# Patient Record
Sex: Female | Born: 1940 | Race: White | Hispanic: No | Marital: Married | Smoking: Former smoker
Health system: Southern US, Community
[De-identification: ages and names within clinical notes are randomized; demographics above are authoritative.]

## PROBLEM LIST (undated history)

## (undated) DIAGNOSIS — I10 Essential (primary) hypertension: Secondary | ICD-10-CM

## (undated) DIAGNOSIS — K5730 Diverticulosis of large intestine without perforation or abscess without bleeding: Secondary | ICD-10-CM

## (undated) DIAGNOSIS — F319 Bipolar disorder, unspecified: Secondary | ICD-10-CM

## (undated) DIAGNOSIS — N189 Chronic kidney disease, unspecified: Secondary | ICD-10-CM

## (undated) DIAGNOSIS — D126 Benign neoplasm of colon, unspecified: Principal | ICD-10-CM

## (undated) DIAGNOSIS — F329 Major depressive disorder, single episode, unspecified: Secondary | ICD-10-CM

## (undated) DIAGNOSIS — F32A Depression, unspecified: Secondary | ICD-10-CM

## (undated) HISTORY — DX: Chronic kidney disease, unspecified: N18.9

## (undated) HISTORY — PX: TOTAL KNEE ARTHROPLASTY: SHX125

## (undated) HISTORY — DX: Depression, unspecified: F32.A

## (undated) HISTORY — DX: Major depressive disorder, single episode, unspecified: F32.9

## (undated) HISTORY — PX: KNEE ARTHROSCOPY: SUR90

## (undated) HISTORY — DX: Bipolar disorder, unspecified: F31.9

## (undated) HISTORY — DX: Benign neoplasm of colon, unspecified: D12.6

## (undated) HISTORY — DX: Diverticulosis of large intestine without perforation or abscess without bleeding: K57.30

## (undated) HISTORY — PX: OTHER SURGICAL HISTORY: SHX169

---

## 1998-02-05 ENCOUNTER — Other Ambulatory Visit: Admission: RE | Admit: 1998-02-05 | Discharge: 1998-02-05 | Payer: Self-pay | Admitting: Obstetrics & Gynecology

## 1999-03-12 ENCOUNTER — Other Ambulatory Visit: Admission: RE | Admit: 1999-03-12 | Discharge: 1999-03-12 | Payer: Self-pay | Admitting: Obstetrics & Gynecology

## 1999-06-28 ENCOUNTER — Ambulatory Visit (HOSPITAL_COMMUNITY): Admission: RE | Admit: 1999-06-28 | Discharge: 1999-06-28 | Payer: Self-pay | Admitting: Obstetrics & Gynecology

## 1999-06-28 ENCOUNTER — Encounter (INDEPENDENT_AMBULATORY_CARE_PROVIDER_SITE_OTHER): Payer: Self-pay

## 2000-04-08 ENCOUNTER — Other Ambulatory Visit: Admission: RE | Admit: 2000-04-08 | Discharge: 2000-04-08 | Payer: Self-pay | Admitting: Obstetrics & Gynecology

## 2000-08-18 ENCOUNTER — Encounter: Payer: Self-pay | Admitting: Family Medicine

## 2000-08-18 ENCOUNTER — Ambulatory Visit (HOSPITAL_COMMUNITY): Admission: RE | Admit: 2000-08-18 | Discharge: 2000-08-18 | Payer: Self-pay | Admitting: Family Medicine

## 2000-10-14 ENCOUNTER — Ambulatory Visit (HOSPITAL_COMMUNITY): Admission: RE | Admit: 2000-10-14 | Discharge: 2000-10-14 | Payer: Self-pay | Admitting: Orthopedic Surgery

## 2000-12-22 ENCOUNTER — Encounter (HOSPITAL_COMMUNITY): Admission: RE | Admit: 2000-12-22 | Discharge: 2001-01-21 | Payer: Self-pay | Admitting: Orthopedic Surgery

## 2001-01-22 ENCOUNTER — Ambulatory Visit (HOSPITAL_COMMUNITY): Admission: RE | Admit: 2001-01-22 | Discharge: 2001-01-22 | Payer: Self-pay | Admitting: Family Medicine

## 2001-01-22 ENCOUNTER — Encounter: Payer: Self-pay | Admitting: Family Medicine

## 2001-02-02 ENCOUNTER — Encounter (HOSPITAL_COMMUNITY): Admission: RE | Admit: 2001-02-02 | Discharge: 2001-03-04 | Payer: Self-pay | Admitting: Orthopedic Surgery

## 2001-02-10 ENCOUNTER — Ambulatory Visit (HOSPITAL_COMMUNITY): Admission: RE | Admit: 2001-02-10 | Discharge: 2001-02-10 | Payer: Self-pay | Admitting: Neurosurgery

## 2001-02-15 ENCOUNTER — Encounter: Payer: Self-pay | Admitting: Neurosurgery

## 2001-02-15 ENCOUNTER — Ambulatory Visit (HOSPITAL_COMMUNITY): Admission: RE | Admit: 2001-02-15 | Discharge: 2001-02-15 | Payer: Self-pay | Admitting: Neurosurgery

## 2001-02-19 ENCOUNTER — Encounter: Payer: Self-pay | Admitting: Neurosurgery

## 2001-02-23 ENCOUNTER — Encounter (INDEPENDENT_AMBULATORY_CARE_PROVIDER_SITE_OTHER): Payer: Self-pay | Admitting: *Deleted

## 2001-02-23 ENCOUNTER — Inpatient Hospital Stay (HOSPITAL_COMMUNITY): Admission: RE | Admit: 2001-02-23 | Discharge: 2001-02-28 | Payer: Self-pay | Admitting: Neurosurgery

## 2001-02-23 ENCOUNTER — Encounter: Payer: Self-pay | Admitting: Neurosurgery

## 2001-05-17 ENCOUNTER — Other Ambulatory Visit: Admission: RE | Admit: 2001-05-17 | Discharge: 2001-05-17 | Payer: Self-pay | Admitting: Obstetrics & Gynecology

## 2002-05-19 ENCOUNTER — Other Ambulatory Visit: Admission: RE | Admit: 2002-05-19 | Discharge: 2002-05-19 | Payer: Self-pay | Admitting: Obstetrics & Gynecology

## 2002-12-27 ENCOUNTER — Encounter (HOSPITAL_COMMUNITY): Admission: RE | Admit: 2002-12-27 | Discharge: 2003-01-26 | Payer: Self-pay | Admitting: Neurosurgery

## 2003-01-27 ENCOUNTER — Encounter (HOSPITAL_COMMUNITY): Admission: RE | Admit: 2003-01-27 | Discharge: 2003-02-26 | Payer: Self-pay | Admitting: Neurosurgery

## 2003-05-30 ENCOUNTER — Other Ambulatory Visit: Admission: RE | Admit: 2003-05-30 | Discharge: 2003-05-30 | Payer: Self-pay | Admitting: Obstetrics & Gynecology

## 2004-06-18 ENCOUNTER — Other Ambulatory Visit: Admission: RE | Admit: 2004-06-18 | Discharge: 2004-06-18 | Payer: Self-pay | Admitting: Obstetrics & Gynecology

## 2004-07-05 ENCOUNTER — Ambulatory Visit: Payer: Self-pay | Admitting: Internal Medicine

## 2004-07-05 ENCOUNTER — Ambulatory Visit (HOSPITAL_COMMUNITY): Admission: RE | Admit: 2004-07-05 | Discharge: 2004-07-05 | Payer: Self-pay | Admitting: Internal Medicine

## 2005-04-02 ENCOUNTER — Encounter: Admission: RE | Admit: 2005-04-02 | Discharge: 2005-04-02 | Payer: Self-pay | Admitting: Neurosurgery

## 2006-04-09 ENCOUNTER — Encounter: Admission: RE | Admit: 2006-04-09 | Discharge: 2006-04-09 | Payer: Self-pay | Admitting: Neurosurgery

## 2006-08-24 ENCOUNTER — Inpatient Hospital Stay (HOSPITAL_COMMUNITY): Admission: RE | Admit: 2006-08-24 | Discharge: 2006-08-27 | Payer: Self-pay | Admitting: Orthopedic Surgery

## 2007-03-10 ENCOUNTER — Encounter: Admission: RE | Admit: 2007-03-10 | Discharge: 2007-03-10 | Payer: Self-pay | Admitting: Neurosurgery

## 2007-07-27 ENCOUNTER — Ambulatory Visit (HOSPITAL_COMMUNITY): Admission: RE | Admit: 2007-07-27 | Discharge: 2007-07-27 | Payer: Self-pay | Admitting: Family Medicine

## 2007-07-29 ENCOUNTER — Encounter: Payer: Self-pay | Admitting: Internal Medicine

## 2007-07-29 ENCOUNTER — Ambulatory Visit (HOSPITAL_COMMUNITY): Admission: RE | Admit: 2007-07-29 | Discharge: 2007-07-29 | Payer: Self-pay | Admitting: Internal Medicine

## 2007-07-29 ENCOUNTER — Ambulatory Visit: Payer: Self-pay | Admitting: Internal Medicine

## 2007-07-29 DIAGNOSIS — K5730 Diverticulosis of large intestine without perforation or abscess without bleeding: Secondary | ICD-10-CM

## 2007-07-29 DIAGNOSIS — D126 Benign neoplasm of colon, unspecified: Secondary | ICD-10-CM

## 2007-07-29 HISTORY — DX: Diverticulosis of large intestine without perforation or abscess without bleeding: K57.30

## 2007-07-29 HISTORY — DX: Benign neoplasm of colon, unspecified: D12.6

## 2008-02-23 ENCOUNTER — Encounter: Admission: RE | Admit: 2008-02-23 | Discharge: 2008-02-23 | Payer: Self-pay | Admitting: Neurosurgery

## 2008-06-05 ENCOUNTER — Ambulatory Visit (HOSPITAL_COMMUNITY): Admission: RE | Admit: 2008-06-05 | Discharge: 2008-06-05 | Payer: Self-pay | Admitting: Family Medicine

## 2008-12-05 ENCOUNTER — Encounter (HOSPITAL_COMMUNITY): Admission: RE | Admit: 2008-12-05 | Discharge: 2008-12-07 | Payer: Self-pay | Admitting: Family Medicine

## 2009-08-23 ENCOUNTER — Encounter: Admission: RE | Admit: 2009-08-23 | Discharge: 2009-08-23 | Payer: Self-pay | Admitting: Neurosurgery

## 2010-01-09 DIAGNOSIS — IMO0002 Reserved for concepts with insufficient information to code with codable children: Secondary | ICD-10-CM | POA: Insufficient documentation

## 2010-01-09 DIAGNOSIS — M949 Disorder of cartilage, unspecified: Secondary | ICD-10-CM

## 2010-01-09 DIAGNOSIS — M899 Disorder of bone, unspecified: Secondary | ICD-10-CM | POA: Insufficient documentation

## 2010-01-14 ENCOUNTER — Ambulatory Visit: Payer: Self-pay | Admitting: Internal Medicine

## 2010-01-14 DIAGNOSIS — K5900 Constipation, unspecified: Secondary | ICD-10-CM

## 2010-03-10 HISTORY — PX: COLONOSCOPY: SHX174

## 2010-04-01 ENCOUNTER — Encounter: Payer: Self-pay | Admitting: Family Medicine

## 2010-04-09 ENCOUNTER — Telehealth (INDEPENDENT_AMBULATORY_CARE_PROVIDER_SITE_OTHER): Payer: Self-pay

## 2010-04-11 NOTE — Assessment & Plan Note (Signed)
Summary: CONSTIPATION/JBB   Visit Type:  Initial Visit Primary Care Provider:  Mickie Hillier  CC:  constipation.  History of Present Illness: [name] is a pleasant 70 year old female who presents today with concerns regarding constipation. She states it started slowly over the past few months. She states that she felt like she was not evacuating completely; she had a very small BM daily, but still felt constipated. She felt "sluggish". Denied abdominal pain or bloating. She added fiber to her diet and started drinking 8-9 glasses of water a day for the past 2 weeks. Now states BM are productive and feels like she is evacuating completely. Tried Miralax and had good results. Reports one episode of a small amount of fresh blood in stool after straining; states she almost didn't see it. Not any on toilet paper. No weight loss. No loss of appetite.  Last colonoscopy Jul 29, 2007 by Dr. Gala Romney, showing normal rectum, left-sided divertiula, pedunculated polyps. Biopsy:benign, repeat in 3 years.  Current Medications (verified): 1)  Lithium 2)  Fosamax  Allergies (verified): 1)  ! * Pain Medicines  Past History:  Past Medical History: Hypotension Depression in past Chemical imbalance (unsure if bipolar or not) TC S Jul 29, 2007 by Dr. Gala Romney, showing normal rectum, left-sided divertiula, pedunculated polyps. Biopsy:benign,  Past Surgical History: knee arthroscopy knee replacement Spinal tumor removal  Family History: Mother:deceased, kidney disease Father: deceased, aortic aneurysm siblings: some type of colitis? No FH of Colon Cancer:  Social History: Patient is a former smoker. many years ago Alcohol Use - no Smoking Status:  quit  Review of Systems General:  Denies fever, chills, and anorexia. Eyes:  Denies blurring, irritation, and discharge. ENT:  Denies sore throat, hoarseness, and difficulty swallowing. CV:  Denies chest pains, dyspnea on exertion, and peripheral  edema. Resp:  Denies dyspnea at rest and wheezing. GI:  Complains of constipation; denies difficulty swallowing, pain on swallowing, nausea, vomiting, and abdominal pain. GU:  Denies urinary burning, blood in urine, and urinary frequency. MS:  Denies joint pain / LOM, joint swelling, and joint stiffness. Derm:  Denies rash, itching, and dry skin. Neuro:  Denies weakness, frequent falls, and headache. Psych:  Denies depression and anxiety. Endo:  Denies cold intolerance and heat intolerance. Heme:  Denies bruising and bleeding.  Vital Signs:  Patient profile:   70 year old female Height:      66 inches Weight:      134.50 pounds BMI:     21.79 Temp:     97.8 degrees F oral Pulse rate:   56 / minute BP sitting:   120 / 78  (left arm) Cuff size:   regular  Vitals Entered By: Waldon Merl LPN (November  7, 624THL 2:06 PM)  Physical Exam  General:  Well developed, well nourished, no acute distress. Head:  Normocephalic and atraumatic. Eyes:  without scleral icterus Mouth:  No deformity or lesions, dentition normal. Lungs:  Clear throughout to auscultation. Heart:  Regular rate and rhythm; no murmurs, rubs,  or bruits. Abdomen:  normal bowel sounds, without guarding, without rebound, no distesion, no tenderness, no masses, and no hepatomegally or splenomegaly.   Msk:  mild kyphosis, SMAE.  Pulses:  Normal pulses noted. Extremities:  No clubbing, cyanosis, edema or deformities noted. Neurologic:  Alert and  oriented x4;  grossly normal neurologically. Skin:  Intact without significant lesions or rashes. Psych:  Alert and cooperative. Normal mood and affect.  Impression & Recommendations:  Problem # 1:  CONSTIPATION (ICD-564.00)  [name] is a pleasant 70 year old female who presented with constipation over the past few months; she states she had a BM daily, but it was small and she felt "sluggish", felt like she was unable to evacuate completely. Denies abdominal pain/bloating,  N/V. Did have a small "streak" of brb in stool which she "barely saw" after straining during a BM. No other incidences of hematochezia, melena. Two weeks ago added fiber to diet, increased fluid intake to 8-9 glasses of water daily. Now states constipation is resolved. Next TCS due in 07/2010.   Colace 100 mg po BID Miralax prn Benefiber daily (samples given) TCS with Dr. Gala Romney in May 2012.  Contact us if constipation returns or any signs of rectal bleeding, abdominal pain, N/V.  Orders: Est. Patient Level III [id number])  Patient Instructions: 1)  Benefiber daily 2)  Colace 100 mg by mouth twice a day (hold if diarrhea) 3)  Use Miralax as needed for constipation 4)  Call us if constipation worsens or continues despite these interventions 5)  Continue drinking 8 glasses of water daily 6)  Continue exercising:)  7)  The medication list was reviewed and reconciled.  All changed / newly prescribed medications were explained.  A complete medication list was provided to the patient / caregiver.  Prescriptions: BENEFIBER  POWD (WHEAT DEXTRIN) 1 tbsp daily  #one month x 3   Entered and Authorized by:   Laban Emperor NP   Signed by:   Laban Emperor NP on 01/15/2010   Method used:   Faxed to ...       [facility]  [address] [phone]* (retail)       [address]       [place], [place]  [address]       Ph: [phone] or [phone]       Fax: [phone]   RxID:   [id number] MIRALAX  POWD (POLYETHYLENE GLYCOL 3350) take 1 capful as needed for constipation daily  #1 month x 3   Entered and Authorized by:   Laban Emperor NP   Signed by:   Laban Emperor NP on 01/15/2010   Method used:   Faxed to ...       [facility]  [address] [phone]* (retail)       [address]       [place], [place]  [address]       Ph: [phone] or [phone]       Fax: [phone]   RxID:   [id number] COLACE 100 MG CAPS (DOCUSATE SODIUM) take 1 by mouth two times a day  #60 x 3   Entered and Authorized by:    Laban Emperor NP   Signed by:   Laban Emperor NP on 01/15/2010   Method used:   Faxed to ...       [facility]  [address] [phone]* (retail)       [address]       [place], [place]  [address]       Ph: [phone] or [phone]       Fax: [phone]   RxID:   [id number]

## 2010-04-17 NOTE — Progress Notes (Addendum)
Summary: constipation  Phone Note Call from Patient Call back at Home Phone [phone]   Caller: Patient Summary of Call: pt called- she has tried all suggestions that AS gave her at her November ov. She is taking benefiber three times a day , colace and miralax daily, drinking 8 glasses of water a day and she swins at the [facility]. wants to know what her next step should be. please advise Initial call taken by: Burnadette Peter LPN,  January 31, [id number] 3:35 PM     Appended Document: constipation let's go ahead and set her up for a colonoscopy. She is due for one in May 2012 anyway. She may need to come back in for an updated H&P.   Appended Document: constipation I tried to call pt to schedule an ov,no answer,lmom.  Appended Document: constipation Per th pts request she is feeling fine and would like to wait until May to have her tcs.Marland Kitchen

## 2010-06-27 ENCOUNTER — Encounter: Payer: Self-pay | Admitting: Internal Medicine

## 2010-07-05 ENCOUNTER — Ambulatory Visit: Payer: Self-pay | Admitting: Urgent Care

## 2010-07-08 ENCOUNTER — Ambulatory Visit (INDEPENDENT_AMBULATORY_CARE_PROVIDER_SITE_OTHER): Payer: Medicare Other | Admitting: Urgent Care

## 2010-07-08 ENCOUNTER — Encounter: Payer: Self-pay | Admitting: Urgent Care

## 2010-07-08 VITALS — BP 138/86 | HR 82 | Temp 98.5°F | Ht 66.0 in | Wt 133.2 lb

## 2010-07-08 DIAGNOSIS — D126 Benign neoplasm of colon, unspecified: Secondary | ICD-10-CM

## 2010-07-08 DIAGNOSIS — K5900 Constipation, unspecified: Secondary | ICD-10-CM

## 2010-07-08 MED ORDER — PEG 3350-KCL-NA BICARB-NACL 420 G PO SOLR: ORAL | Status: AC

## 2010-07-08 NOTE — Assessment & Plan Note (Signed)
Much improved w/ fiber.  Doing well.

## 2010-07-08 NOTE — Progress Notes (Signed)
Primary Care Physician:  Rubbie Battiest, MD, MD Primary Gastroenterologist:  Dr. Gala Romney   Chief Complaint  Patient presents with  . Colon Cancer Screening    HPI:  [name] is a 70 y.o. female here to set up colonoscopy for hx tubular adenoma 2009.  Overall doing quite well.  Hx constipation.  Started benefiber.  No help.  C/o increased abd girth.  Taking metamucil--seems to help.  Denies rectal bleeding or melena.  BM daily. Wt stable.  Appetite ok.       Past Medical History  Diagnosis Date  . Hypotension   . Depression   . Tubular adenoma of colon 07/29/2007    Dr. Faith Rogue colonoscopy, previous focally adenomatous cecal polyp 2006  . Diverticulosis of colon 07/29/2007  . Bipolar affective disorder     Past Surgical History  Procedure Date  . Knee arthroscopy   . Total knee arthroplasty   . Spinal tumor removal     Current Outpatient Prescriptions  Medication Sig Dispense Refill  . alendronate (FOSAMAX) 10 MG tablet Take 10 mg by mouth daily before breakfast. Take with a full glass of water on an empty stomach.       . lithium 150 MG capsule Take 150 mg by mouth 3 (three) times daily with meals.        . Psyllium (METAMUCIL) 30.9 % POWD Take by mouth.        . docusate sodium (COLACE) 100 MG capsule Take 100 mg by mouth 2 (two) times daily.        . polyethylene glycol (MIRALAX / GLYCOLAX) packet Take 17 g by mouth daily.        . polyethylene glycol-electrolytes (TRILYTE) 420 G solution Use as directed Also buy 1 fleet enema & 4 dulcolax tablets to use as directed  4000 mL  0  . Wheat Dextrin (BENEFIBER DRINK MIX) PACK Take by mouth.          Allergies as of 07/08/2010 - Review Complete 07/08/2010  Allergen Reaction Noted  . Morphine and related Nausea And Vomiting 07/03/2010    Family History:There is no known family history of colorectal carcinoma , liver disease, or inflammatory bowel disease.   Problem Relation Age of Onset  . Kidney disease Mother   .  Colitis      ? type in sibling    History   Social History  . Marital Status: Married    Spouse Name: N/A    Number of Children: N/A  . Years of Education: N/A   Occupational History  . retired    Social History Main Topics  . Smoking status: Former Research scientist (life sciences)  . Smokeless tobacco: Not on file  . Alcohol Use: No  . Drug Use: No  . Sexually Active:    Review of Systems: Gen: Denies any fever, chills, sweats, anorexia, fatigue, weakness, malaise, weight loss, and sleep disorder CV: Denies chest pain, angina, palpitations, syncope, orthopnea, PND, peripheral edema, and claudication. Resp: Denies dyspnea at rest, dyspnea with exercise, cough, sputum, wheezing, coughing up blood, and pleurisy. GI: Denies vomiting blood, jaundice, and fecal incontinence.   Denies dysphagia or odynophagia. GU : Denies urinary burning, blood in urine, urinary frequency, urinary hesitancy, nocturnal urination, and urinary incontinence. MS: Denies joint pain, limitation of movement, and swelling, stiffness, low back pain, extremity pain. Denies muscle weakness, cramps, atrophy.  Derm: Denies rash, itching, dry skin, hives, moles, warts, or unhealing ulcers.  Psych: Denies depression, anxiety, memory loss, suicidal ideation,  hallucinations, paranoia, and confusion. Heme: Denies bruising, bleeding, and enlarged lymph nodes.  Physical Exam: BP 138/86  Pulse 82  Temp(Src) 98.5 F (36.9 C) (Tympanic)  Ht 5\' 6"  (1.676 m)  Wt 133 lb 3.2 oz (60.419 kg)  BMI 21.50 kg/m2 General:   Alert,  Well-developed, well-nourished, pleasant and cooperative elderly femalein NAD Head:  Normocephalic and atraumatic. Eyes:  Sclera clear, no icterus.   Conjunctiva pink. Ears:  Normal auditory acuity. Nose:  No deformity, discharge,  or lesions. Mouth:  No deformity or lesions, dentition normal. Neck:  Supple; no masses or thyromegaly. Lungs:  Clear throughout to auscultation.   No wheezes, crackles, or rhonchi. No acute  distress. Heart:  Regular rate and rhythm; no murmurs, clicks, rubs,  or gallops. Abdomen:  Soft, nontender and nondistended. No masses, hepatosplenomegaly or hernias noted. Normal bowel sounds, without guarding, and without rebound.   Rectal:  Deferred until time of colonoscopy.   Msk:  Symmetrical without gross deformities. Pulses:  Normal pulses noted. Extremities:  Without clubbing or edema. Neurologic:  Alert and  oriented x4;  grossly normal neurologically. Skin:  Intact without significant lesions or rashes. Cervical Nodes:  No significant cervical adenopathy. Psych:  Alert and cooperative. Normal mood and affect.

## 2010-07-08 NOTE — Progress Notes (Signed)
Cc to PCP 

## 2010-07-08 NOTE — Assessment & Plan Note (Signed)
[name] is a 70 y/o caucasian female w/ hx tubular adenoma colon 2009 & previous adenomatous polyp due for colonoscopy.  I have discussed risks & benefits which include, but are not limited to, bleeding, infection, perforation & drug reaction.  The patient agrees with this plan & written consent will be obtained.

## 2010-07-23 NOTE — H&P (Signed)
[name], [name]                 ACCOUNT NO.:  [id number]   MEDICAL RECORD NO.:  [id number]          PATIENT TYPE:  INP   LOCATION:  NA                           FACILITY:  [facility]   PHYSICIAN:  Gaynelle Arabian, M.D.    DATE OF BIRTH:  April 09, 1940   DATE OF ADMISSION:  08/24/2006  DATE OF DISCHARGE:                              HISTORY & PHYSICAL   CHIEF COMPLAINT:  Left knee pain.   HISTORY OF PRESENT ILLNESS:  The patient is a 70 year old female who was  seen by Dr. Wynelle Link for ongoing knee pain.  She has known end-stage  arthritis.  She has been treated conservatively in the past including  injections.  She has recently been treated with a series of Synvisc  injections and unfortunately, has had progressive worsening pain.  It is  felt she has reached a point where she could benefit undergoing surgical  intervention.  She would like to proceed with total knee arthroplasty.  Risks and benefits have been discussed.  She elected to proceed with  surgery.   ALLERGIES:  MORPHINE intolerances.  Vomiting with ANESTHESIA in the  past.   CURRENT MEDICATIONS:  Lithium and Fosamax.   PAST MEDICAL HISTORY:  1. Chemical imbalance/depression.  2. Osteopenia.  3. Degenerative disk disease.   PAST SURGICAL HISTORY:  1. Laser eye surgery for the left retina.  2. Arthroscopic left knee surgery.  3. T12 spinal tumor removal, benign, per Dr. Carloyn Manner in 2002.   SOCIAL HISTORY:  Married, former Pharmacist, hospital, nonsmoker, no alcohol.  Two  children.   FAMILY HISTORY:  Mother deceased with history of stroke.  Both parents  with history of arthritis.   REVIEW OF SYSTEMS:  GENERAL:  No fevers, chills, night sweats.  NEUROLOGIC:  History of depression/chemical imbalance, no seizures or  paralysis.  RESPIRATORY:  No shortness of breath, productive cough or  hemoptysis.  CARDIOVASCULAR:  No chest pain, angina, orthopnea.  GI:  No  nausea, vomiting, diarrhea or constipation.  GU:  No dysuria, hematuria  or  discharge.  MUSCULOSKELETAL:  Left knee.   PHYSICAL EXAMINATION:  VITAL SIGNS:  Pulse 80, respirations 14, blood  pressure 136/76.  GENERAL:  A 70 year old, white female, well-nourished, well-developed,  tall frame, thin, alert, oriented and cooperative.  HEENT:  Normocephalic, atraumatic.  Pupils round and reactive.  Oropharynx clear.  EOMs intact.  NECK:  Supple.  CHEST:  Clear.  HEART:  Regular rate and rhythm.  No murmur, S1-S2 noted.  ABDOMEN:  Soft, nontender.  Bowel sounds present.  BREASTS/RECTAL/GENITALIA:  Not done, not pertinent to present illness.  EXTREMITIES:  Left knee shows significant valgus malalignment deformity.  Range of motion of 5-120.  Marked crepitus is noted.   IMPRESSION:  Osteoarthritis, left knee.   PLAN:  The patient is admitted to [facility] to undergo a left  total knee replacement arthroplasty.  Surgery will be performed by Dr.  Gaynelle Arabian.      Alexzandrew L. Perkins, P.A.C.      Gaynelle Arabian, M.D.  Electronically Signed    ALP/MEDQ  D:  08/23/2006  T:  08/24/2006  Job:  [id number]   cc:   Margaretmary Eddy, M.D.  Fax: [phone]

## 2010-07-23 NOTE — Op Note (Signed)
[name], [name]                 ACCOUNT NO.:  [id number]   MEDICAL RECORD NO.:  [id number]          PATIENT TYPE:  AMB   LOCATION:  DAY                           FACILITY:  [facility]   PHYSICIAN:  R. Garfield Cornea, M.D. DATE OF BIRTH:  05-20-1940   DATE OF PROCEDURE:  07/29/2007  DATE OF DISCHARGE:  07/27/2007                               OPERATIVE REPORT   INDICATIONS FOR PROCEDURE:  A 70 year old lady with history of colonic  adenoma 2006.  She is here for surveillance.  This approach has been  discussed with the patient at length.  Potential risks, benefits and  alternatives have been reviewed.  Questions answered.  She is agreeable.  Please see documentation of medical record.   PROCEDURE NOTE:  O2 saturation, blood pressure, pulse, and respirations  were monitored throughout the entire procedure.  Conscious sedation  Versed 4 mg IV and Demerol 75 mg IV divided doses.  Zofran 4 mg IV, the  outset to prophylax against the postprocedure nausea.  Ampicillin 2 g  IV, gentamicin 90 mg IV for a new hip prosthesis less than 4 year old.   INSTRUMENTATION:  Pentax video chip system.   FINDINGS:  Digital rectal exam revealed no abnormalities and a scope was  placed.  The prep was adequate.  Colon:  Colonic mucosa was surveyed  from the rectosigmoid junction through the left transverse right colon  to the area of appendiceal orifice, ileocecal valve, and cecum.  These  structures were well seen and photographed for the record.  From this  level, the scope was slowly cautiously withdrawn.  All previous mucosal  surfaces were again seen.  The patient was noted to have a lobulated  pedunculated 1-cm polyp in the ascending colon up to the ileocecal  valve.  Please see photos.  There was a second 5-mm pedunculated polyp  just downstream of the larger lesion.  A smaller lesion was cold snared  and lost in the recovery process.  The larger polyp was essentially  removed with one pass hot snare  cautery and recovered through the scope.  There was a small amount of residual polyp along the posterior side on  back of the fold on which the polyp was straddling.  This was shaved  down with hot snare cautery and couple of tiny areas were ablated with  the tip of hot snare cautery.  This lesion was felt to have been removed  and destroyed completely.  There was some oozing noted from the center  of the polypectomy crater.  I did not feel additional energy and this  thin walled segment would be the safest approach.  I obtained a single-  resolution clip and applied it to the base of the polypectomy site with  excellent hemostasis.  From this level, the scope was slowly withdrawn  and previously mentioned mucosal surfaces were all once again seen and  no other abnormalities were observed.  The scope was pulled down the  rectum where thorough examination of the rectal mucosa including  retroflexed view of the anal verge demonstrated no abnormalities.  The  patient tolerated the procedure and was reactive to endoscopy.   IMPRESSION:  1. Normal rectum.  2. Left-sided diverticula.  3. Pedunculated polyps about the ileocecal valve treated as described      above.   RECOMMENDATIONS:  1. No arthritis medications for 10 days.  2. Diverticulosis.  Polyp literature provided to [name].  3. No MRI until chip known to have passed.  4. Followup on path.  5. Further recommendations to follow.      Bridgette Habermann, M.D.  Electronically Signed     RMR/MEDQ  D:  07/29/2007  T:  07/29/2007  Job:  [id number]

## 2010-07-23 NOTE — Op Note (Signed)
[name], Lewis                 ACCOUNT NO.:  [id number]   MEDICAL RECORD NO.:  [id number]          PATIENT TYPE:  INP   LOCATION:  X009                         FACILITY:  [facility]   PHYSICIAN:  Gaynelle Arabian, M.D.    DATE OF BIRTH:  1941/03/02   DATE OF PROCEDURE:  08/24/2006  DATE OF DISCHARGE:                               OPERATIVE REPORT   PREOPERATIVE DIAGNOSIS:  Osteoarthritis left knee.   POSTOPERATIVE DIAGNOSIS:  Osteoarthritis left knee.   PROCEDURE:  Left total knee arthroplasty.   SURGEON:  Gaynelle Arabian, M.D.   ASSISTANT:  Arlee Muslim PA-C   ANESTHESIA:  General with postop Marcaine pain pump.   ESTIMATED BLOOD LOSS:  Minimal.   DRAIN:  None.   TOURNIQUET TIME:  38 minutes at 300 mmHg.   COMPLICATIONS:  None.   CONDITION:  Stable to recovery.   BRIEF CLINICAL NOTE:  [name] is a 70 year old female who has end-  stage valgus arthritis of the left knee with progressively worsening  pain dysfunction and deformity.  She has failed nonoperative management  including injections and presents for total knee arthroplasty.   PROCEDURE IN DETAIL:  After successful administration of general  anesthetic, a tourniquet placed on the left thigh and left lower  extremity prepped and draped in usual sterile fashion.  Extremities  wrapped in Esmarch, knee flexed, tourniquet inflated 300 mmHg.  Midline  incision made with 10 blade through subcutaneous tissue to the level of  the extensor mechanism.  Given her significant valgus deformity, we made  a lateral parapatellar arthrotomy.  Soft tissue of the proximal lateral  tibia subperiosteally elevated around the joint line with the knife and  out the posterolateral corner with a Cobb elevator.  Soft tissue  medially was left intact.  Patella was everted medially, knee flexed 90  degrees, ACL, PCL removed.  Drill was used to create a starting hole in  the distal femur and the canal was thoroughly irrigated.  5 degrees  left  valgus alignment guide was placed referencing off the posterior  condyles, rotations marked and the block pinned to remove 10 mm of the  distal femur.  Distal femoral resection is made with an oscillating saw.  Sizing block was placed and size 4 was most appropriate in AP plane but  3 most appropriate medial and lateral.  We did a size 3 cutting block  off the size 4 holes and rotated off the epicondylar axis.  The anterior-  posterior and chamfer cuts were subsequently made.   Tibia subluxed forward and the menisci removed.  Extramedullary tibial  alignment guides placed referencing proximally at the medial aspect of  tibial tubercle and distally along the second metatarsal axis tibial  crest.  The blocks pinned to remove 2 mm off the defective lateral side.  Tibial resection is made with an oscillating saw.  Size 3 is most  appropriate tibial component and the proximal tibia prepared with the  modular drill and keel punch for the size 3.  Femoral preparation is  completed the intercondylar cut.   Size 3 mobile  bearing tibial trial, size 3 posterior stabilized femoral  trial and a 12.5 mm posterior stabilized rotating platform insert trial  are placed.  A 12/05 full extensions achieved with excellent varus  valgus balance throughout full range of motion.  Patella was everted,  thickness measured to be 22 mm.  Freehand resection is taken to 13 mm,  35 template placed, lug holes were drilled, trial patella was placed and  it tracks normally.  Osteophytes removed off the posterior femur with  the trial placed.  All trials removed and the cut bone surfaces were  prepared with pulsatile lavage.  Cement was mixed.  Once ready for  implantation, size 3  mobile bearing tibial tray size 3 posterior  stabilized femur and 35 patella are cemented into place and patella was  held with a clamp.  Trial 12.5-mm inserts placed, knee held in full  extension and all extruded cement removed.  Once  cement was fully  hardened then we thoroughly irrigated the joint pulsatile lavage and  injected FloSeal onto the posterior capsule.  The permanent 12.5 mm  posterior stabilized rotating platform insert is then placed into the  tibial tray.  Wound is copiously irrigated saline solution and the  FloSeal injected into medial lateral gutters and suprapatellar area.  The tourniquet released with total time of 38 minutes.  This moist  sponge is held on the knee and then after two minutes and removed and  minimal bleeding is noted.  Any bleeding identified was stopped with  cautery.  We further irrigated and closed the arthrotomy with  interrupted #1 PDS.  The small area from the superior to inferior pole  of patella was left open for a mini release.  Patella tracks normally  and flexion against gravity to 145 degrees.  Subcu closed interrupted 2-  0 Vicryl subcuticular running 4-0 Monocryl.  Catheter for Marcaine pain  pump is placed and the pump was initiated.  Steri-Strips and bulky  sterile dressing are applied.  She is placed into a knee immobilizer,  awakened, transported to recovery in stable condition.      Gaynelle Arabian, M.D.  Electronically Signed     FA/MEDQ  D:  08/24/2006  T:  08/25/2006  Job:  [id number]

## 2010-07-26 NOTE — Op Note (Signed)
[place]. [facility]  Patient:    [name], [name] Visit Number: [id number] MRN: [id number]          Service Type: SUR Location: [place] 05 Attending Physician:  Melton Krebs Dictated by:   Elizabeth Sauer, M.D. Proc. Date: 02/23/01 Admit Date:  02/23/2001                             Operative Report  PREOPERATIVE DIAGNOSIS:  Right-sided T12-L1 tumor.  POSTOPERATIVE DIAGNOSIS:  Right-sided T12-L1 tumor.  OPERATION: T12-L1 laminectomy for tumor resection.  SURGEON:   Elizabeth Sauer, M.D.  DOCTOR ASSISTANT:  Zigmund Daniel. Joya Salm, M.D.  NURSE ASSISTANT:  [place]  SERVICE:  Neurosurgery.  ANESTHESIA:  General endotracheal anesthesia.  PREP:    In sterile manner and scrubbed with alcohol wipe.  COMPLICATIONS:  None.  INDICATIONS:  This is a 70 year old right-handed white lady with T12-L1 tumor extramedullary intradural on the right side.  DESCRIPTION OF PROCEDURE:  She was taken to the operating suite, intubated, and placed prone on the operating table.  Following shave, prep, and drape in the usual sterile fashion, skin was infiltrated with 1% lidocaine with 4:100,000 epinephrine.  A mark was placed and intraoperative x-ray obtained to confirm correctness of level of incision.  The incision was made from the bottom of L1 to mid T11.  The lamina were uncovered, and the laminectomy of L1, T12, and part of T11 was carried out.  Hemostasis assured.  The dura was exposed, and intraoperative ultrasound was used to locate the tumor.  The dura was then opened and tacked back with 4-0 Nurolon.  The tumor was identified. The dentate ligament had a stitch placed in two placed to slightly rotate the cord in a counterclockwise fashion, looking from the bottom up.  This isolated the tumor.  The tumor was then resected piecemeal and a piece sent off for frozen that was found to be a meningioma.  The tumor base was identified and carefully coagulated.  Meticulous  hemostasis was assured.  The wound was irrigated and hemostasis assured. The dura was closed in watertight fashion with 6-0 Prolene in a running fashion.  Valsalva did not show any CSF leak. meticulous hemostasis of the epidural space was then undertaken.  The wound was copiously irrigated.  Hemostasis was assured.  The paraspinous muscles were reapproximated with Vicryl in interrupted fashion.  Subcutaneous tissues were reapproximated with 0 Vicryl in interrupted fashion.  Subcuticular tissues were reapproximated with 3-0 Vicryl in interrupted fashion.  The skin was closed with 3-0 nylon in running lock fashion.  Betadine and Telfa dressing were applied.  The dissection of the tumor itself had been carried out under the microscope using microdissection technique.  Dictated by:   Elta Guadeloupe . Carloyn Manner, M.D. Attending Physician:  Melton Krebs DD:  02/23/01 TD:  02/23/01 Job: [id number]

## 2010-07-26 NOTE — Op Note (Signed)
[name], [name]                 ACCOUNT NO.:  [id number]   MEDICAL RECORD NO.:  [id number]          PATIENT TYPE:  AMB   LOCATION:  DAY                           FACILITY:  [facility]   PHYSICIAN:  R. Garfield Cornea, M.D. DATE OF BIRTH:  [birth year]   DATE OF PROCEDURE:  07/05/2004  DATE OF DISCHARGE:                                 OPERATIVE REPORT   PROCEDURE:  Colonoscopy with biopsy and snare polypectomy.   INDICATION FOR PROCEDURE:  The patient is a 70 year old Caucasian female  referred over by W. Rosemary Holms, M.D., for colorectal cancer screening.  She has never had a colonoscopy.  She has no lower GI tract symptoms.  There  is no family history of colorectal neoplasia.  Colonoscopy is now being  discussed as a screening maneuver.  This approach has been discussed with  the patient at length, the potential risks, benefits, and alternatives have  been reviewed, questions answered.  She is agreeable.  Please see the  documentation in the medical record.   PROCEDURE NOTE:  O2 saturation, blood pressure, pulse, and respiration were  monitored throughout the entire procedure.   CONSCIOUS SEDATION:  Versed 4 mg IV, Demerol 75 mg IV.  Antibiotic dose is  125 mg IV prior to the procedure, and Benadryl 25 mg IV for skin streaking  at the IV site.   INSTRUMENT USED:  Olympus video chip system (pediatric colonoscope).   FINDINGS:  Digital exam revealed no abnormalities.  Endoscopic findings:  Prep was adequate.   Rectum:  Examination of the rectal mucosa including a retroflexed view of  the anal verge revealed no abnormalities.   Colon:  The colonic mucosa was surveyed from the rectosigmoid junction  through the left, transverse and right colon to the area of the appendiceal  orifice, ileocecal valve and cecum.  These structures were well-seen and  photographed for the record.  From this level the scope was slowly withdrawn  and all previously-mentioned mucosal surfaces were again  seen.  The patient  was noted to have a 6 mm polyp on a stalk opposite the ileocecal valve of  the cecum.  It was removed with snare and recovered through the scope.  There was also a 4 mm diminutive polyp at the rectosigmoid at 20 cm, which  was cold biopsied/removed.  The patient also had left-sided diverticula.  The remainder of the colonic mucosa appeared normal.  The patient tolerated  the procedure well and was reacted in endoscopy.   IMPRESSION:  1.  Normal rectum.  2.  Diminutive rectosigmoid polyp, removed with cold biopsy forceps.  3.  Pedunculated polyp opposite ileocecal valve at the cecum, removed with      snare cautery as described above.  4.  Left-sided diverticula.  5.  The remainder of the colonic mucosa appeared normal.   RECOMMENDATIONS:  1.  Diverticulosis literature provided to [name].  2.  No aspirin or arthritis medication for the next 10 days.  3.  Follow up on pathology.  4.  Further recommendations to follow.      RMR/MEDQ  D:  07/05/2004  T:  07/05/2004  Job:  [id number]   cc:   Margaretmary Eddy, M.D.  [address] [place] [address]  Fax: [phone]

## 2010-07-26 NOTE — Op Note (Signed)
[facility]  Patient:    [name], [name]                        MRN: [id number] Proc. Date: 06/28/99 Adm. Date:  [id number] Attending:  [place] Dec                           Operative Report  PREOPERATIVE DIAGNOSES:       1. Postmenopausal bleeding on hormone replacement                                  therapy.                               2. Suspected polyp as noted on previous ultrasound.  POSTOPERATIVE DIAGNOSES:      1. Postmenopausal bleeding on hormone replacement                                  therapy.                               2. Suspected polyp as noted on previous ultrasound.                               3. Pathology pending.  PROCEDURE:                    Examination under anesthesia and dilatation and curettage.  SURGEON:                      Cristopher Estimable. Stann Mainland, M.D.  ANESTHESIA:                   Intravenous conscious sedation, paracervical block with 4% Xylocaine without epinephrine.  PROCEDURE:                    Patient was taken to the operating room where after the induction of intravenous conscious sedation she was prepped and draped having been placed in the modified lithotomy position in short island stirrups. Bladder was drained of clear urine during the prep.                                After she was appropriately draped, examination under anesthesia was carried out with findings consistent with a mobile, normal sized  uterus and no unusual adnexal masses were felt.                                At this time a speculum was placed and a single tooth tenaculum placed on the anterior lip of the cervix and a paracervical block was  carried out using approximately 12 cc of 1% Xylocaine without epinephrine. Thereafter, the internal os of the cervix was dilated gently to a # 23 Pratt dilator and thereafter using the Randall stone forceps the cavity was probed and what appeared to be a 1 cm polyp was  removed.  At this time  using the small serrated curette, the walls of the uterus were curetted in a general, thorough, and systematic fashion.  Minimal tissue was produced.  The site where the polyp was  attached on the posterior wall of the uterus was curetted well and was almost smooth after conclusion of the sampling.  All specimens were sent labeled "endometrial curetting-probably polyp included."  At this time all instruments ere removed.  Patient was again examined and thereafter awakened and transported to  recovery room in satisfactory condition having tolerated procedure well.                                Patient will return to the office in approximately two weeks time and will remain off of hormone replacement therapy.  Further hormone replacement therapy options will be discussed on her follow-up in the office in approximately two weeks time.  In view of her recent bone scan it is possible that something like Evista might be considered rather than hormone replacement therapy.                                Patient will be discharged to home and given a detailed instruction sheet and will use Advil or Aleve as needed for discomfort. Condition on arrival in recovery room satisfactory. DD:  06/28/99 TD:  06/29/99 Job: [id number]

## 2010-07-26 NOTE — Discharge Summary (Signed)
[place]. [facility]  Patient:    [name], [name] Visit Number: [id number] MRN: [id number]          Service Type: SUR Location: [id number] Attending Physician:  Melton Krebs Dictated by:   Earleen Newport, M.D. Admit Date:  02/23/2001 Discharge Date: 02/28/2001                             Discharge Summary  ADMISSION DIAGNOSIS:  Spinal tumor.  DISCHARGE DIAGNOSIS:  T12 meningioma.  CONDITION ON DISCHARGE:  Improving.  HOSPITAL COURSE:  The patient is a 70 year old individual who was found to have progressive difficulty with right hip pain without weakness.  She found that pain was worse when she would lay down.  Prednisone seemed to help.  An MRI was performed.  This demonstrated presence of a lesion within the spinal canal at the T12 level.  She was taken to the operating room on February 23, 2001, where she had resected a meningioma from around the area of the conus. Postoperatively, the patient complained of some pain initially which was dysesthetic involving the right lower extremity, and this was treated with Neurontin 300 mg t.i.d.  This seemed to help the pain considerably.  She was also given some Skelaxin for spasms.  At the time of discharge, she is being tapered from Decadron, and is currently at 2 mg b.i.d., which will be tapered for the next two weeks time.  Her incision is clean and dry.  Sutures will be removed in a little over a weeks time.  She is also given a prescription for Neurontin 300 mg t.i.d. for pain control.  She has not required any narcotic pain medication during the last days of the hospitalization.  CONDITION ON DISCHARGE:  Improving. Dictated by:   Earleen Newport, M.D. Attending Physician:  Melton Krebs DD:  02/28/01 TD:  03/01/01 Job: [id number]

## 2010-07-26 NOTE — Discharge Summary (Signed)
Lewis, [name]                 ACCOUNT NO.:  [id number]   MEDICAL RECORD NO.:  [id number]          PATIENT TYPE:  INP   LOCATION:  [place]                         FACILITY:  [facility]   PHYSICIAN:  Gaynelle Arabian, M.D.    DATE OF BIRTH:  15-Apr-1940   DATE OF ADMISSION:  08/24/2006  DATE OF DISCHARGE:  08/27/2006                               DISCHARGE SUMMARY   ADMITTING DIAGNOSES:  1. Osteoarthritis, left knee.  2. Chemical imbalance/depression.  3. Osteopenia.  4. Degenerative disk disease.   DISCHARGE DIAGNOSES:  1. Osteoarthritis, left knee, status post left total knee replacement      arthroplasty.  2. Mild postop blood loss anemia.  3. Osteopenia.  4. Degenerative disk disease.   PROCEDURE:  On August 24, 2006, left total knee surgery by Dr. Wynelle Link,  assistant Alexzandrew L. Perkins, P.A.C.   CONSULTATIONS:  None.   HISTORY OF PRESENT ILLNESS:  [name] is a 70 year old female with end-  stage valgus arthritis of the left knee with progressive worsening pain  dysfunction, failed operative management, now presents for a total knee  arthroplasty.   LABORATORY DATA:  Preop CBC showed a hemoglobin of 14.1, hematocrit  42.4, white cell count 6.  Postop hemoglobin 10.6 up to 11.1.  Last H&H  back down to 9.6 and 28.5.  PT/PTT on admission 12.8 and 32  respectively.  INR 1.0.  Serial pro times followed with PT/INR 25 and  2.1.  Chem panel on admission all within normal limits.  Serial BMETs  were followed.  Electrolytes remained within normal limits.  Preop UA  with small leukocyte esterase, few epithelials, 0-2 rbc's, 0-2 wbc's,  otherwise negative.  Blood group type O+.   EKG on August 17, 2006, in normal sinus rhythm, right superior axis  deviation, pulmonary disease pattern since previous EKG rate is faster  performed by Dr. Dorris Carnes on October 07, 2006, in normal sinus rhythm.   HOSPITAL COURSE:  The patient admitted to [facility] and  tolerated the procedure  well.  Later, she was transferred to recovery  room then orthopedic floor.  She was started on PCA and p.o. analgesics,  pain control following surgery.  She was given 24 hours postoperative IV  antibiotics.  She was given Coumadin for DVT prophylaxis.  She did  pretty well on the evening of surgery and doing pretty well on the  morning of day #1 which is not much sleep.  Marcaine pain pump placed at  the time of surgery accidentally was pulled out.  She started getting up  out of bed with therapy by day #2.  She was doing a little bit better  except for being in the CPM too long on the evening of day #1.  Dressing  was changed.  Incision looked good.  Weaned over to p.o. medications for  the PCA, IVs and also the Foley was discontinued at that time.  Continued to get up with PT and she was up ambulating distances about 70  feet.  Later that afternoon, she went over 100 feet.  She progressed  very well and was ready to go home by the following day of August 27, 2006.   DISPOSITION:  The patient was discharged home on August 27, 2006.   DISCHARGE MEDICATIONS:  Vicodin, Robaxin, Coumadin, Phenergan and Nu-  Iron.   ACTIVITY:  Weightbearing as tolerated, left lower extremity.  Home  health PT, home health nursing with total knee protocol.   FOLLOW UP:  Follow up on Wednesday, July 2.   CONDITION ON DISCHARGE:  Improved.      Alexzandrew L. Perkins, P.A.C.      Gaynelle Arabian, M.D.  Electronically Signed    ALP/MEDQ  D:  09/17/2006  T:  09/18/2006  Job:  [id number]   cc:   Margaretmary Eddy, M.D.  Fax: [phone]

## 2010-07-26 NOTE — H&P (Signed)
[place]. [facility]  Patient:    [name], [name] Visit Number: [id number] MRN: [id number]          Service Type: Attending:  Elizabeth Sauer, M.D. Dictated by:   Elizabeth Sauer, M.D. Adm. Date:  02/23/01                           History and Physical  ADMITTING DIAGNOSIS:  Spine tumor.  SERVICE:  Neurosurgery.  HISTORY OF PRESENT ILLNESS:  A 70 year old right-handed white lady, a couple of months had right hip pain, did not notice any numbness or tingling in her leg but does not have any weakness.  It is worse when she lies down.  Her husband said she had a bout of this about a year ago and it resolved.  She was on prednisone for a while and it seemed to help.  Because of this an MRI was obtained that shows an intraspinal extramedullary lesion at the T12-L1 level that is uniformly enhancing.  This is consistent with either neurofibroma or meningioma.  MEDICAL HISTORY:  Remarkable for a chemical imbalance for which she is on lithium 300 mg t.i.d.  Her other medicine is Fosamax once a week.  ALLERGIES:  No allergies.  SURGICAL HISTORY:  Knee arthroscopy in August 2002.  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  FAMILY HISTORY:  Mom died at 35 and daddy died at 77-and-a-half; causes were not given.  REVIEW OF SYSTEMS:  Remarkable for back pain, neck pain, and anxiety.  PHYSICAL EXAMINATION:  HEENT:  Within normal limits.  NECK:  She seems to have good range of motion of her neck.  CHEST:  Clear.  CARDIAC:  Regular rate and rhythm.  ABDOMEN:  Nontender, no hepatosplenomegaly.  EXTREMITIES:  Without clubbing or cyanosis.  GENITOURINARY:  Deferred.  PERIPHERAL PULSES:  Good.  NEUROLOGIC:  She is awake, alert, and oriented.  Her cranial nerves are intact.  Motor exam shows 5/5 strength throughout the upper and lower extremities.  No current sensory deficit.  Reflexes are 2 at the knees, 2 at the ankles.  Toes  downgoing bilaterally.  Straight leg raise is negative.  LABORATORY DATA:  MRI demonstrates a uniformly-enhancing mass in the spinal canal on the right side.  It appears to be intradural and extramedullary.  In addition on the left side in the extraspinal space there are some enhancing areas.  There were studied and found to not be neurofibromas.  CLINICAL IMPRESSION:  Either neurofibroma or meningioma.  PLAN:  Laminectomy and removal of tumor.  The risks and benefits of this approach have been discussed with her and she wishes to proceed. Dictated by:   Elizabeth Sauer, M.D. Attending:  Elizabeth Sauer, M.D. DD:  02/23/01 TD:  02/23/01 Job: [id number]

## 2010-07-26 NOTE — Op Note (Signed)
[facility]  Patient:    [name], [name]                        MRN: [id number] Proc. Date: 10/14/00 Adm. Date:  [id number] Attending:  Tarri Glenn Page                           Operative Report  PREOPERATIVE DIAGNOSES: 1. Torn lateral meniscus. 2. Osteoarthritis, left knee.  POSTOPERATIVE DIAGNOSES: 1. Torn medial and lateral menisci. 2. Osteoarthritis, left knee.  OPERATION: 1. Left knee arthroscopy with one partial medial and lateral meniscectomy. 2. Shaving of lateral femoral condyle.  SURGEON:  Laurice Record. Aplington, M.D.  ASSISTANT:  Nurse.  ANESTHESIA:  General.  PATHOLOGY AND JUSTIFICATION FOR PROCEDURE:  She has had pain and swelling in her left knee.  Plain x-rays demonstrate lateral compartment narrowing.  MRI has demonstrated torn lateral meniscus.  It is felt that an arthroscopic procedure hopefully will improve her left knee, and she understands that this is not being done for arthritis but is being done for the torn lateral meniscus.  See operative description below for additional findings and description of pathology.  DESCRIPTION OF PROCEDURE:  Satisfactory general anesthesia, pneumatic tourniquet, thigh stabilizer.  The left knee was prepped with Dura-Prep and draped in a sterile field.  Superior medial saline inflow.  Joint fluid came forth out of the joint on puncturing the joint.  First, through an anterolateral portal of the medial compartment, the knee joint was evaluated. There was minimal wear of the medial femoral condyle.  There was a good bit of synovitis anteriorly which I pictured and resected.  She had little tiny tear transverse to the inner rim, the junction, anterior and mid third which I shaved down until smooth.  There was a little irregularity of the posterior curve which I did not feel required treatment.  The remainder of the meniscus was unremarkable.  Looking up in the medial gutter and suprapatellar  area, she had some moderate wear of the patella but nothing that was shavable, and no other abnormalities were noted.  I then reversed portals.  Her ACL was intact. She had a bucket-handle-type tear of the anterior third of the lateral meniscus as well as significant tearing of the entire remaining inner rim. 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  The anterior tear was treated by resection of most of the fragment with scissors and then shaving down the remaining rim with the 3.5 shaver.  Posteriorly, I trimmed the meniscus back to a stable rim with baskets and then shaved it down with the 3.5 shaver as well.  Final pictures were taken.  The knee joint was then irrigated until clear and all fluid possible removed.  The two anterior portals were closed with 4-0 nylon.  Then, 20 cc 0.5% Marcaine with adrenalin and 4 mg of morphine were instilled through the inflow apparatus which was removed and this portal closed with 4-0 nylon as well.  Betadine and Adaptic dry sterile dressing were applied.  Tourniquet was released.  She tolerated the procedure well and was taken to the recovery room in satisfactory condition with no known complications. DD:  10/14/00 TD:  10/15/00 Job: [id number]

## 2010-07-29 ENCOUNTER — Other Ambulatory Visit: Payer: Self-pay | Admitting: Internal Medicine

## 2010-07-29 ENCOUNTER — Encounter: Payer: Medicare Other | Admitting: Internal Medicine

## 2010-07-29 ENCOUNTER — Ambulatory Visit (HOSPITAL_COMMUNITY)
Admission: RE | Admit: 2010-07-29 | Discharge: 2010-07-29 | Disposition: A | Discharge status: Home / Self Care | Payer: Medicare Other | Source: Ambulatory Visit | Attending: Internal Medicine | Admitting: Internal Medicine

## 2010-07-29 DIAGNOSIS — D126 Benign neoplasm of colon, unspecified: Secondary | ICD-10-CM

## 2010-07-29 DIAGNOSIS — Z860100 Personal history of colon polyps, unspecified: Secondary | ICD-10-CM | POA: Insufficient documentation

## 2010-07-29 DIAGNOSIS — K5909 Other constipation: Secondary | ICD-10-CM | POA: Insufficient documentation

## 2010-07-29 DIAGNOSIS — K5900 Constipation, unspecified: Secondary | ICD-10-CM

## 2010-07-29 DIAGNOSIS — K5730 Diverticulosis of large intestine without perforation or abscess without bleeding: Secondary | ICD-10-CM

## 2010-07-30 NOTE — Op Note (Signed)
  [name], [name]                 ACCOUNT NO.:  [id number]  MEDICAL RECORD NO.:  [id number]           PATIENT TYPE:  O  LOCATION:  [place]                          FACILITY:  [facility]  PHYSICIAN:  R. Garfield Cornea, M.D. DATE OF BIRTH:  03-31-1940  DATE OF PROCEDURE:  07/29/2010 DATE OF DISCHARGE:                              OPERATIVE REPORT   PROCEDURE:  Surveillance ileocolonoscopy, colonoscopy with snare polypectomy.  INDICATIONS FOR PROCEDURE:  A 70 year old lady with history of colonic adenomas 2009, here for surveillance.  She has occasional constipation. She had recently started taking Metamucil daily with normalization of bowel function, she has 1 bowel movement daily, has any rectal bleeding, etc.  Colonoscopy is now being done.  Risks, benefits, limitations, alternatives, imponderables have been discussed, questions answered. Please see the documentation in the medical record.  PROCEDURE NOTE:  O2 saturation, blood pressure, pulse, respirations were monitored throughout the entirety of the procedure.  CONSCIOUS SEDATION:  Versed 6 mg IV, Demerol 50 mg IV in divided doses.  INSTRUMENT:  Pentax video chip system.  FINDINGS:  Digital rectal exam revealed no abnormalities.  Endoscopic findings:  Prep was adequate.  Colon:  Colonic mucosa was surveyed from the rectosigmoid junction through the left transverse right colon to the appendiceal orifice, ileocecal valve/cecum.  Terminal ileum was also intubated to 5 cm.  Multiple photographs were taken, however, because of equipment/server failure, the photographs could not be stored.  From this level, scope was slowly and cautiously withdrawn.  All previously mentioned mucosal surfaces were again seen on the way in and there was 8 mm pedunculated polyp at the splenic flexure which was hot snared, removed, and recovered through the scope.  The patient had scattered left-sided diverticula.  Colon was somewhat tortuous,  redundant requiring external abdominal pressure reached the cecum.  The remainder of colonic mucosa appeared normal.  Scope was pulled down the rectum where a thorough examination of rectal mucosa including retroflexion of the anal verge demonstrated no abnormalities.  The patient tolerated the procedure well.  Cecal withdrawal time 7 minutes.  IMPRESSION: 1. Normal rectum. 2. Left-sided diverticula somewhat tortuous colon. 3. Pedunculated polyp, splenic flexure status post hot snare     polypectomy. 4. Normal terminal ileum.  RECOMMENDATIONS: 1. Polyp and diverticulosis literature provided to [name]. 2. Follow up on path. 3. Recommendations to follow.     Bridgette Habermann, M.D.     RMR/MEDQ  D:  07/29/2010  T:  07/29/2010  Job:  [id number]  cc:   Margaretmary Eddy, M.D. [phone]  Electronically Signed by Jannette Spanner M.D. on 07/30/2010 07:40:49 AM

## 2010-12-25 LAB — TYPE AND SCREEN: Antibody Screen: NEGATIVE

## 2010-12-25 LAB — CBC
HCT: 33.3 — ABNORMAL LOW
Hemoglobin: 11.1 — ABNORMAL LOW
Hemoglobin: 9.6 — ABNORMAL LOW
MCHC: 33.2
MCHC: 33.6
MCV: 96
RBC: 2.97 — ABNORMAL LOW
RBC: 3.46 — ABNORMAL LOW
WBC: 8.9

## 2010-12-25 LAB — BASIC METABOLIC PANEL
BUN: 10
BUN: 7
Calcium: 9.6
Chloride: 112
Creatinine, Ser: 1.01
GFR calc Af Amer: >60
Potassium: 3.9
Potassium: 4.1

## 2010-12-25 LAB — ABO/RH: ABO/RH(D): O POS

## 2010-12-25 LAB — PROTIME-INR
INR: 1.1
Prothrombin Time: 14.5
Prothrombin Time: 16.9 — ABNORMAL HIGH

## 2010-12-26 LAB — URINE MICROSCOPIC-ADD ON

## 2010-12-26 LAB — CBC
Hemoglobin: 14.1
Platelets: 199
RDW: 13.7

## 2010-12-26 LAB — URINALYSIS, ROUTINE W REFLEX MICROSCOPIC
Bilirubin Urine: NEGATIVE
Glucose, UA: NEGATIVE
Nitrite: NEGATIVE
Specific Gravity, Urine: 1.012
pH: 7

## 2010-12-26 LAB — COMPREHENSIVE METABOLIC PANEL
ALT: 15
Albumin: 4
Alkaline Phosphatase: 63
Glucose, Bld: 99
Potassium: 4.4
Sodium: 140
Total Protein: 7.2

## 2011-03-15 ENCOUNTER — Encounter (HOSPITAL_COMMUNITY): Payer: Self-pay

## 2011-03-15 ENCOUNTER — Emergency Department (HOSPITAL_COMMUNITY): Payer: Medicare Other

## 2011-03-15 ENCOUNTER — Emergency Department (HOSPITAL_COMMUNITY)
Admission: EM | Admit: 2011-03-15 | Discharge: 2011-03-15 | Disposition: A | Discharge status: Home / Self Care | Payer: Medicare Other | Attending: Emergency Medicine | Admitting: Emergency Medicine

## 2011-03-15 DIAGNOSIS — F319 Bipolar disorder, unspecified: Secondary | ICD-10-CM | POA: Insufficient documentation

## 2011-03-15 DIAGNOSIS — R109 Unspecified abdominal pain: Secondary | ICD-10-CM | POA: Diagnosis not present

## 2011-03-15 DIAGNOSIS — E860 Dehydration: Secondary | ICD-10-CM | POA: Insufficient documentation

## 2011-03-15 DIAGNOSIS — R079 Chest pain, unspecified: Secondary | ICD-10-CM | POA: Diagnosis not present

## 2011-03-15 DIAGNOSIS — T148XXA Other injury of unspecified body region, initial encounter: Secondary | ICD-10-CM | POA: Insufficient documentation

## 2011-03-15 DIAGNOSIS — X58XXXA Exposure to other specified factors, initial encounter: Secondary | ICD-10-CM | POA: Insufficient documentation

## 2011-03-15 DIAGNOSIS — K5730 Diverticulosis of large intestine without perforation or abscess without bleeding: Secondary | ICD-10-CM | POA: Insufficient documentation

## 2011-03-15 DIAGNOSIS — J9819 Other pulmonary collapse: Secondary | ICD-10-CM | POA: Diagnosis not present

## 2011-03-15 DIAGNOSIS — IMO0002 Reserved for concepts with insufficient information to code with codable children: Secondary | ICD-10-CM | POA: Diagnosis not present

## 2011-03-15 DIAGNOSIS — N390 Urinary tract infection, site not specified: Secondary | ICD-10-CM | POA: Diagnosis not present

## 2011-03-15 LAB — CBC
HCT: 41.7 % (ref 36.0–46.0)
MCV: 100 fL (ref 78.0–100.0)
RBC: 4.17 MIL/uL (ref 3.87–5.11)
WBC: 8.4 10*3/uL (ref 4.0–10.5)

## 2011-03-15 LAB — COMPREHENSIVE METABOLIC PANEL
ALT: 9 U/L (ref 0–35)
CO2: 28 mEq/L (ref 19–32)
Calcium: 12 mg/dL — ABNORMAL HIGH (ref 8.4–10.5)
Chloride: 103 mEq/L (ref 96–112)
GFR calc Af Amer: 46 mL/min — ABNORMAL LOW (ref >90)
GFR calc non Af Amer: 40 mL/min — ABNORMAL LOW (ref >90)
Glucose, Bld: 104 mg/dL — ABNORMAL HIGH (ref 70–99)
Sodium: 138 mEq/L (ref 135–145)
Total Bilirubin: 0.3 mg/dL (ref 0.3–1.2)

## 2011-03-15 LAB — DIFFERENTIAL
Eosinophils Relative: 1 % (ref 0–5)
Lymphocytes Relative: 13 % (ref 12–46)
Lymphs Abs: 1.1 10*3/uL (ref 0.7–4.0)
Monocytes Absolute: 0.4 10*3/uL (ref 0.1–1.0)

## 2011-03-15 LAB — URINALYSIS, ROUTINE W REFLEX MICROSCOPIC
Glucose, UA: NEGATIVE mg/dL
Hgb urine dipstick: NEGATIVE
Specific Gravity, Urine: 1.01 (ref 1.005–1.030)
pH: 6.5 (ref 5.0–8.0)

## 2011-03-15 LAB — URINE MICROSCOPIC-ADD ON

## 2011-03-15 MED ORDER — ONDANSETRON HCL 4 MG/2ML IJ SOLN
4.0000 mg | Freq: Once | INTRAMUSCULAR | Status: AC
  Administered 2011-03-15: 4 mg via INTRAVENOUS
  Filled 2011-03-15: qty 2

## 2011-03-15 MED ORDER — KETOROLAC TROMETHAMINE 30 MG/ML IJ SOLN
30.0000 mg | Freq: Once | INTRAMUSCULAR | Status: DC
  Filled 2011-03-15: qty 1

## 2011-03-15 MED ORDER — HYDROCODONE-ACETAMINOPHEN 5-325 MG PO TABS: ORAL_TABLET | ORAL | Status: DC

## 2011-03-15 MED ORDER — PROMETHAZINE HCL 25 MG PO TABS: ORAL_TABLET | ORAL | Status: DC

## 2011-03-15 MED ORDER — HYDROMORPHONE HCL PF 1 MG/ML IJ SOLN
1.0000 mg | Freq: Once | INTRAMUSCULAR | Status: AC
  Administered 2011-03-15: 1 mg via INTRAVENOUS
  Filled 2011-03-15: qty 1

## 2011-03-15 MED ORDER — CEPHALEXIN 500 MG PO CAPS
500.0000 mg | ORAL_CAPSULE | Freq: Once | ORAL | Status: AC
  Administered 2011-03-15: 500 mg via ORAL
  Filled 2011-03-15: qty 1

## 2011-03-15 MED ORDER — SODIUM CHLORIDE 0.9 % IV SOLN
Freq: Once | INTRAVENOUS | Status: AC
  Administered 2011-03-15: 16:00:00 via INTRAVENOUS

## 2011-03-15 MED ORDER — CEPHALEXIN 500 MG PO CAPS: 500.0000 mg | ORAL_CAPSULE | Freq: Four times a day (QID) | ORAL | Status: AC

## 2011-03-15 NOTE — ED Notes (Signed)
Pt presents with right flank pain that radiates to low back since last night. Pt denies n/v/d.

## 2011-03-15 NOTE — ED Provider Notes (Signed)
This chart was scribed for [employer]. Olin Hauser, MD by Zella Ball. The patient was seen in room APA15/APA15 and the patient's care was started at 3:22 PM.  CSN: [id number]  Arrival date & time 03/15/11  1255   First MD Initiated Contact with Patient 03/15/11 1505      Chief Complaint  Patient presents with  . Flank Pain  . Back Pain    (Consider location/radiation/quality/duration/timing/severity/associated sxs/prior treatment) HPI [name] is a 71 y.o. female who presents to the Emergency Department complaining of right flank pain that does not radiate. Pt reports that the pain started yesterday while she was in the kitchen. She has not treated the pain with anything. Pt reports that the pain worsens when she takes a deep breath. She reported some dysuria and is not currently taking any bloodthinners.   PCP Dr. Wolfgang Phoenix Past Medical History  Diagnosis Date  . Hypotension   . Depression   . Tubular adenoma of colon 07/29/2007    Dr. Faith Rogue colonoscopy, previous focally adenomatous cecal polyp 2006  . Diverticulosis of colon 07/29/2007  . Bipolar affective disorder     Past Surgical History  Procedure Date  . Knee arthroscopy   . Total knee arthroplasty   . Spinal tumor removal     Family History  Problem Relation Age of Onset  . Kidney disease Mother   . Colitis      ? type in sibling    History  Substance Use Topics  . Smoking status: Former Research scientist (life sciences)  . Smokeless tobacco: Not on file  . Alcohol Use: No    OB History    Grav Para Term Preterm Abortions TAB SAB Ect Mult Living                  Review of Systems 10 Systems reviewed and are negative for acute change except as noted in the HPI.  Allergies  Morphine and related and Other  Home Medications   Current Outpatient Rx  Name Route Sig Dispense Refill  . ALENDRONATE SODIUM 10 MG PO TABS Oral Take 10 mg by mouth daily before breakfast. Take with a full glass of water on an empty stomach.     .  DOCUSATE SODIUM 100 MG PO CAPS Oral Take 100 mg by mouth 2 (two) times daily.      Marland Kitchen LITHIUM CARBONATE 150 MG PO CAPS Oral Take 150 mg by mouth 3 (three) times daily with meals.      Marland Kitchen POLYETHYLENE GLYCOL 3350 PO PACK Oral Take 17 g by mouth daily.      . PSYLLIUM 30.9 % PO POWD Oral Take by mouth.      . BENEFIBER DRINK MIX PO PACK Oral Take by mouth.        BP 141/75  Pulse 83  Temp(Src) 100 F (37.8 C) (Oral)  Resp 22  Ht 5\' 9"  (1.753 m)  Wt 132 lb (59.875 kg)  BMI 19.49 kg/m2  SpO2 99%  Physical Exam  Nursing note and vitals reviewed. Constitutional: She is oriented to person, place, and time. She appears well-developed and well-nourished. No distress.  HENT:  Head: Normocephalic and atraumatic.  Eyes: Pupils are equal, round, and reactive to light.  Neck: Normal range of motion. Neck supple.  Pulmonary/Chest: Effort normal and breath sounds normal.  Abdominal: There is tenderness (tender to palapation). There is no rebound and no guarding.  Musculoskeletal:       No rib tenderness No deformities No crepitus  Neurological: She is alert and oriented to person, place, and time.  Skin: Skin is warm and dry.  Psychiatric: She has a normal mood and affect. Her behavior is normal.    ED Course  Procedures (including critical care time) 5:17 PM Recheck Pt is dehydrated and lab results indicate a UTI. Flank pain is determined to be unrelated and most probably due to muscle strain.  Results for orders placed during the hospital encounter of 03/15/11  URINALYSIS, ROUTINE W REFLEX MICROSCOPIC      Component Value Range   Color, Urine YELLOW  YELLOW    APPearance CLEAR  CLEAR    Specific Gravity, Urine 1.010  1.005 - 1.030    pH 6.5  5.0 - 8.0    Glucose, UA NEGATIVE  NEGATIVE (mg/dL)   Hgb urine dipstick NEGATIVE  NEGATIVE    Bilirubin Urine NEGATIVE  NEGATIVE    Ketones, ur NEGATIVE  NEGATIVE (mg/dL)   Protein, ur NEGATIVE  NEGATIVE (mg/dL)   Urobilinogen, UA 0.2  0.0 -  1.0 (mg/dL)   Nitrite NEGATIVE  NEGATIVE    Leukocytes, UA TRACE (*) NEGATIVE   CBC      Component Value Range   WBC 8.4  4.0 - 10.5 (K/uL)   RBC 4.17  3.87 - 5.11 (MIL/uL)   Hemoglobin 12.9  12.0 - 15.0 (g/dL)   HCT 41.7  36.0 - 46.0 (%)   MCV 100.0  78.0 - 100.0 (fL)   MCH 30.9  26.0 - 34.0 (pg)   MCHC 30.9  30.0 - 36.0 (g/dL)   RDW 13.9  11.5 - 15.5 (%)   Platelets 199  150 - 400 (K/uL)  DIFFERENTIAL      Component Value Range   Neutrophils Relative 80 (*) 43 - 77 (%)   Neutro Abs 6.7  1.7 - 7.7 (K/uL)   Lymphocytes Relative 13  12 - 46 (%)   Lymphs Abs 1.1  0.7 - 4.0 (K/uL)   Monocytes Relative 5  3 - 12 (%)   Monocytes Absolute 0.4  0.1 - 1.0 (K/uL)   Eosinophils Relative 1  0 - 5 (%)   Eosinophils Absolute 0.1  0.0 - 0.7 (K/uL)   Basophils Relative 0  0 - 1 (%)   Basophils Absolute 0.0  0.0 - 0.1 (K/uL)  COMPREHENSIVE METABOLIC PANEL      Component Value Range   Sodium 138  135 - 145 (mEq/L)   Potassium 4.2  3.5 - 5.1 (mEq/L)   Chloride 103  96 - 112 (mEq/L)   CO2 28  19 - 32 (mEq/L)   Glucose, Bld 104 (*) 70 - 99 (mg/dL)   BUN 24 (*) 6 - 23 (mg/dL)   Creatinine, Ser 1.32 (*) 0.50 - 1.10 (mg/dL)   Calcium 12.0 (*) 8.4 - 10.5 (mg/dL)   Total Protein 7.6  6.0 - 8.3 (g/dL)   Albumin 4.3  3.5 - 5.2 (g/dL)   AST 15  0 - 37 (U/L)   ALT 9  0 - 35 (U/L)   Alkaline Phosphatase 77  39 - 117 (U/L)   Total Bilirubin 0.3  0.3 - 1.2 (mg/dL)   GFR calc non Af Amer 40 (*) >90 (mL/min)   GFR calc Af Amer 46 (*) >90 (mL/min)  URINE MICROSCOPIC-ADD ON      Component Value Range   WBC, UA 7-10  <3 (WBC/hpf)   RBC / HPF 0-2  <3 (RBC/hpf)   Bacteria, UA RARE  RARE    Dg  Chest 2 View  03/15/2011  *RADIOLOGY REPORT*  Clinical Data: Right-sided chest pain.  CHEST - 2 VIEW  Comparison: Chest x-ray 08/16/2004.  Findings: The heart is normal in size.  Stable mild tortuosity of the thoracic aorta.  The right infrahilar region appears prominent and somewhat dense.  It may be a combination  of the right pulmonary artery and the ascending aorta given the slight rotation of the patient.  A follow-up chest x-ray and 3 months is suggested to document stability.  The lungs are clear except for minimal streaky basilar atelectasis.  No edema, effusions or pneumothorax.  The bony thorax is intact.  IMPRESSION:  1.  Slightly prominent right hilum is likely normal structures accentuated by rotation of the patient.  Recommend follow-up chest x-ray in 3 months. 2.  Minimal streaky bibasilar atelectasis otherwise the lungs are clear.  Original Report Authenticated By: P. Kalman Jewels, M.D.    MDM  Patient with right sided flank/back pain that began last night. PE with mild tenderness c/w muscle strain. Labs unremarkable except for UTI. Given IVF, antiemetic and analgesic.Initiated antibiotic therapy. Pt stable in ED with no significant deterioration in condition.The patient appears reasonably screened and/or stabilized for discharge and I doubt any other medical condition or other [facility] requiring further screening, evaluation, or treatment in the ED at this time prior to discharge.  I personally performed the services described in this documentation, which was scribed in my presence. The recorded information has been reviewed and considered.   MDM Reviewed: nursing note and vitals Interpretation: labs and x-ray           Gypsy Balsam. Olin Hauser, MD 03/15/11 1736

## 2011-03-17 DIAGNOSIS — M545 Low back pain: Secondary | ICD-10-CM | POA: Diagnosis not present

## 2011-03-17 DIAGNOSIS — N390 Urinary tract infection, site not specified: Secondary | ICD-10-CM | POA: Diagnosis not present

## 2011-03-18 LAB — URINE CULTURE
Colony Count: NO GROWTH
Culture: NO GROWTH

## 2011-03-19 DIAGNOSIS — M47817 Spondylosis without myelopathy or radiculopathy, lumbosacral region: Secondary | ICD-10-CM | POA: Diagnosis not present

## 2011-03-19 DIAGNOSIS — M4120 Other idiopathic scoliosis, site unspecified: Secondary | ICD-10-CM | POA: Diagnosis not present

## 2011-03-19 DIAGNOSIS — M5137 Other intervertebral disc degeneration, lumbosacral region: Secondary | ICD-10-CM | POA: Diagnosis not present

## 2011-03-27 DIAGNOSIS — F39 Unspecified mood [affective] disorder: Secondary | ICD-10-CM | POA: Diagnosis not present

## 2011-05-08 DIAGNOSIS — L820 Inflamed seborrheic keratosis: Secondary | ICD-10-CM | POA: Diagnosis not present

## 2011-05-08 DIAGNOSIS — D235 Other benign neoplasm of skin of trunk: Secondary | ICD-10-CM | POA: Diagnosis not present

## 2011-06-09 DIAGNOSIS — M47817 Spondylosis without myelopathy or radiculopathy, lumbosacral region: Secondary | ICD-10-CM | POA: Diagnosis not present

## 2011-06-30 DIAGNOSIS — M899 Disorder of bone, unspecified: Secondary | ICD-10-CM | POA: Diagnosis not present

## 2011-06-30 DIAGNOSIS — M949 Disorder of cartilage, unspecified: Secondary | ICD-10-CM | POA: Diagnosis not present

## 2011-06-30 DIAGNOSIS — Z1231 Encounter for screening mammogram for malignant neoplasm of breast: Secondary | ICD-10-CM | POA: Diagnosis not present

## 2011-07-14 ENCOUNTER — Other Ambulatory Visit: Payer: Self-pay | Admitting: Obstetrics & Gynecology

## 2011-07-14 DIAGNOSIS — Z124 Encounter for screening for malignant neoplasm of cervix: Secondary | ICD-10-CM | POA: Diagnosis not present

## 2011-07-19 DIAGNOSIS — H6120 Impacted cerumen, unspecified ear: Secondary | ICD-10-CM | POA: Diagnosis not present

## 2011-07-22 ENCOUNTER — Other Ambulatory Visit: Payer: Self-pay | Admitting: Family Medicine

## 2011-07-22 ENCOUNTER — Ambulatory Visit (HOSPITAL_COMMUNITY)
Admission: RE | Admit: 2011-07-22 | Discharge: 2011-07-22 | Disposition: A | Discharge status: Home / Self Care | Payer: Medicare Other | Source: Ambulatory Visit | Attending: Family Medicine | Admitting: Family Medicine

## 2011-07-22 ENCOUNTER — Other Ambulatory Visit: Payer: Self-pay | Admitting: Obstetrics and Gynecology

## 2011-07-22 DIAGNOSIS — J449 Chronic obstructive pulmonary disease, unspecified: Secondary | ICD-10-CM | POA: Diagnosis not present

## 2011-07-22 DIAGNOSIS — J438 Other emphysema: Secondary | ICD-10-CM | POA: Diagnosis not present

## 2011-07-22 DIAGNOSIS — Q678 Other congenital deformities of chest: Secondary | ICD-10-CM

## 2011-07-22 DIAGNOSIS — R0989 Other specified symptoms and signs involving the circulatory and respiratory systems: Secondary | ICD-10-CM | POA: Diagnosis not present

## 2011-07-22 DIAGNOSIS — Z79899 Other long term (current) drug therapy: Secondary | ICD-10-CM | POA: Diagnosis not present

## 2011-07-22 DIAGNOSIS — J9819 Other pulmonary collapse: Secondary | ICD-10-CM | POA: Insufficient documentation

## 2011-07-22 DIAGNOSIS — J4489 Other specified chronic obstructive pulmonary disease: Secondary | ICD-10-CM | POA: Insufficient documentation

## 2011-07-25 DIAGNOSIS — R259 Unspecified abnormal involuntary movements: Secondary | ICD-10-CM | POA: Diagnosis not present

## 2011-07-25 DIAGNOSIS — E785 Hyperlipidemia, unspecified: Secondary | ICD-10-CM | POA: Diagnosis not present

## 2011-08-11 DIAGNOSIS — Z5181 Encounter for therapeutic drug level monitoring: Secondary | ICD-10-CM | POA: Diagnosis not present

## 2011-08-11 DIAGNOSIS — M5137 Other intervertebral disc degeneration, lumbosacral region: Secondary | ICD-10-CM | POA: Diagnosis not present

## 2011-08-18 DIAGNOSIS — M5380 Other specified dorsopathies, site unspecified: Secondary | ICD-10-CM | POA: Diagnosis not present

## 2011-08-18 DIAGNOSIS — M47817 Spondylosis without myelopathy or radiculopathy, lumbosacral region: Secondary | ICD-10-CM | POA: Diagnosis not present

## 2011-08-18 DIAGNOSIS — M5137 Other intervertebral disc degeneration, lumbosacral region: Secondary | ICD-10-CM | POA: Diagnosis not present

## 2011-08-21 DIAGNOSIS — M129 Arthropathy, unspecified: Secondary | ICD-10-CM | POA: Diagnosis not present

## 2011-08-21 DIAGNOSIS — M5380 Other specified dorsopathies, site unspecified: Secondary | ICD-10-CM | POA: Diagnosis not present

## 2011-08-21 DIAGNOSIS — M5137 Other intervertebral disc degeneration, lumbosacral region: Secondary | ICD-10-CM | POA: Diagnosis not present

## 2011-08-21 DIAGNOSIS — Z880 Allergy status to penicillin: Secondary | ICD-10-CM | POA: Diagnosis not present

## 2011-08-21 DIAGNOSIS — Z79899 Other long term (current) drug therapy: Secondary | ICD-10-CM | POA: Diagnosis not present

## 2011-08-21 DIAGNOSIS — Z886 Allergy status to analgesic agent status: Secondary | ICD-10-CM | POA: Diagnosis not present

## 2011-08-21 DIAGNOSIS — Z883 Allergy status to other anti-infective agents status: Secondary | ICD-10-CM | POA: Diagnosis not present

## 2011-08-21 DIAGNOSIS — M47817 Spondylosis without myelopathy or radiculopathy, lumbosacral region: Secondary | ICD-10-CM | POA: Diagnosis not present

## 2011-09-17 DIAGNOSIS — M5137 Other intervertebral disc degeneration, lumbosacral region: Secondary | ICD-10-CM | POA: Diagnosis not present

## 2011-09-17 DIAGNOSIS — N390 Urinary tract infection, site not specified: Secondary | ICD-10-CM | POA: Diagnosis not present

## 2011-09-22 DIAGNOSIS — N390 Urinary tract infection, site not specified: Secondary | ICD-10-CM | POA: Diagnosis not present

## 2011-09-23 DIAGNOSIS — R300 Dysuria: Secondary | ICD-10-CM | POA: Diagnosis not present

## 2011-09-25 ENCOUNTER — Ambulatory Visit (INDEPENDENT_AMBULATORY_CARE_PROVIDER_SITE_OTHER): Payer: Medicare Other | Admitting: Otolaryngology

## 2011-09-25 DIAGNOSIS — H6120 Impacted cerumen, unspecified ear: Secondary | ICD-10-CM

## 2011-09-25 DIAGNOSIS — H903 Sensorineural hearing loss, bilateral: Secondary | ICD-10-CM | POA: Diagnosis not present

## 2011-09-29 DIAGNOSIS — N342 Other urethritis: Secondary | ICD-10-CM | POA: Diagnosis not present

## 2011-10-17 ENCOUNTER — Encounter (INDEPENDENT_AMBULATORY_CARE_PROVIDER_SITE_OTHER): Payer: Medicare Other | Admitting: Ophthalmology

## 2011-10-17 DIAGNOSIS — H33309 Unspecified retinal break, unspecified eye: Secondary | ICD-10-CM

## 2011-10-17 DIAGNOSIS — H43819 Vitreous degeneration, unspecified eye: Secondary | ICD-10-CM | POA: Diagnosis not present

## 2011-10-17 DIAGNOSIS — H2510 Age-related nuclear cataract, unspecified eye: Secondary | ICD-10-CM | POA: Diagnosis not present

## 2011-10-17 DIAGNOSIS — H35379 Puckering of macula, unspecified eye: Secondary | ICD-10-CM | POA: Diagnosis not present

## 2011-10-21 DIAGNOSIS — M5137 Other intervertebral disc degeneration, lumbosacral region: Secondary | ICD-10-CM | POA: Diagnosis not present

## 2011-11-11 DIAGNOSIS — N342 Other urethritis: Secondary | ICD-10-CM | POA: Diagnosis not present

## 2011-11-14 DIAGNOSIS — Z7983 Long term (current) use of bisphosphonates: Secondary | ICD-10-CM | POA: Diagnosis not present

## 2011-11-14 DIAGNOSIS — IMO0001 Reserved for inherently not codable concepts without codable children: Secondary | ICD-10-CM | POA: Diagnosis not present

## 2011-11-14 DIAGNOSIS — Z883 Allergy status to other anti-infective agents status: Secondary | ICD-10-CM | POA: Diagnosis not present

## 2011-11-14 DIAGNOSIS — M899 Disorder of bone, unspecified: Secondary | ICD-10-CM | POA: Diagnosis not present

## 2011-11-14 DIAGNOSIS — Z885 Allergy status to narcotic agent status: Secondary | ICD-10-CM | POA: Diagnosis not present

## 2011-11-14 DIAGNOSIS — M47817 Spondylosis without myelopathy or radiculopathy, lumbosacral region: Secondary | ICD-10-CM | POA: Diagnosis not present

## 2011-11-14 DIAGNOSIS — M5137 Other intervertebral disc degeneration, lumbosacral region: Secondary | ICD-10-CM | POA: Diagnosis not present

## 2011-11-14 DIAGNOSIS — M4180 Other forms of scoliosis, site unspecified: Secondary | ICD-10-CM | POA: Diagnosis not present

## 2011-11-14 DIAGNOSIS — Z79899 Other long term (current) drug therapy: Secondary | ICD-10-CM | POA: Diagnosis not present

## 2011-11-18 DIAGNOSIS — M5137 Other intervertebral disc degeneration, lumbosacral region: Secondary | ICD-10-CM | POA: Diagnosis not present

## 2011-12-10 DIAGNOSIS — M5137 Other intervertebral disc degeneration, lumbosacral region: Secondary | ICD-10-CM | POA: Diagnosis not present

## 2011-12-10 DIAGNOSIS — M47817 Spondylosis without myelopathy or radiculopathy, lumbosacral region: Secondary | ICD-10-CM | POA: Diagnosis not present

## 2011-12-10 DIAGNOSIS — M5380 Other specified dorsopathies, site unspecified: Secondary | ICD-10-CM | POA: Diagnosis not present

## 2011-12-23 DIAGNOSIS — M5137 Other intervertebral disc degeneration, lumbosacral region: Secondary | ICD-10-CM | POA: Diagnosis not present

## 2011-12-30 DIAGNOSIS — M5380 Other specified dorsopathies, site unspecified: Secondary | ICD-10-CM | POA: Diagnosis not present

## 2011-12-30 DIAGNOSIS — M47817 Spondylosis without myelopathy or radiculopathy, lumbosacral region: Secondary | ICD-10-CM | POA: Diagnosis not present

## 2011-12-30 DIAGNOSIS — M5137 Other intervertebral disc degeneration, lumbosacral region: Secondary | ICD-10-CM | POA: Diagnosis not present

## 2012-01-08 DIAGNOSIS — Z79899 Other long term (current) drug therapy: Secondary | ICD-10-CM | POA: Diagnosis not present

## 2012-01-13 DIAGNOSIS — R351 Nocturia: Secondary | ICD-10-CM | POA: Diagnosis not present

## 2012-01-13 DIAGNOSIS — N189 Chronic kidney disease, unspecified: Secondary | ICD-10-CM | POA: Diagnosis not present

## 2012-01-22 DIAGNOSIS — M5137 Other intervertebral disc degeneration, lumbosacral region: Secondary | ICD-10-CM | POA: Diagnosis not present

## 2012-01-30 DIAGNOSIS — R3129 Other microscopic hematuria: Secondary | ICD-10-CM | POA: Diagnosis not present

## 2012-01-30 DIAGNOSIS — K5730 Diverticulosis of large intestine without perforation or abscess without bleeding: Secondary | ICD-10-CM | POA: Diagnosis not present

## 2012-01-30 DIAGNOSIS — N200 Calculus of kidney: Secondary | ICD-10-CM | POA: Diagnosis not present

## 2012-01-30 DIAGNOSIS — N189 Chronic kidney disease, unspecified: Secondary | ICD-10-CM | POA: Diagnosis not present

## 2012-03-31 ENCOUNTER — Encounter: Payer: Self-pay | Admitting: *Deleted

## 2012-04-01 ENCOUNTER — Ambulatory Visit (INDEPENDENT_AMBULATORY_CARE_PROVIDER_SITE_OTHER): Payer: Medicare Other | Admitting: Cardiovascular Disease

## 2012-04-01 ENCOUNTER — Encounter: Payer: Self-pay | Admitting: *Deleted

## 2012-04-01 ENCOUNTER — Encounter: Payer: Self-pay | Admitting: Cardiovascular Disease

## 2012-04-01 VITALS — BP 150/100 | HR 88 | Ht 66.0 in | Wt 131.0 lb

## 2012-04-01 DIAGNOSIS — R079 Chest pain, unspecified: Secondary | ICD-10-CM | POA: Diagnosis not present

## 2012-04-01 NOTE — Patient Instructions (Addendum)
Your physician recommends that you schedule a follow-up appointment in: As needed  Your physician has requested that you have a lexiscan myoview. For further information please visit [email or web address]. Please follow instruction sheet, as given.
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
## 2012-04-01 NOTE — Assessment & Plan Note (Signed)
Etiology not clear but no history of issues.  ECG is ok  She needs stress test but cannot walk on treadmill [employer]

## 2012-04-02 ENCOUNTER — Telehealth: Payer: Self-pay | Admitting: Cardiovascular Disease

## 2012-04-02 NOTE — Telephone Encounter (Signed)
Patient would like to know the details of the "stress test".Marland Kitchen [name]

## 2012-04-02 NOTE — Telephone Encounter (Signed)
Testing described to patient and verbalized understanding.

## 2012-04-07 ENCOUNTER — Other Ambulatory Visit (HOSPITAL_COMMUNITY): Payer: Medicare Other

## 2012-04-08 ENCOUNTER — Encounter (HOSPITAL_COMMUNITY)
Admission: RE | Admit: 2012-04-08 | Discharge: 2012-04-08 | Disposition: A | Discharge status: Home / Self Care | Payer: Medicare Other | Source: Ambulatory Visit | Attending: Cardiovascular Disease | Admitting: Cardiovascular Disease

## 2012-04-08 ENCOUNTER — Encounter (HOSPITAL_COMMUNITY): Payer: Self-pay | Admitting: Cardiovascular Disease

## 2012-04-08 ENCOUNTER — Ambulatory Visit (HOSPITAL_COMMUNITY)
Admission: RE | Admit: 2012-04-08 | Discharge: 2012-04-08 | Disposition: A | Discharge status: Home / Self Care | Payer: Medicare Other | Source: Ambulatory Visit | Attending: Cardiovascular Disease | Admitting: Cardiovascular Disease

## 2012-04-08 ENCOUNTER — Encounter (HOSPITAL_COMMUNITY): Payer: Self-pay

## 2012-04-08 DIAGNOSIS — R079 Chest pain, unspecified: Secondary | ICD-10-CM | POA: Insufficient documentation

## 2012-04-08 MED ORDER — REGADENOSON 0.4 MG/5ML IV SOLN
INTRAVENOUS | Status: AC
  Administered 2012-04-08: 0.4 mg via INTRAVENOUS
  Filled 2012-04-08: qty 5

## 2012-04-08 MED ORDER — TECHNETIUM TC 99M SESTAMIBI - CARDIOLITE
30.0000 | Freq: Once | INTRAVENOUS | Status: AC | PRN
  Administered 2012-04-08: 30 via INTRAVENOUS

## 2012-04-08 MED ORDER — TECHNETIUM TC 99M SESTAMIBI - CARDIOLITE
10.0000 | Freq: Once | INTRAVENOUS | Status: AC | PRN
  Administered 2012-04-08: 10:00:00 9.2 via INTRAVENOUS

## 2012-04-08 MED ORDER — SODIUM CHLORIDE 0.9 % IJ SOLN
INTRAMUSCULAR | Status: AC
  Administered 2012-04-08: 10 mL via INTRAVENOUS
  Filled 2012-04-08: qty 10

## 2012-04-08 NOTE — Progress Notes (Signed)
Stress Lab Nurses Notes - [name]  [name] 04/08/2012 Reason for doing test: Chest Pain Type of test: Wille Glaser Nurse performing test: Gerrit Halls, RN Nuclear Medicine Tech: Melburn Hake Echo Tech: Not Applicable MD performing test: P. Nishan & Jory Sims NP Family MD: Mickie Hillier Test explained and consent signed: yes IV started: 22g jelco, Saline lock flushed, No redness or edema and Saline lock started in radiology Symptoms: Nausea & Headache Treatment/Intervention: None Reason test stopped: protocol completed After recovery IV was: Discontinued via X-ray tech and No redness or edema Patient to return to Nuc. Med at : 12:00 Patient discharged: Home Patient's Condition upon discharge was: stable Comments: During test BP 156/95 & HR 111.  Recovery BP 185/99 & HR 88.  Symptoms resolved in recovery. [name] T

## 2012-04-19 DIAGNOSIS — N183 Chronic kidney disease, stage 3 unspecified: Secondary | ICD-10-CM | POA: Diagnosis not present

## 2012-04-20 DIAGNOSIS — N289 Disorder of kidney and ureter, unspecified: Secondary | ICD-10-CM | POA: Diagnosis not present

## 2012-04-20 DIAGNOSIS — I10 Essential (primary) hypertension: Secondary | ICD-10-CM | POA: Diagnosis not present

## 2012-05-07 DIAGNOSIS — N185 Chronic kidney disease, stage 5: Secondary | ICD-10-CM | POA: Diagnosis not present

## 2012-05-12 DIAGNOSIS — M5137 Other intervertebral disc degeneration, lumbosacral region: Secondary | ICD-10-CM | POA: Diagnosis not present

## 2012-05-12 DIAGNOSIS — M47817 Spondylosis without myelopathy or radiculopathy, lumbosacral region: Secondary | ICD-10-CM | POA: Diagnosis not present

## 2012-05-27 DIAGNOSIS — M1990 Unspecified osteoarthritis, unspecified site: Secondary | ICD-10-CM | POA: Diagnosis not present

## 2012-05-27 DIAGNOSIS — N289 Disorder of kidney and ureter, unspecified: Secondary | ICD-10-CM | POA: Diagnosis not present

## 2012-05-27 DIAGNOSIS — N959 Unspecified menopausal and perimenopausal disorder: Secondary | ICD-10-CM | POA: Diagnosis not present

## 2012-05-27 DIAGNOSIS — G894 Chronic pain syndrome: Secondary | ICD-10-CM | POA: Diagnosis not present

## 2012-05-28 DIAGNOSIS — I129 Hypertensive chronic kidney disease with stage 1 through stage 4 chronic kidney disease, or unspecified chronic kidney disease: Secondary | ICD-10-CM | POA: Diagnosis not present

## 2012-05-28 DIAGNOSIS — N200 Calculus of kidney: Secondary | ICD-10-CM | POA: Diagnosis not present

## 2012-06-01 DIAGNOSIS — N200 Calculus of kidney: Secondary | ICD-10-CM | POA: Diagnosis not present

## 2012-06-24 DIAGNOSIS — R351 Nocturia: Secondary | ICD-10-CM | POA: Diagnosis not present

## 2012-06-24 DIAGNOSIS — N390 Urinary tract infection, site not specified: Secondary | ICD-10-CM | POA: Diagnosis not present

## 2012-06-24 DIAGNOSIS — R3129 Other microscopic hematuria: Secondary | ICD-10-CM | POA: Diagnosis not present

## 2012-06-30 DIAGNOSIS — Z1231 Encounter for screening mammogram for malignant neoplasm of breast: Secondary | ICD-10-CM | POA: Diagnosis not present

## 2012-07-01 DIAGNOSIS — I10 Essential (primary) hypertension: Secondary | ICD-10-CM | POA: Diagnosis not present

## 2012-07-14 DIAGNOSIS — R350 Frequency of micturition: Secondary | ICD-10-CM | POA: Diagnosis not present

## 2012-07-14 DIAGNOSIS — R300 Dysuria: Secondary | ICD-10-CM | POA: Diagnosis not present

## 2012-07-14 DIAGNOSIS — R351 Nocturia: Secondary | ICD-10-CM | POA: Diagnosis not present

## 2012-07-14 DIAGNOSIS — N390 Urinary tract infection, site not specified: Secondary | ICD-10-CM | POA: Diagnosis not present

## 2012-07-14 DIAGNOSIS — N200 Calculus of kidney: Secondary | ICD-10-CM | POA: Diagnosis not present

## 2012-07-19 DIAGNOSIS — N951 Menopausal and female climacteric states: Secondary | ICD-10-CM | POA: Diagnosis not present

## 2012-07-27 DIAGNOSIS — M4120 Other idiopathic scoliosis, site unspecified: Secondary | ICD-10-CM | POA: Diagnosis not present

## 2012-07-27 DIAGNOSIS — F39 Unspecified mood [affective] disorder: Secondary | ICD-10-CM | POA: Diagnosis not present

## 2012-08-26 DIAGNOSIS — N289 Disorder of kidney and ureter, unspecified: Secondary | ICD-10-CM | POA: Diagnosis not present

## 2012-08-26 DIAGNOSIS — E7800 Pure hypercholesterolemia, unspecified: Secondary | ICD-10-CM | POA: Diagnosis not present

## 2012-08-26 DIAGNOSIS — M4180 Other forms of scoliosis, site unspecified: Secondary | ICD-10-CM | POA: Diagnosis not present

## 2012-08-26 DIAGNOSIS — M159 Polyosteoarthritis, unspecified: Secondary | ICD-10-CM | POA: Diagnosis not present

## 2012-09-29 DIAGNOSIS — N189 Chronic kidney disease, unspecified: Secondary | ICD-10-CM | POA: Diagnosis not present

## 2012-09-29 DIAGNOSIS — R3989 Other symptoms and signs involving the genitourinary system: Secondary | ICD-10-CM | POA: Diagnosis not present

## 2012-09-29 DIAGNOSIS — N390 Urinary tract infection, site not specified: Secondary | ICD-10-CM | POA: Diagnosis not present

## 2012-10-22 ENCOUNTER — Ambulatory Visit (INDEPENDENT_AMBULATORY_CARE_PROVIDER_SITE_OTHER): Payer: Medicare Other | Admitting: Ophthalmology

## 2012-10-28 ENCOUNTER — Ambulatory Visit (INDEPENDENT_AMBULATORY_CARE_PROVIDER_SITE_OTHER): Payer: Medicare Other | Admitting: Ophthalmology

## 2012-10-28 DIAGNOSIS — H33309 Unspecified retinal break, unspecified eye: Secondary | ICD-10-CM | POA: Diagnosis not present

## 2012-10-28 DIAGNOSIS — H2510 Age-related nuclear cataract, unspecified eye: Secondary | ICD-10-CM

## 2012-10-28 DIAGNOSIS — H43819 Vitreous degeneration, unspecified eye: Secondary | ICD-10-CM | POA: Diagnosis not present

## 2012-11-09 DIAGNOSIS — J029 Acute pharyngitis, unspecified: Secondary | ICD-10-CM | POA: Diagnosis not present

## 2012-11-09 DIAGNOSIS — N3000 Acute cystitis without hematuria: Secondary | ICD-10-CM | POA: Diagnosis not present

## 2012-11-09 DIAGNOSIS — R300 Dysuria: Secondary | ICD-10-CM | POA: Diagnosis not present

## 2012-11-23 DIAGNOSIS — N3000 Acute cystitis without hematuria: Secondary | ICD-10-CM | POA: Diagnosis not present

## 2012-12-22 DIAGNOSIS — Z23 Encounter for immunization: Secondary | ICD-10-CM | POA: Diagnosis not present

## 2013-01-10 DIAGNOSIS — J40 Bronchitis, not specified as acute or chronic: Secondary | ICD-10-CM | POA: Diagnosis not present

## 2013-01-10 DIAGNOSIS — R05 Cough: Secondary | ICD-10-CM | POA: Diagnosis not present

## 2013-03-09 DIAGNOSIS — R300 Dysuria: Secondary | ICD-10-CM | POA: Diagnosis not present

## 2013-03-21 DIAGNOSIS — F39 Unspecified mood [affective] disorder: Secondary | ICD-10-CM | POA: Diagnosis not present

## 2013-03-21 DIAGNOSIS — M4120 Other idiopathic scoliosis, site unspecified: Secondary | ICD-10-CM | POA: Diagnosis not present

## 2013-04-04 DIAGNOSIS — Z96659 Presence of unspecified artificial knee joint: Secondary | ICD-10-CM | POA: Diagnosis not present

## 2013-04-04 DIAGNOSIS — M25569 Pain in unspecified knee: Secondary | ICD-10-CM | POA: Diagnosis not present

## 2013-06-02 DIAGNOSIS — R82998 Other abnormal findings in urine: Secondary | ICD-10-CM | POA: Diagnosis not present

## 2013-06-02 DIAGNOSIS — Z862 Personal history of diseases of the blood and blood-forming organs and certain disorders involving the immune mechanism: Secondary | ICD-10-CM | POA: Diagnosis not present

## 2013-06-02 DIAGNOSIS — Z8639 Personal history of other endocrine, nutritional and metabolic disease: Secondary | ICD-10-CM | POA: Diagnosis not present

## 2013-06-02 DIAGNOSIS — N183 Chronic kidney disease, stage 3 unspecified: Secondary | ICD-10-CM | POA: Diagnosis not present

## 2013-06-09 DIAGNOSIS — R300 Dysuria: Secondary | ICD-10-CM | POA: Diagnosis not present

## 2013-06-14 ENCOUNTER — Emergency Department (HOSPITAL_COMMUNITY)
Admission: EM | Admit: 2013-06-14 | Discharge: 2013-06-14 | Disposition: A | Discharge status: Home / Self Care | Payer: No Typology Code available for payment source | Attending: Emergency Medicine | Admitting: Emergency Medicine

## 2013-06-14 ENCOUNTER — Emergency Department (HOSPITAL_COMMUNITY): Payer: No Typology Code available for payment source

## 2013-06-14 ENCOUNTER — Encounter (HOSPITAL_COMMUNITY): Payer: Self-pay | Admitting: Emergency Medicine

## 2013-06-14 DIAGNOSIS — Y92410 Unspecified street and highway as the place of occurrence of the external cause: Secondary | ICD-10-CM | POA: Insufficient documentation

## 2013-06-14 DIAGNOSIS — Z87891 Personal history of nicotine dependence: Secondary | ICD-10-CM | POA: Diagnosis not present

## 2013-06-14 DIAGNOSIS — S298XXA Other specified injuries of thorax, initial encounter: Secondary | ICD-10-CM | POA: Diagnosis not present

## 2013-06-14 DIAGNOSIS — Y9389 Activity, other specified: Secondary | ICD-10-CM | POA: Diagnosis not present

## 2013-06-14 DIAGNOSIS — R071 Chest pain on breathing: Secondary | ICD-10-CM | POA: Diagnosis not present

## 2013-06-14 DIAGNOSIS — F319 Bipolar disorder, unspecified: Secondary | ICD-10-CM | POA: Insufficient documentation

## 2013-06-14 DIAGNOSIS — S2220XA Unspecified fracture of sternum, initial encounter for closed fracture: Secondary | ICD-10-CM | POA: Diagnosis not present

## 2013-06-14 DIAGNOSIS — Z8679 Personal history of other diseases of the circulatory system: Secondary | ICD-10-CM | POA: Insufficient documentation

## 2013-06-14 DIAGNOSIS — Z79899 Other long term (current) drug therapy: Secondary | ICD-10-CM | POA: Insufficient documentation

## 2013-06-14 DIAGNOSIS — Z8719 Personal history of other diseases of the digestive system: Secondary | ICD-10-CM | POA: Insufficient documentation

## 2013-06-14 DIAGNOSIS — S20219A Contusion of unspecified front wall of thorax, initial encounter: Secondary | ICD-10-CM | POA: Diagnosis not present

## 2013-06-14 DIAGNOSIS — R079 Chest pain, unspecified: Secondary | ICD-10-CM | POA: Diagnosis not present

## 2013-06-14 LAB — CBC WITH DIFFERENTIAL/PLATELET
Basophils Absolute: 0 10*3/uL (ref 0.0–0.1)
Basophils Relative: 0 % (ref 0–1)
EOS ABS: 0.1 10*3/uL (ref 0.0–0.7)
Eosinophils Relative: 1 % (ref 0–5)
HEMATOCRIT: 41.4 % (ref 36.0–46.0)
HEMOGLOBIN: 13.6 g/dL (ref 12.0–15.0)
LYMPHS ABS: 1.1 10*3/uL (ref 0.7–4.0)
Lymphocytes Relative: 16 % (ref 12–46)
MCH: 30.8 pg (ref 26.0–34.0)
MCHC: 32.9 g/dL (ref 30.0–36.0)
MCV: 93.9 fL (ref 78.0–100.0)
MONOS PCT: 10 % (ref 3–12)
Monocytes Absolute: 0.7 10*3/uL (ref 0.1–1.0)
Neutro Abs: 5.4 10*3/uL (ref 1.7–7.7)
Neutrophils Relative %: 74 % (ref 43–77)
Platelets: 149 10*3/uL — ABNORMAL LOW (ref 150–400)
RBC: 4.41 MIL/uL (ref 3.87–5.11)
RDW: 14.2 % (ref 11.5–15.5)
WBC: 7.4 10*3/uL (ref 4.0–10.5)

## 2013-06-14 LAB — COMPREHENSIVE METABOLIC PANEL
ALK PHOS: 80 U/L (ref 39–117)
ALT: 13 U/L (ref 0–35)
AST: 22 U/L (ref 0–37)
Albumin: 4 g/dL (ref 3.5–5.2)
BILIRUBIN TOTAL: 0.3 mg/dL (ref 0.3–1.2)
BUN: 23 mg/dL (ref 6–23)
CHLORIDE: 106 meq/L (ref 96–112)
CO2: 26 mEq/L (ref 19–32)
Calcium: 9.9 mg/dL (ref 8.4–10.5)
Creatinine, Ser: 1.21 mg/dL — ABNORMAL HIGH (ref 0.50–1.10)
GFR calc non Af Amer: 43 mL/min — ABNORMAL LOW (ref >90)
GFR, EST AFRICAN AMERICAN: 50 mL/min — AB (ref >90)
GLUCOSE: 98 mg/dL (ref 70–99)
Potassium: 4.2 mEq/L (ref 3.7–5.3)
Sodium: 144 mEq/L (ref 137–147)
TOTAL PROTEIN: 7.5 g/dL (ref 6.0–8.3)

## 2013-06-14 MED ORDER — ONDANSETRON HCL 4 MG/2ML IJ SOLN: 4.0000 mg | Freq: Once | INTRAMUSCULAR | Status: DC

## 2013-06-14 MED ORDER — HYDROMORPHONE HCL PF 1 MG/ML IJ SOLN: 1.0000 mg | Freq: Once | INTRAMUSCULAR | Status: DC

## 2013-06-14 MED ORDER — ACETAMINOPHEN 500 MG PO TABS
1000.0000 mg | ORAL_TABLET | Freq: Once | ORAL | Status: AC
  Administered 2013-06-14: 1000 mg via ORAL

## 2013-06-14 MED ORDER — TRAMADOL HCL 50 MG PO TABS: 50.0000 mg | ORAL_TABLET | Freq: Four times a day (QID) | ORAL | Status: DC | PRN

## 2013-06-14 MED ORDER — ACETAMINOPHEN 500 MG PO TABS
ORAL_TABLET | ORAL | Status: AC
  Filled 2013-06-14: qty 2

## 2013-06-14 MED ORDER — IOHEXOL 300 MG/ML  SOLN
64.0000 mL | Freq: Once | INTRAMUSCULAR | Status: AC | PRN
  Administered 2013-06-14: 64 mL via INTRAVENOUS

## 2013-06-14 NOTE — ED Notes (Signed)
Pt refused IV meds.
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
## 2013-06-14 NOTE — Discharge Instructions (Signed)
Follow up with your md in 2 days for recheck 

## 2013-06-17 DIAGNOSIS — G894 Chronic pain syndrome: Secondary | ICD-10-CM | POA: Diagnosis not present

## 2013-06-17 DIAGNOSIS — M159 Polyosteoarthritis, unspecified: Secondary | ICD-10-CM | POA: Diagnosis not present

## 2013-06-17 DIAGNOSIS — S2220XA Unspecified fracture of sternum, initial encounter for closed fracture: Secondary | ICD-10-CM | POA: Diagnosis not present

## 2013-08-10 DIAGNOSIS — M949 Disorder of cartilage, unspecified: Secondary | ICD-10-CM | POA: Diagnosis not present

## 2013-08-10 DIAGNOSIS — M899 Disorder of bone, unspecified: Secondary | ICD-10-CM | POA: Diagnosis not present

## 2013-08-10 DIAGNOSIS — Z1231 Encounter for screening mammogram for malignant neoplasm of breast: Secondary | ICD-10-CM | POA: Diagnosis not present

## 2013-08-16 ENCOUNTER — Other Ambulatory Visit: Payer: Self-pay | Admitting: Obstetrics & Gynecology

## 2013-08-16 DIAGNOSIS — M810 Age-related osteoporosis without current pathological fracture: Secondary | ICD-10-CM | POA: Diagnosis not present

## 2013-08-16 DIAGNOSIS — N951 Menopausal and female climacteric states: Secondary | ICD-10-CM | POA: Diagnosis not present

## 2013-08-16 DIAGNOSIS — Z124 Encounter for screening for malignant neoplasm of cervix: Secondary | ICD-10-CM | POA: Diagnosis not present

## 2013-08-17 LAB — CYTOLOGY - PAP

## 2013-09-01 DIAGNOSIS — N390 Urinary tract infection, site not specified: Secondary | ICD-10-CM | POA: Diagnosis not present

## 2013-09-01 DIAGNOSIS — R300 Dysuria: Secondary | ICD-10-CM | POA: Diagnosis not present

## 2013-09-16 IMAGING — CR DG CHEST 2V
2 series · 2 of 2 positions shown · non-contrast
Comparison: 03/15/2011, 08/17/2006

CLINICAL DATA: Chest asymmetry, enlargement of right hilum

CHEST - 2 VIEW

[view not recorded (1 of 2)]
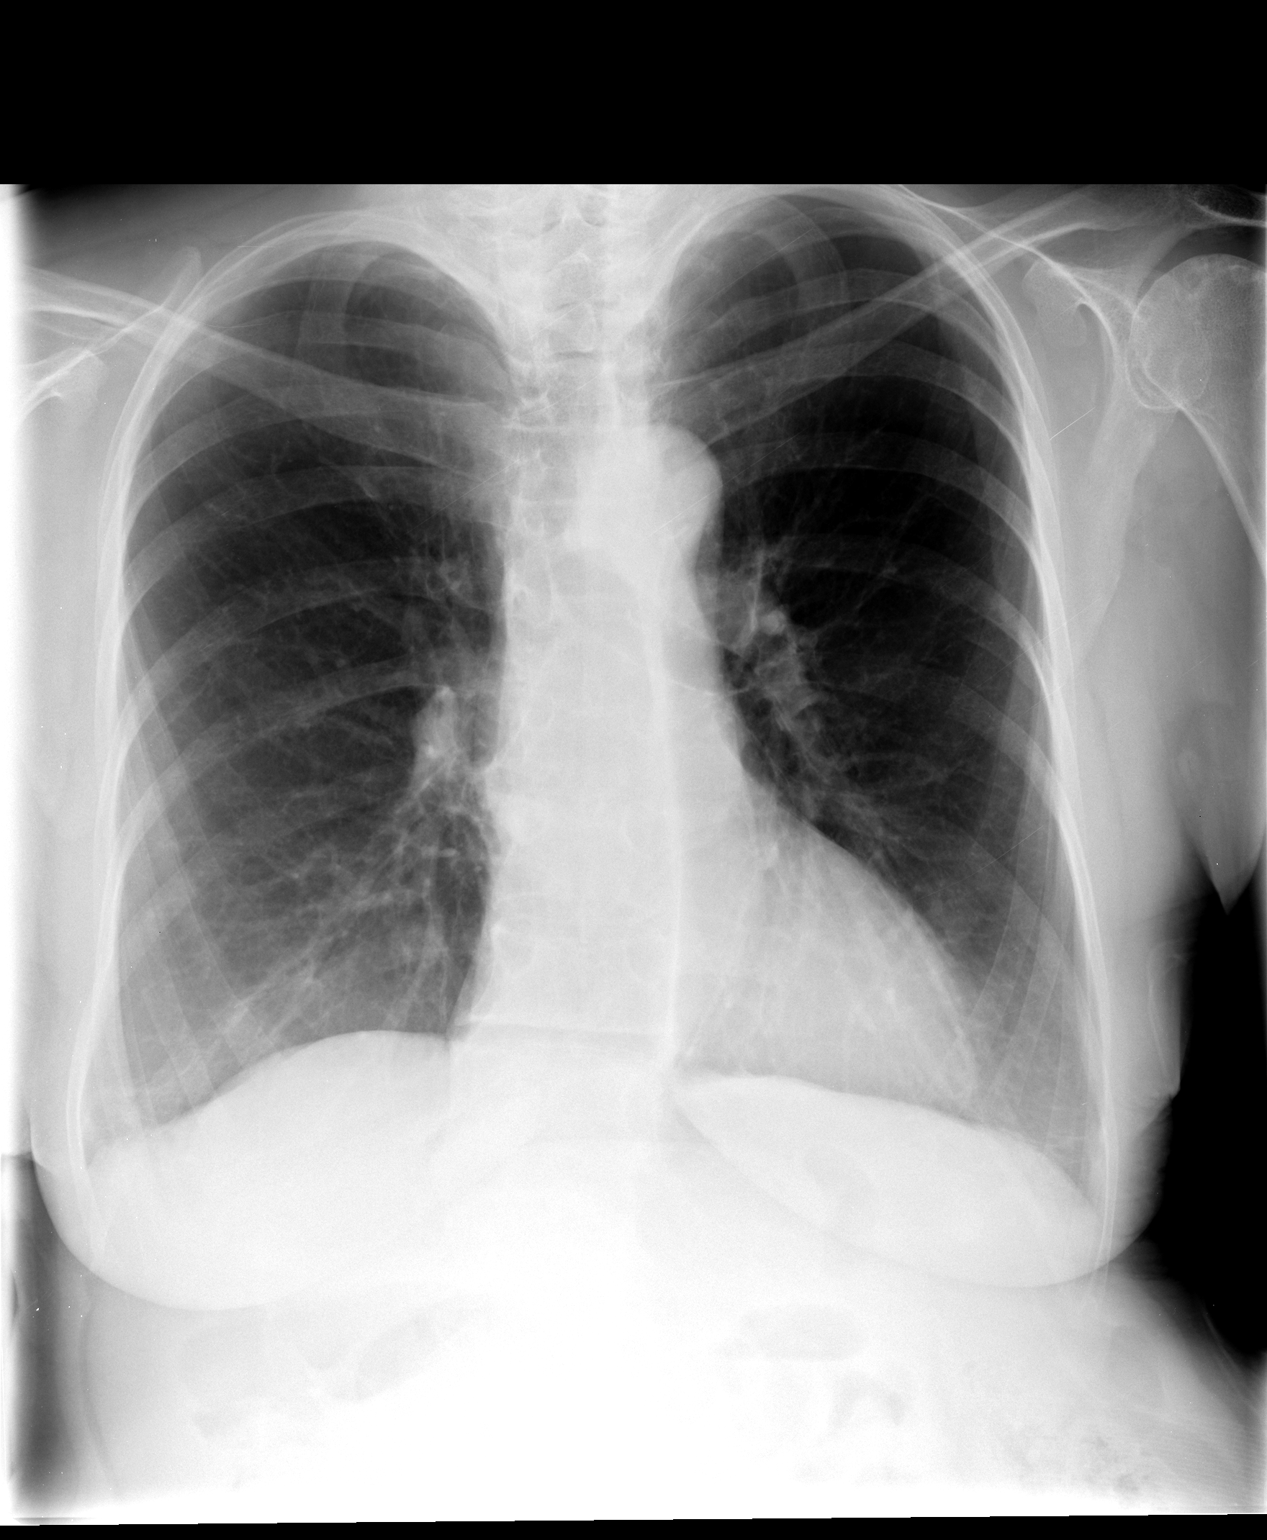

[view not recorded (2 of 2)]
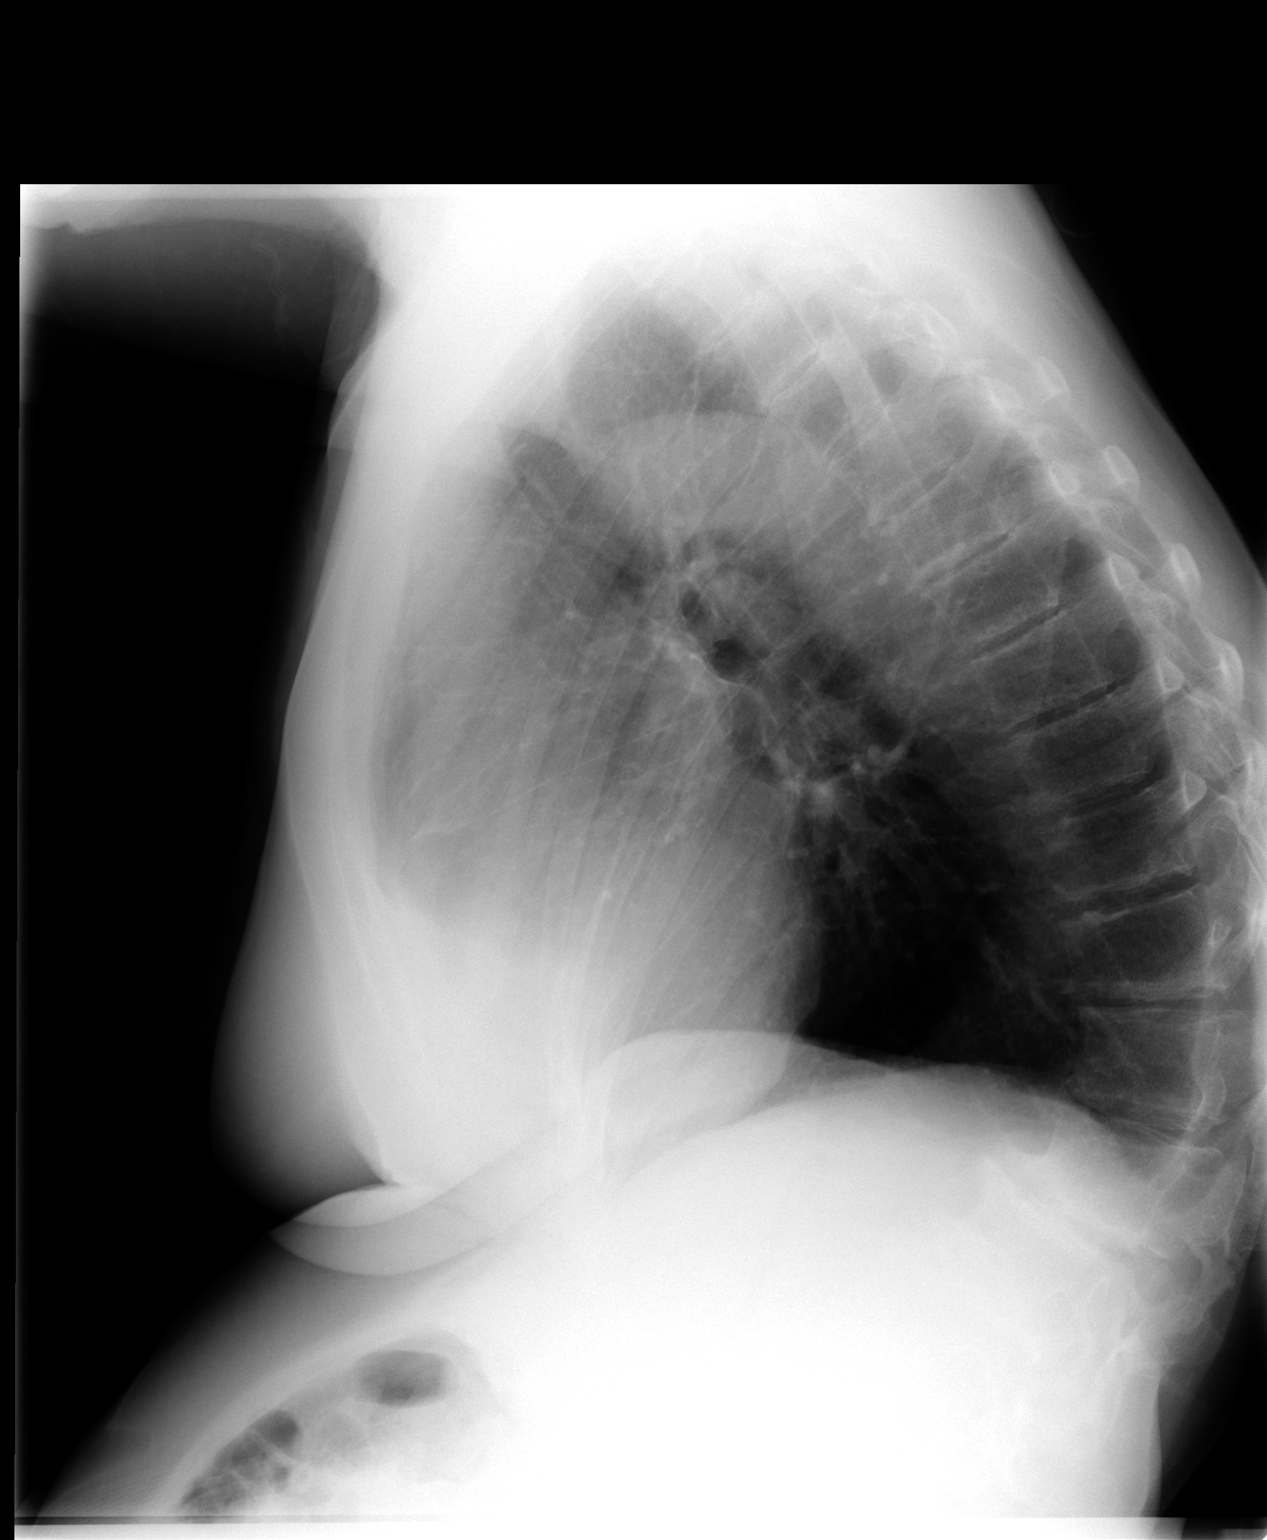

[2 of 2 positions shown; findings below may reference images not displayed]

FINDINGS: Upper normal heart size.
Tortuous aorta with mild atherosclerotic calcification.
Mediastinal contours and pulmonary vascularity normal.
Right hilar prominence is no longer identified.
No rotation on exam.
Lungs appear emphysematous with minimal peribronchial thickening.
Minimal atelectasis left base, improved.
No acute infiltrate, pleural effusion or pneumothorax.
Osseous demineralization with degenerative changes, focal kyphosis,
and chronic height loss of a vertebra at the thoracolumbar
junction.
IMPRESSION: Resolution of right hilar prominence seen on previous exam.
Emphysematous and minimal bronchitic changes with improved
bibasilar atelectasis.

## 2013-09-19 DIAGNOSIS — M4120 Other idiopathic scoliosis, site unspecified: Secondary | ICD-10-CM | POA: Diagnosis not present

## 2013-09-19 DIAGNOSIS — F39 Unspecified mood [affective] disorder: Secondary | ICD-10-CM | POA: Diagnosis not present

## 2013-10-19 DIAGNOSIS — R300 Dysuria: Secondary | ICD-10-CM | POA: Diagnosis not present

## 2013-12-05 ENCOUNTER — Ambulatory Visit (INDEPENDENT_AMBULATORY_CARE_PROVIDER_SITE_OTHER): Payer: Medicare Other | Admitting: Ophthalmology

## 2013-12-27 DIAGNOSIS — Z23 Encounter for immunization: Secondary | ICD-10-CM | POA: Diagnosis not present

## 2014-01-12 ENCOUNTER — Ambulatory Visit (INDEPENDENT_AMBULATORY_CARE_PROVIDER_SITE_OTHER): Payer: Medicare Other | Admitting: Otolaryngology

## 2014-01-12 DIAGNOSIS — H6123 Impacted cerumen, bilateral: Secondary | ICD-10-CM

## 2014-01-12 DIAGNOSIS — H903 Sensorineural hearing loss, bilateral: Secondary | ICD-10-CM

## 2014-03-21 DIAGNOSIS — F39 Unspecified mood [affective] disorder: Secondary | ICD-10-CM | POA: Diagnosis not present

## 2014-03-21 DIAGNOSIS — M41125 Adolescent idiopathic scoliosis, thoracolumbar region: Secondary | ICD-10-CM | POA: Diagnosis not present

## 2014-03-23 DIAGNOSIS — Z96652 Presence of left artificial knee joint: Secondary | ICD-10-CM | POA: Diagnosis not present

## 2014-03-23 DIAGNOSIS — Z471 Aftercare following joint replacement surgery: Secondary | ICD-10-CM | POA: Diagnosis not present

## 2014-05-04 DIAGNOSIS — M47812 Spondylosis without myelopathy or radiculopathy, cervical region: Secondary | ICD-10-CM | POA: Diagnosis not present

## 2014-05-04 DIAGNOSIS — M542 Cervicalgia: Secondary | ICD-10-CM | POA: Diagnosis not present

## 2014-05-04 DIAGNOSIS — M5032 Other cervical disc degeneration, mid-cervical region: Secondary | ICD-10-CM | POA: Diagnosis not present

## 2014-05-04 DIAGNOSIS — M419 Scoliosis, unspecified: Secondary | ICD-10-CM | POA: Diagnosis not present

## 2014-05-17 DIAGNOSIS — M545 Low back pain: Secondary | ICD-10-CM | POA: Diagnosis not present

## 2014-05-17 DIAGNOSIS — M47816 Spondylosis without myelopathy or radiculopathy, lumbar region: Secondary | ICD-10-CM | POA: Diagnosis not present

## 2014-05-17 DIAGNOSIS — M5136 Other intervertebral disc degeneration, lumbar region: Secondary | ICD-10-CM | POA: Diagnosis not present

## 2014-06-04 IMAGING — NM NM MYOCAR SINGLE W/SPECT W/WALL MOTION & EF
2 series · 12 of 12 positions shown · non-contrast
Comparison: none

Ordering Physician: REXANDER FUSTER

[place] Physician: [REDACTED]al Data: 72-year-old woman with history of essential tremor,
episode of prolonged chest pain, now referred for the assessment of
ischemia.
NUCLEAR MEDICINE STRESS MYOVIEW STUDY WITH SPECT AND LEFT
VENTRICULAR EJECTION FRACTION
Radionuclide Data: One-day rest/stress protocol performed with
[DATE] mCi of Uc-YYm Myoview.
Stress Data: Lexiscan bolus given per protocol.  Heart rate
increased from 63 beats per minute up to 111 beats per minute.
Blood pressure increased from 153/103 up to 185/99.  No chest pain
was reported.  No diagnostic ST-segment changes or arrhythmias were
noted.
EKG: Baseline tracing shows sinus rhythm at 65 beats per minute
with leftward axis, R prime in lead V1.
Scintigraphic Data: Analysis of the raw perfusion data finds breast
attenuation.
Tomographic views obtained using the short axis, vertical long
axis, and horizontal long axis planes.  There is a mild intensity,
small anteroapical defect that is fixed to most consistent with
soft tissue attenuation, more prominent at rest than at stress.  No
clear evidence of ischemia.
Gated imaging reveals an EDV of 65, ESV of 19, T I D ratio of 1.21,
and LVEF of 71%.

[cr cardiac tc low dose · 6.41mm/px · 6 of 64 frames shown]
[frame 6/64]
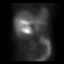
[frame 16/64]
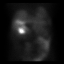
[frame 27/64]
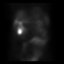
[frame 38/64]
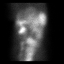
[frame 48/64]
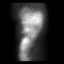
[frame 59/64]
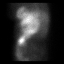

[cs cardiac tc hi dose · 6.41mm/px · 6 of 512 frames shown]
[frame 43/512]
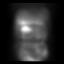
[frame 128/512]
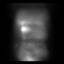
[frame 214/512]
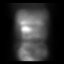
[frame 299/512]
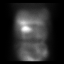
[frame 384/512]
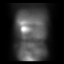
[frame 470/512]
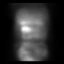

[12 of 12 positions shown; findings below may reference images not displayed]

IMPRESSION: Low risk Lexiscan Myoview.  No diagnostic ST-segment changes or
arrhythmias were noted.  No chest pain reported.  The patient was
hypertensive throughout.  Perfusion imaging is consistent with
breast attenuation, no definite scar or ischemia.  LVEF 71%.

## 2014-06-20 DIAGNOSIS — N390 Urinary tract infection, site not specified: Secondary | ICD-10-CM | POA: Diagnosis not present

## 2014-06-20 DIAGNOSIS — R300 Dysuria: Secondary | ICD-10-CM | POA: Diagnosis not present

## 2014-06-21 DIAGNOSIS — R918 Other nonspecific abnormal finding of lung field: Secondary | ICD-10-CM | POA: Diagnosis not present

## 2014-06-21 DIAGNOSIS — R911 Solitary pulmonary nodule: Secondary | ICD-10-CM | POA: Diagnosis not present

## 2014-08-15 DIAGNOSIS — Z1231 Encounter for screening mammogram for malignant neoplasm of breast: Secondary | ICD-10-CM | POA: Diagnosis not present

## 2014-08-21 DIAGNOSIS — M8580 Other specified disorders of bone density and structure, unspecified site: Secondary | ICD-10-CM | POA: Diagnosis not present

## 2014-08-21 DIAGNOSIS — N951 Menopausal and female climacteric states: Secondary | ICD-10-CM | POA: Diagnosis not present

## 2014-08-22 DIAGNOSIS — M47816 Spondylosis without myelopathy or radiculopathy, lumbar region: Secondary | ICD-10-CM | POA: Diagnosis not present

## 2014-08-22 DIAGNOSIS — M5136 Other intervertebral disc degeneration, lumbar region: Secondary | ICD-10-CM | POA: Diagnosis not present

## 2014-08-22 DIAGNOSIS — M545 Low back pain: Secondary | ICD-10-CM | POA: Diagnosis not present

## 2014-10-14 DIAGNOSIS — R05 Cough: Secondary | ICD-10-CM | POA: Diagnosis not present

## 2014-10-14 DIAGNOSIS — J309 Allergic rhinitis, unspecified: Secondary | ICD-10-CM | POA: Diagnosis not present

## 2014-10-17 DIAGNOSIS — G894 Chronic pain syndrome: Secondary | ICD-10-CM | POA: Diagnosis not present

## 2014-10-17 DIAGNOSIS — E785 Hyperlipidemia, unspecified: Secondary | ICD-10-CM | POA: Diagnosis not present

## 2014-10-17 DIAGNOSIS — E559 Vitamin D deficiency, unspecified: Secondary | ICD-10-CM | POA: Diagnosis not present

## 2014-10-17 DIAGNOSIS — M1990 Unspecified osteoarthritis, unspecified site: Secondary | ICD-10-CM | POA: Diagnosis not present

## 2014-11-28 DIAGNOSIS — M5136 Other intervertebral disc degeneration, lumbar region: Secondary | ICD-10-CM | POA: Diagnosis not present

## 2014-11-28 DIAGNOSIS — M47816 Spondylosis without myelopathy or radiculopathy, lumbar region: Secondary | ICD-10-CM | POA: Diagnosis not present

## 2014-11-28 DIAGNOSIS — M545 Low back pain: Secondary | ICD-10-CM | POA: Diagnosis not present

## 2014-12-23 DIAGNOSIS — Z23 Encounter for immunization: Secondary | ICD-10-CM | POA: Diagnosis not present

## 2015-02-12 DIAGNOSIS — R300 Dysuria: Secondary | ICD-10-CM | POA: Diagnosis not present

## 2015-03-14 DIAGNOSIS — M5136 Other intervertebral disc degeneration, lumbar region: Secondary | ICD-10-CM | POA: Diagnosis not present

## 2015-03-14 DIAGNOSIS — M545 Low back pain: Secondary | ICD-10-CM | POA: Diagnosis not present

## 2015-03-14 DIAGNOSIS — M47816 Spondylosis without myelopathy or radiculopathy, lumbar region: Secondary | ICD-10-CM | POA: Diagnosis not present

## 2015-03-19 DIAGNOSIS — X32XXXA Exposure to sunlight, initial encounter: Secondary | ICD-10-CM | POA: Diagnosis not present

## 2015-03-19 DIAGNOSIS — D225 Melanocytic nevi of trunk: Secondary | ICD-10-CM | POA: Diagnosis not present

## 2015-03-19 DIAGNOSIS — L570 Actinic keratosis: Secondary | ICD-10-CM | POA: Diagnosis not present

## 2015-04-13 ENCOUNTER — Encounter: Payer: Self-pay | Admitting: Cardiovascular Disease

## 2015-04-13 DIAGNOSIS — E559 Vitamin D deficiency, unspecified: Secondary | ICD-10-CM | POA: Diagnosis not present

## 2015-04-13 DIAGNOSIS — R079 Chest pain, unspecified: Secondary | ICD-10-CM | POA: Diagnosis not present

## 2015-04-13 DIAGNOSIS — E871 Hypo-osmolality and hyponatremia: Secondary | ICD-10-CM | POA: Diagnosis not present

## 2015-04-13 DIAGNOSIS — M1990 Unspecified osteoarthritis, unspecified site: Secondary | ICD-10-CM | POA: Diagnosis not present

## 2015-04-13 DIAGNOSIS — E7800 Pure hypercholesterolemia, unspecified: Secondary | ICD-10-CM | POA: Diagnosis not present

## 2015-04-13 DIAGNOSIS — G894 Chronic pain syndrome: Secondary | ICD-10-CM | POA: Diagnosis not present

## 2015-04-13 DIAGNOSIS — R7309 Other abnormal glucose: Secondary | ICD-10-CM | POA: Diagnosis not present

## 2015-04-20 DIAGNOSIS — Z471 Aftercare following joint replacement surgery: Secondary | ICD-10-CM | POA: Diagnosis not present

## 2015-04-20 DIAGNOSIS — M1711 Unilateral primary osteoarthritis, right knee: Secondary | ICD-10-CM | POA: Diagnosis not present

## 2015-04-20 DIAGNOSIS — Z96652 Presence of left artificial knee joint: Secondary | ICD-10-CM | POA: Diagnosis not present

## 2015-05-14 ENCOUNTER — Emergency Department (HOSPITAL_COMMUNITY)
Admission: EM | Admit: 2015-05-14 | Discharge: 2015-05-14 | Disposition: A | Discharge status: Home / Self Care | Payer: Medicare Other | Attending: Emergency Medicine | Admitting: Emergency Medicine

## 2015-05-14 ENCOUNTER — Emergency Department (HOSPITAL_COMMUNITY): Payer: Medicare Other

## 2015-05-14 ENCOUNTER — Encounter (HOSPITAL_COMMUNITY): Payer: Self-pay | Admitting: *Deleted

## 2015-05-14 DIAGNOSIS — Z87891 Personal history of nicotine dependence: Secondary | ICD-10-CM | POA: Insufficient documentation

## 2015-05-14 DIAGNOSIS — F329 Major depressive disorder, single episode, unspecified: Secondary | ICD-10-CM | POA: Diagnosis not present

## 2015-05-14 DIAGNOSIS — Z85038 Personal history of other malignant neoplasm of large intestine: Secondary | ICD-10-CM | POA: Insufficient documentation

## 2015-05-14 DIAGNOSIS — Z5321 Procedure and treatment not carried out due to patient leaving prior to being seen by health care provider: Secondary | ICD-10-CM | POA: Insufficient documentation

## 2015-05-14 DIAGNOSIS — R079 Chest pain, unspecified: Secondary | ICD-10-CM

## 2015-05-14 LAB — BASIC METABOLIC PANEL
ANION GAP: 7 (ref 5–15)
BUN: 23 mg/dL — ABNORMAL HIGH (ref 6–20)
CALCIUM: 9.8 mg/dL (ref 8.9–10.3)
CO2: 29 mmol/L (ref 22–32)
Chloride: 107 mmol/L (ref 101–111)
Creatinine, Ser: 1.27 mg/dL — ABNORMAL HIGH (ref 0.44–1.00)
GFR calc Af Amer: 47 mL/min — ABNORMAL LOW (ref >60)
GFR, EST NON AFRICAN AMERICAN: 40 mL/min — AB (ref >60)
GLUCOSE: 93 mg/dL (ref 65–99)
Potassium: 4.5 mmol/L (ref 3.5–5.1)
SODIUM: 143 mmol/L (ref 135–145)

## 2015-05-14 LAB — URINALYSIS, ROUTINE W REFLEX MICROSCOPIC
Bilirubin Urine: NEGATIVE
Glucose, UA: NEGATIVE mg/dL
Hgb urine dipstick: NEGATIVE
Ketones, ur: NEGATIVE mg/dL
Leukocytes, UA: NEGATIVE
Nitrite: NEGATIVE
Protein, ur: NEGATIVE mg/dL
Specific Gravity, Urine: 1.01 (ref 1.005–1.030)
pH: 6 (ref 5.0–8.0)

## 2015-05-14 LAB — CBC
HCT: 44.4 % (ref 36.0–46.0)
HEMOGLOBIN: 14 g/dL (ref 12.0–15.0)
MCH: 30.6 pg (ref 26.0–34.0)
MCHC: 31.5 g/dL (ref 30.0–36.0)
MCV: 96.9 fL (ref 78.0–100.0)
Platelets: 192 10*3/uL (ref 150–400)
RBC: 4.58 MIL/uL (ref 3.87–5.11)
RDW: 14 % (ref 11.5–15.5)
WBC: 5.9 10*3/uL (ref 4.0–10.5)

## 2015-05-14 LAB — TROPONIN I

## 2015-05-14 NOTE — Discharge Instructions (Signed)
Nonspecific Chest Pain  °Chest pain can be caused by many different conditions. There is always a chance that your pain could be related to something serious, such as a heart attack or a blood clot in your lungs. Chest pain can also be caused by conditions that are not life-threatening. If you have chest pain, it is very important to follow up with your health care provider. °CAUSES  °Chest pain can be caused by: °· Heartburn. °· Pneumonia or bronchitis. °· Anxiety or stress. °· Inflammation around your heart (pericarditis) or lung (pleuritis or pleurisy). °· A blood clot in your lung. °· A collapsed lung (pneumothorax). It can develop suddenly on its own (spontaneous pneumothorax) or from trauma to the chest. °· Shingles infection (varicella-zoster virus). °· Heart attack. °· Damage to the bones, muscles, and cartilage that make up your chest wall. This can include: °¨ Bruised bones due to injury. °¨ Strained muscles or cartilage due to frequent or repeated coughing or overwork. °¨ Fracture to one or more ribs. °¨ Sore cartilage due to inflammation (costochondritis). °RISK FACTORS  °Risk factors for chest pain may include: °· Activities that increase your risk for trauma or injury to your chest. °· Respiratory infections or conditions that cause frequent coughing. °· Medical conditions or overeating that can cause heartburn. °· Heart disease or family history of heart disease. °· Conditions or health behaviors that increase your risk of developing a blood clot. °· Having had chicken pox (varicella zoster). °SIGNS AND SYMPTOMS °Chest pain can feel like: °· Burning or tingling on the surface of your chest or deep in your chest. °· Crushing, pressure, aching, or squeezing pain. °· Dull or sharp pain that is worse when you move, cough, or take a deep breath. °· Pain that is also felt in your back, neck, shoulder, or arm, or pain that spreads to any of these areas. °Your chest pain may come and go, or it may stay  constant. °DIAGNOSIS °Lab tests or other studies may be needed to find the cause of your pain. Your health care provider may have you take a test called an ambulatory ECG (electrocardiogram). An ECG records your heartbeat patterns at the time the test is performed. You may also have other tests, such as: °· Transthoracic echocardiogram (TTE). During echocardiography, sound waves are used to create a picture of all of the heart structures and to look at how blood flows through your heart. °· Transesophageal echocardiogram (TEE). This is a more advanced imaging test that obtains images from inside your body. It allows your health care provider to see your heart in finer detail. °· Cardiac monitoring. This allows your health care provider to monitor your heart rate and rhythm in real time. °· Holter monitor. This is a portable device that records your heartbeat and can help to diagnose abnormal heartbeats. It allows your health care provider to track your heart activity for several days, if needed. °· Stress tests. These can be done through exercise or by taking medicine that makes your heart beat more quickly. °· Blood tests. °· Imaging tests. °TREATMENT  °Your treatment depends on what is causing your chest pain. Treatment may include: °· Medicines. These may include: °¨ Acid blockers for heartburn. °¨ Anti-inflammatory medicine. °¨ Pain medicine for inflammatory conditions. °¨ Antibiotic medicine, if an infection is present. °¨ Medicines to dissolve blood clots. °¨ Medicines to treat coronary artery disease. °· Supportive care for conditions that do not require medicines. This may include: °¨ Resting. °¨ Applying heat   or cold packs to injured areas. °¨ Limiting activities until pain decreases. °HOME CARE INSTRUCTIONS °· If you were prescribed an antibiotic medicine, finish it all even if you start to feel better. °· Avoid any activities that bring on chest pain. °· Do not use any tobacco products, including  cigarettes, chewing tobacco, or electronic cigarettes. If you need help quitting, ask your health care provider. °· Do not drink alcohol. °· Take medicines only as directed by your health care provider. °· Keep all follow-up visits as directed by your health care provider. This is important. This includes any further testing if your chest pain does not go away. °· If heartburn is the cause for your chest pain, you may be told to keep your head raised (elevated) while sleeping. This reduces the chance that acid will go from your stomach into your esophagus. °· Make lifestyle changes as directed by your health care provider. These may include: °¨ Getting regular exercise. Ask your health care provider to suggest some activities that are safe for you. °¨ Eating a heart-healthy diet. A registered dietitian can help you to learn healthy eating options. °¨ Maintaining a healthy weight. °¨ Managing diabetes, if necessary. °¨ Reducing stress. °SEEK MEDICAL CARE IF: °· Your chest pain does not go away after treatment. °· You have a rash with blisters on your chest. °· You have a fever. °SEEK IMMEDIATE MEDICAL CARE IF:  °· Your chest pain is worse. °· You have an increasing cough, or you cough up blood. °· You have severe abdominal pain. °· You have severe weakness. °· You faint. °· You have chills. °· You have sudden, unexplained chest discomfort. °· You have sudden, unexplained discomfort in your arms, back, neck, or jaw. °· You have shortness of breath at any time. °· You suddenly start to sweat, or your skin gets clammy. °· You feel nauseous or you vomit. °· You suddenly feel light-headed or dizzy. °· Your heart begins to beat quickly, or it feels like it is skipping beats. °These symptoms may represent a serious problem that is an emergency. Do not wait to see if the symptoms will go away. Get medical help right away. Call your local emergency services (911 in the [place] Do not drive yourself to the hospital. °  °This  information is not intended to replace advice given to you by your health care provider. Make sure you discuss any questions you have with your health care provider. °  °Document Released: 12/04/2004 Document Revised: 03/17/2014 Document Reviewed: 09/30/2013 °Elsevier Interactive Patient Education ©2016 [employer] ° °

## 2015-05-14 NOTE — ED Notes (Signed)
3rd conversation with patient about realistic expectations in the ED setting. Pt starting demanding she get discharged after being in ED for 55 mins stating we are interfering with her and her husband eating lunch. Despite instructing patient that lab work is pending and average time in ED is 3-4 hours for her type of complaint, she continued to also complain at 1h 30 mins and at 2 hrs. Most recently she said last time she was here in the ED she filed a complaint over such a long ED visit and that she will file another one if she needs to.   2nd offer to provide snacks / juice to patient and husband. Spouse declined but she accepted orange juice and crackers. Re-assurance again provided.

## 2015-05-14 NOTE — ED Notes (Signed)
Pt declined being placed on cardiac monitor or IV,

## 2015-05-14 NOTE — ED Notes (Signed)
Pt comes in for and episode of chest pain that she had around 1100 this morning. Pt states her pain lasted 3 minutes and went away. Pt has no pain upon triage.

## 2015-06-04 ENCOUNTER — Telehealth: Payer: Self-pay | Admitting: Cardiology

## 2015-06-26 ENCOUNTER — Encounter: Payer: Self-pay | Admitting: Internal Medicine

## 2015-06-26 ENCOUNTER — Ambulatory Visit (INDEPENDENT_AMBULATORY_CARE_PROVIDER_SITE_OTHER): Payer: Medicare Other | Admitting: Cardiovascular Disease

## 2015-06-26 ENCOUNTER — Encounter: Payer: Self-pay | Admitting: Cardiovascular Disease

## 2015-06-26 VITALS — BP 144/110 | HR 68 | Ht 64.0 in | Wt 140.0 lb

## 2015-06-26 DIAGNOSIS — R079 Chest pain, unspecified: Secondary | ICD-10-CM

## 2015-06-26 NOTE — Patient Instructions (Signed)
Medication Instructions:  Your physician recommends that you continue on your current medications as directed. Please refer to the Current Medication list given to you today.   Labwork: None Ordered   Testing/Procedures: Your physician has requested that you have a lexiscan myoview. For further information please visit [email or web address]. Please follow instruction sheet, as given.   Follow-Up: Your physician recommends that you schedule a follow-up appointment in: as needed with Dr. Nahser.    If you need a refill on your cardiac medications before your next appointment, please call your pharmacy.   Thank you for choosing [facility]! Dontaye Hur, RN [phone]   

## 2015-06-26 NOTE — Progress Notes (Signed)
Cardiology Office Note   Date:  06/26/2015   ID:  WESLEE ELLERY, DOB Dec 19, 1940, MRN [id number]  PCP:  Denny Levy, [place]  Cardiologist:   Acie Fredrickson Wonda Cheng, MD , previously [facility]   Chief Complaint  Patient presents with  . Chest Pain   Problem list 1.Chest pain . 2. Bipolar disease 3.    History of Present Illness: June 26, 2015 [name] is a 75 y.o. female who presents for chest pain  She is seen today for evaluation of some chest pain She informed me an episode in 2014. Lasted 3 minutes.  Had another episode in 2017 While resting on the sofa.   No radiation,  Mid sternal, no sweats. Has had 3 episodes of CP this year. Went  to [facility] after 1 episode,   Work up  In the ER was normal   Swims regularly - does not have any CP while swimming . Has adult onset scoliosis .   So she has back pain while swimming   Past Medical History  Diagnosis Date  . Hypotension   . Depression   . Tubular adenoma of colon 07/29/2007    Dr. Faith Rogue colonoscopy, previous focally adenomatous cecal polyp 2006  . Diverticulosis of colon 07/29/2007  . Bipolar affective disorder [facility])     Past Surgical History  Procedure Laterality Date  . Knee arthroscopy    . Total knee arthroplasty    . Spinal tumor removal       Current Outpatient Prescriptions  Medication Sig Dispense Refill  . alendronate (FOSAMAX) 70 MG tablet Take 70 mg by mouth every Wednesday. Take with a full glass of water on an empty stomach.    . Cholecalciferol (VITAMIN D-3 PO) Take 1 capsule by mouth daily.    . Estradiol (VAGIFEM) 10 MCG TABS vaginal tablet Place 1 each vaginally once a week.     . propranolol (INDERAL) 20 MG tablet Take 20 mg by mouth daily.    . traMADol (ULTRAM) 50 MG tablet Take 1 tablet (50 mg total) by mouth every 6 (six) hours as needed. 20 tablet 0   No current facility-administered medications for this visit.    Allergies:   Amoxicillin; Clindamycin/lincomycin;  Morphine and related; Other; and Oxycodone    Social History:  The patient  reports that she has quit smoking. Her smoking use included Cigarettes. She does not have any smokeless tobacco history on file. She reports that she does not drink alcohol or use illicit drugs.   Family History:  The patient's family history includes Kidney disease in her mother.    ROS:  Please see the history of present illness.    Review of Systems: Constitutional:  denies fever, chills, diaphoresis, appetite change and fatigue.  HEENT: denies photophobia, eye pain, redness, hearing loss, ear pain, congestion, sore throat, rhinorrhea, sneezing, neck pain, neck stiffness and tinnitus.  Respiratory: denies SOB, DOE, cough, chest tightness, and wheezing.  Cardiovascular: admits to chest pain.  She denies palpitations and leg swelling.  Gastrointestinal: denies nausea, vomiting, abdominal pain, diarrhea, constipation, blood in stool.  Genitourinary: denies dysuria, urgency, frequency, hematuria, flank pain and difficulty urinating.  Musculoskeletal: denies  myalgias, back pain, joint swelling, arthralgias and gait problem.   Skin: denies pallor, rash and wound.  Neurological: denies dizziness, seizures, syncope, weakness, light-headedness, numbness and headaches.   Hematological: denies adenopathy, easy bruising, personal or family bleeding history.  Psychiatric/ Behavioral: denies suicidal ideation, mood changes, confusion, nervousness, sleep  disturbance and agitation.       All other systems are reviewed and negative.    PHYSICAL EXAM: VS:  BP 144/110 mmHg  Pulse 68  Ht 5\' 4"  (1.626 m)  Wt 140 lb (63.504 kg)  BMI 24.02 kg/m2 , BMI Body mass index is 24.02 kg/(m^2). GEN: Well nourished, well developed, in no acute distress HEENT: normal Neck: no JVD, carotid bruits, or masses Cardiac: RRR; no murmurs, rubs, or gallops,no edema  Respiratory:  clear to auscultation bilaterally, normal work of  breathing GI: soft, nontender, nondistended, + BS MS: no deformity or atrophy Skin: warm and dry, no rash Neuro:  Strength and sensation are intact Psych: normal   EKG:  EKG is ordered today. The ekg ordered today demonstrates  NSR at 68 . LAD .    [facility] from [facility] were reviewed    Recent Labs: 05/14/2015: BUN 23*; Creatinine, Ser 1.27*; Hemoglobin 14.0; Platelets 192; Potassium 4.5; Sodium 143    Lipid Panel No results found for: CHOL, TRIG, HDL, CHOLHDL, VLDL, LDLCALC, LDLDIRECT    Wt Readings from Last 3 Encounters:  06/26/15 140 lb (63.504 kg)  05/14/15 138 lb (62.596 kg)  04/01/12 131 lb (59.421 kg)      Other studies Reviewed: Additional studies/ records that were reviewed today include: . Review of the above records demonstrates:    ASSESSMENT AND PLAN:  1.  Chest pain :  Fairly atypical . Has hx of HTN.   Will get a Lexiscan myoview for further eval.  2.  HTN:   BP is a little elevated. Follow up with her primary MD     Current medicines are reviewed at length with the patient today.  The patient does not have concerns regarding medicines.  The following changes have been made:  no change  Labs/ tests ordered today include:  No orders of the defined types were placed in this encounter.     Disposition:   FU with me as needed.       Nahser, Wonda Cheng, MD  06/26/2015 3:10 PM    [facility] [place], [place], [place]  [address] Phone: [phone]; Fax: [phone]   [facility]  [address] [place], [place]  [address] [phone]   Fax [phone]

## 2015-06-27 ENCOUNTER — Telehealth (HOSPITAL_COMMUNITY): Payer: Self-pay | Admitting: *Deleted

## 2015-06-27 NOTE — Telephone Encounter (Signed)
Patient given detailed instructions per Myocardial Perfusion Study Information Sheet for the test on 07/03/15. Patient notified to arrive 15 minutes early and that it is imperative to arrive on time for appointment to keep from having the test rescheduled.  If you need to cancel or reschedule your appointment, please call the office within 24 hours of your appointment. Failure to do so may result in a cancellation of your appointment, and a $50 no show fee. Patient verbalized understanding.Hubbard Robinson, RN

## 2015-07-03 ENCOUNTER — Ambulatory Visit (HOSPITAL_COMMUNITY): Payer: Medicare Other | Attending: Cardiology

## 2015-07-03 DIAGNOSIS — I10 Essential (primary) hypertension: Secondary | ICD-10-CM | POA: Insufficient documentation

## 2015-07-03 DIAGNOSIS — R9439 Abnormal result of other cardiovascular function study: Secondary | ICD-10-CM | POA: Insufficient documentation

## 2015-07-03 DIAGNOSIS — R079 Chest pain, unspecified: Secondary | ICD-10-CM | POA: Diagnosis not present

## 2015-07-03 LAB — MYOCARDIAL PERFUSION IMAGING
CHL CUP NUCLEAR SDS: 5
CHL CUP NUCLEAR SSS: 17
CSEPPHR: 99 {beats}/min
LV dias vol: 58 mL (ref 46–106)
LVSYSVOL: 14 mL
RATE: 0.22
Rest HR: 69 {beats}/min
SRS: 12
TID: 1

## 2015-07-03 MED ORDER — TECHNETIUM TC 99M SESTAMIBI GENERIC - CARDIOLITE
10.2000 | Freq: Once | INTRAVENOUS | Status: AC | PRN
  Administered 2015-07-03: 10 via INTRAVENOUS

## 2015-07-03 MED ORDER — TECHNETIUM TC 99M SESTAMIBI GENERIC - CARDIOLITE
32.7000 | Freq: Once | INTRAVENOUS | Status: AC | PRN
  Administered 2015-07-03: 32.7 via INTRAVENOUS

## 2015-07-03 MED ORDER — REGADENOSON 0.4 MG/5ML IV SOLN
0.4000 mg | Freq: Once | INTRAVENOUS | Status: AC
  Administered 2015-07-03: 0.4 mg via INTRAVENOUS

## 2015-07-04 ENCOUNTER — Telehealth: Payer: Self-pay | Admitting: Cardiovascular Disease

## 2015-07-04 NOTE — Telephone Encounter (Signed)
NEW MESSAGE   Pt verbalized that she received a call for the rn and she is returning call about results of stress test

## 2015-07-04 NOTE — Telephone Encounter (Signed)
Left pt a message to call back. 

## 2015-07-04 NOTE — Telephone Encounter (Signed)
Pt is aware of stress test results.

## 2015-07-16 ENCOUNTER — Telehealth: Payer: Self-pay | Admitting: Cardiovascular Disease

## 2015-07-16 NOTE — Telephone Encounter (Signed)
New message     Pt requested a copy of their stress, send to [address], [place], [place] [address](Myocardal Perfusion Imaging

## 2015-07-25 ENCOUNTER — Other Ambulatory Visit: Payer: Self-pay

## 2015-07-25 ENCOUNTER — Encounter: Payer: Self-pay | Admitting: Gastroenterology

## 2015-07-25 ENCOUNTER — Ambulatory Visit (INDEPENDENT_AMBULATORY_CARE_PROVIDER_SITE_OTHER): Payer: Medicare Other | Admitting: Gastroenterology

## 2015-07-25 VITALS — BP 137/89 | HR 74 | Temp 97.2°F | Ht 64.0 in | Wt 142.2 lb

## 2015-07-25 DIAGNOSIS — Z860100 Personal history of colon polyps, unspecified: Secondary | ICD-10-CM

## 2015-07-25 DIAGNOSIS — D126 Benign neoplasm of colon, unspecified: Secondary | ICD-10-CM | POA: Diagnosis not present

## 2015-07-25 DIAGNOSIS — K5900 Constipation, unspecified: Secondary | ICD-10-CM | POA: Diagnosis not present

## 2015-07-25 MED ORDER — PEG 3350-KCL-NA BICARB-NACL 420 G PO SOLR: 4000.0000 mL | Freq: Once | ORAL | Status: DC

## 2015-07-25 NOTE — Assessment & Plan Note (Signed)
75 year old female with history of adenomatous colon polyps on several of her colonoscopies who presents for surveillance colonoscopy. Aside from mild constipation managed with dietary measures, she is doing well from a GI standpoint.    I have discussed the risks, alternatives, benefits with regards to but not limited to the risk of reaction to medication, bleeding, infection, perforation and the patient is agreeable to proceed. Written consent to be obtained.  She will use linzess 134mcg daily for five days prior to her colonoscopy bowel prep. Samples provided.

## 2015-07-25 NOTE — Progress Notes (Signed)
Primary Care Physician:  Denny Levy, [place]  Primary Gastroenterologist:  Garfield Cornea, MD   Chief Complaint  Patient presents with  . Colonoscopy    HPI:  [name] is a 75 y.o. female here female with History of adenomatous colon polyps who presents to schedule surveillance colonoscopy. Last colonoscopy in 2012. She had left-sided diverticula, pedunculated polyp removed from the splenic flexure which was adenomatous.  Overall she has been doing well from a GI standpoint. She has had increased issues with constipation now that she is on tramadol regularly for her scoliosis and kyphosis. She has been using prunes with good relief. Denies blood in the stool or melena. Denies abdominal pain. No heartburn or indigestion. No unintentional weight loss. Recently had some chest pain and was evaluated by cardiology with negative stress test. Denies chest pain related to meals. No dysphagia.  Current Outpatient Prescriptions  Medication Sig Dispense Refill  . alendronate (FOSAMAX) 70 MG tablet Take 70 mg by mouth every Wednesday. Take with a full glass of water on an empty stomach.    . Cholecalciferol (VITAMIN D-3 PO) Take 1 capsule by mouth daily.    . Estradiol (VAGIFEM) 10 MCG TABS vaginal tablet Place 1 each vaginally once a week. Reported on 07/25/2015    . propranolol (INDERAL) 20 MG tablet Take 20 mg by mouth daily.    . traMADol (ULTRAM) 50 MG tablet Take 1 tablet (50 mg total) by mouth every 6 (six) hours as needed. 20 tablet 0   No current facility-administered medications for this visit.    Allergies as of 07/25/2015 - Review Complete 07/25/2015  Allergen Reaction Noted  . Amoxicillin Nausea And Vomiting 03/31/2012  . Clindamycin/lincomycin Nausea And Vomiting 03/31/2012  . Morphine and related Nausea And Vomiting 07/03/2010  . Other  03/15/2011  . Oxycodone Nausea Only 06/26/2015    Past Medical History  Diagnosis Date  . Hypotension   . Depression   . Tubular adenoma  of colon 07/29/2007    Dr. Faith Rogue colonoscopy, previous focally adenomatous cecal polyp 2006  . Diverticulosis of colon 07/29/2007  . Bipolar affective disorder [facility])     Past Surgical History  Procedure Laterality Date  . Knee arthroscopy    . Total knee arthroplasty    . Spinal tumor removal    . Colonoscopy  2012    Dr. Gala Romney: Tubular adenoma, diverticulosis.    Family History  Problem Relation Age of Onset  . Kidney disease Mother   . Colitis      ? type in sibling  . Colon cancer Neg Hx     Social History   Social History  . Marital Status: Married    Spouse Name: N/A  . Number of Children: N/A  . Years of Education: N/A   Occupational History  . retired    Social History Main Topics  . Smoking status: Former Smoker    Types: Cigarettes  . Smokeless tobacco: Not on file  . Alcohol Use: No  . Drug Use: No  . Sexual Activity: Not on file   Other Topics Concern  . Not on file   Social History Narrative      ROS:  General: Negative for anorexia, weight loss, fever, chills, fatigue, weakness. Eyes: Negative for vision changes.  ENT: Negative for hoarseness, difficulty swallowing , nasal congestion. CV: Negative for chest pain, angina, palpitations, dyspnea on exertion, peripheral edema.  Respiratory: Negative for dyspnea at rest, dyspnea on exertion, cough, sputum, wheezing.  GI:  See history of present illness. GU:  Negative for dysuria, hematuria, urinary incontinence, urinary frequency, nocturnal urination.  MS: Negative for joint pain,back and neck pain.  Derm: Negative for rash or itching.  Neuro: Negative for weakness, abnormal sensation, seizure, frequent headaches, memory loss, confusion.  Psych: Negative for anxiety, depression, suicidal ideation, hallucinations.  Endo: Negative for unusual weight change.  Heme: Negative for bruising or bleeding. Allergy: Negative for rash or hives.    Physical Examination:  BP 137/89 mmHg  Pulse 74   Temp(Src) 97.2 F (36.2 C) (Oral)  Ht 5\' 4"  (1.626 m)  Wt 142 lb 3.2 oz (64.501 kg)  BMI 24.40 kg/m2   General: Well-nourished, well-developed in no acute distress.  Head: Normocephalic, atraumatic.   Eyes: Conjunctiva pink, no icterus. Mouth: Oropharyngeal mucosa moist and pink , no lesions erythema or exudate. Neck: Supple without thyromegaly, masses, or lymphadenopathy.  Lungs: Clear to auscultation bilaterally.  Heart: Regular rate and rhythm, no murmurs rubs or gallops.  Abdomen: Bowel sounds are normal, nontender, nondistended, no hepatosplenomegaly or masses, no abdominal bruits or    hernia , no rebound or guarding.   Rectal: not performed Extremities: No lower extremity edema. No clubbing or deformities.  Neuro: Alert and oriented x 4 , grossly normal neurologically.  Skin: Warm and dry, no rash or jaundice.   Psych: Alert and cooperative, normal mood and affect.  Labs: Lab Results  Component Value Date   CREATININE 1.27* 05/14/2015   BUN 23* 05/14/2015   NA 143 05/14/2015   K 4.5 05/14/2015   CL 107 05/14/2015   CO2 29 05/14/2015   Lab Results  Component Value Date   WBC 5.9 05/14/2015   HGB 14.0 05/14/2015   HCT 44.4 05/14/2015   MCV 96.9 05/14/2015   PLT 192 05/14/2015     Imaging Studies: No results found.

## 2015-07-25 NOTE — Patient Instructions (Signed)
1. Take Linzess 114mcg once daily on empty stomach starting five days before your bowel prep for colonoscopy.  2. Colonoscopy as scheduled. See separate instructions.

## 2015-07-30 NOTE — Progress Notes (Signed)
CC'D TO PCP °

## 2015-08-01 ENCOUNTER — Encounter (HOSPITAL_COMMUNITY)
Admission: RE | Disposition: A | Discharge status: Home / Self Care | Payer: Self-pay | Source: Ambulatory Visit | Attending: Internal Medicine

## 2015-08-01 ENCOUNTER — Encounter (HOSPITAL_COMMUNITY): Payer: Self-pay | Admitting: *Deleted

## 2015-08-01 ENCOUNTER — Ambulatory Visit (HOSPITAL_COMMUNITY)
Admission: RE | Admit: 2015-08-01 | Discharge: 2015-08-01 | Disposition: A | Discharge status: Home / Self Care | Payer: Medicare Other | Source: Ambulatory Visit | Attending: Internal Medicine | Admitting: Internal Medicine

## 2015-08-01 DIAGNOSIS — Z96659 Presence of unspecified artificial knee joint: Secondary | ICD-10-CM | POA: Insufficient documentation

## 2015-08-01 DIAGNOSIS — D123 Benign neoplasm of transverse colon: Secondary | ICD-10-CM | POA: Insufficient documentation

## 2015-08-01 DIAGNOSIS — K5730 Diverticulosis of large intestine without perforation or abscess without bleeding: Secondary | ICD-10-CM | POA: Insufficient documentation

## 2015-08-01 DIAGNOSIS — Z1211 Encounter for screening for malignant neoplasm of colon: Secondary | ICD-10-CM | POA: Insufficient documentation

## 2015-08-01 DIAGNOSIS — Z860100 Personal history of colon polyps, unspecified: Secondary | ICD-10-CM | POA: Insufficient documentation

## 2015-08-01 DIAGNOSIS — F319 Bipolar disorder, unspecified: Secondary | ICD-10-CM | POA: Diagnosis not present

## 2015-08-01 DIAGNOSIS — Z79899 Other long term (current) drug therapy: Secondary | ICD-10-CM | POA: Insufficient documentation

## 2015-08-01 DIAGNOSIS — Z87891 Personal history of nicotine dependence: Secondary | ICD-10-CM | POA: Insufficient documentation

## 2015-08-01 DIAGNOSIS — F329 Major depressive disorder, single episode, unspecified: Secondary | ICD-10-CM | POA: Diagnosis not present

## 2015-08-01 HISTORY — PX: COLONOSCOPY: SHX5424

## 2015-08-01 SURGERY — COLONOSCOPY
Anesthesia: Moderate Sedation

## 2015-08-01 MED ORDER — MIDAZOLAM HCL 5 MG/5ML IJ SOLN
INTRAMUSCULAR | Status: DC | PRN
  Administered 2015-08-01: 1 mg via INTRAVENOUS
  Administered 2015-08-01: 2 mg via INTRAVENOUS
  Administered 2015-08-01 (×3): 1 mg via INTRAVENOUS

## 2015-08-01 MED ORDER — SODIUM CHLORIDE 0.9 % IV SOLN
INTRAVENOUS | Status: DC
  Administered 2015-08-01: 13:00:00 via INTRAVENOUS

## 2015-08-01 MED ORDER — SIMETHICONE 40 MG/0.6ML PO SUSP
ORAL | Status: DC | PRN
  Administered 2015-08-01: 14:00:00

## 2015-08-01 MED ORDER — ONDANSETRON HCL 4 MG/2ML IJ SOLN
INTRAMUSCULAR | Status: DC | PRN
  Administered 2015-08-01: 4 mg via INTRAVENOUS

## 2015-08-01 MED ORDER — MIDAZOLAM HCL 5 MG/5ML IJ SOLN
INTRAMUSCULAR | Status: AC
  Filled 2015-08-01: qty 10

## 2015-08-01 MED ORDER — MEPERIDINE HCL 100 MG/ML IJ SOLN
INTRAMUSCULAR | Status: DC | PRN
  Administered 2015-08-01: 50 mg via INTRAVENOUS

## 2015-08-01 MED ORDER — MEPERIDINE HCL 100 MG/ML IJ SOLN
INTRAMUSCULAR | Status: AC
  Filled 2015-08-01: qty 2

## 2015-08-01 MED ORDER — ONDANSETRON HCL 4 MG/2ML IJ SOLN
INTRAMUSCULAR | Status: AC
  Filled 2015-08-01: qty 2

## 2015-08-01 NOTE — Discharge Instructions (Signed)
°Colonoscopy °Discharge Instructions ° °Read the instructions outlined below and refer to this sheet in the next few weeks. These discharge instructions provide you with general information on caring for yourself after you leave the hospital. Your doctor may also give you specific instructions. While your treatment has been planned according to the most current medical practices available, unavoidable complications occasionally occur. If you have any problems or questions after discharge, call Dr. Rourk at [phone]. °ACTIVITY °· You may resume your regular activity, but move at a slower pace for the next 24 hours.  °· Take frequent rest periods for the next 24 hours.  °· Walking will help get rid of the air and reduce the bloated feeling in your belly (abdomen).  °· No driving for 24 hours (because of the medicine (anesthesia) used during the test).   °· Do not sign any important legal documents or operate any machinery for 24 hours (because of the anesthesia used during the test).  °NUTRITION °· Drink plenty of fluids.  °· You may resume your normal diet as instructed by your doctor.  °· Begin with a light meal and progress to your normal diet. Heavy or fried foods are harder to digest and may make you feel sick to your stomach (nauseated).  °· Avoid alcoholic beverages for 24 hours or as instructed.  °MEDICATIONS °· You may resume your normal medications unless your doctor tells you otherwise.  °WHAT YOU CAN EXPECT TODAY °· Some feelings of bloating in the abdomen.  °· Passage of more gas than usual.  °· Spotting of blood in your stool or on the toilet paper.  °IF YOU HAD POLYPS REMOVED DURING THE COLONOSCOPY: °· No aspirin products for 7 days or as instructed.  °· No alcohol for 7 days or as instructed.  °· Eat a soft diet for the next 24 hours.  °FINDING OUT THE RESULTS OF YOUR TEST °Not all test results are available during your visit. If your test results are not back during the visit, make an appointment  with your caregiver to find out the results. Do not assume everything is normal if you have not heard from your caregiver or the medical facility. It is important for you to follow up on all of your test results.  °SEEK IMMEDIATE MEDICAL ATTENTION IF: °· You have more than a spotting of blood in your stool.  °· Your belly is swollen (abdominal distention).  °· You are nauseated or vomiting.  °· You have a temperature over 101.  °· You have abdominal pain or discomfort that is severe or gets worse throughout the day.  ° ° °Diverticulosis and colon polyp information provided ° °Further recommendations to follow pending review of pathology report ° ° °Diverticulosis °Diverticulosis is the condition that develops when small pouches (diverticula) form in the wall of your colon. Your colon, or large intestine, is where water is absorbed and stool is formed. The pouches form when the inside layer of your colon pushes through weak spots in the outer layers of your colon. °CAUSES  °No one knows exactly what causes diverticulosis. °RISK FACTORS °· Being older than 50. Your risk for this condition increases with age. Diverticulosis is rare in people younger than 40 years. By age 80, almost everyone has it. °· Eating a low-fiber diet. °· Being frequently constipated. °· Being overweight. °· Not getting enough exercise. °· Smoking. °· Taking over-the-counter pain medicines, like aspirin and ibuprofen. °SYMPTOMS  °Most people with diverticulosis do not have symptoms. °DIAGNOSIS  °  Because diverticulosis often has no symptoms, health care providers often discover the condition during an exam for other colon problems. In many cases, a health care provider will diagnose diverticulosis while using a flexible scope to examine the colon (colonoscopy). °TREATMENT  °If you have never developed an infection related to diverticulosis, you may not need treatment. If you have had an infection before, treatment may include: °· Eating more  fruits, vegetables, and grains. °· Taking a fiber supplement. °· Taking a live bacteria supplement (probiotic). °· Taking medicine to relax your colon. °HOME CARE INSTRUCTIONS  °· Drink at least 6-8 glasses of water each day to prevent constipation. °· Try not to strain when you have a bowel movement. °· Keep all follow-up appointments. °If you have had an infection before:  °· Increase the fiber in your diet as directed by your health care provider or dietitian. °· Take a dietary fiber supplement if your health care provider approves. °· Only take medicines as directed by your health care provider. °SEEK MEDICAL CARE IF:  °· You have abdominal pain. °· You have bloating. °· You have cramps. °· You have not gone to the bathroom in 3 days. °SEEK IMMEDIATE MEDICAL CARE IF:  °· Your pain gets worse. °· Your bloating becomes very bad. °· You have a fever or chills, and your symptoms suddenly get worse. °· You begin vomiting. °· You have bowel movements that are bloody or black. °MAKE SURE YOU: °· Understand these instructions. °· Will watch your condition. °· Will get help right away if you are not doing well or get worse. °  °This information is not intended to replace advice given to you by your health care provider. Make sure you discuss any questions you have with your health care provider. °  °Document Released: 11/22/2003 Document Revised: 03/01/2013 Document Reviewed: 01/19/2013 °Elsevier Interactive Patient Education ©2016 [employer] °Colon Polyps °Polyps are lumps of extra tissue growing inside the body. Polyps can grow in the large intestine (colon). Most colon polyps are noncancerous (benign). However, some colon polyps can become cancerous over time. Polyps that are larger than a pea may be harmful. To be safe, caregivers remove and test all polyps. °CAUSES  °Polyps form when mutations in the genes cause your cells to grow and divide even though no more tissue is needed. °RISK FACTORS °There are a number  of risk factors that can increase your chances of getting colon polyps. They include: °· Being older than 50 years. °· Family history of colon polyps or colon cancer. °· Long-term colon diseases, such as colitis or Crohn disease. °· Being overweight. °· Smoking. °· Being inactive. °· Drinking too much alcohol. °SYMPTOMS  °Most small polyps do not cause symptoms. If symptoms are present, they may include: °· Blood in the stool. The stool may look dark red or black. °· Constipation or diarrhea that lasts longer than 1 week. °DIAGNOSIS °People often do not know they have polyps until their caregiver finds them during a regular checkup. Your caregiver can use 4 tests to check for polyps: °· Digital rectal exam. The caregiver wears gloves and feels inside the rectum. This test would find polyps only in the rectum. °· Barium enema. The caregiver puts a liquid called barium into your rectum before taking X-rays of your colon. Barium makes your colon look white. Polyps are dark, so they are easy to see in the X-ray pictures. °· Sigmoidoscopy. A thin, flexible tube (sigmoidoscope) is placed into your rectum. The sigmoidoscope has a   light and tiny camera in it. The caregiver uses the sigmoidoscope to look at the last third of your colon. °· Colonoscopy. This test is like sigmoidoscopy, but the caregiver looks at the entire colon. This is the most common method for finding and removing polyps. °TREATMENT  °Any polyps will be removed during a sigmoidoscopy or colonoscopy. The polyps are then tested for cancer. °PREVENTION  °To help lower your risk of getting more colon polyps: °· Eat plenty of fruits and vegetables. Avoid eating fatty foods. °· Do not smoke. °· Avoid drinking alcohol. °· Exercise every day. °· Lose weight if recommended by your caregiver. °· Eat plenty of calcium and folate. Foods that are rich in calcium include milk, cheese, and broccoli. Foods that are rich in folate include chickpeas, kidney beans, and  spinach. °HOME CARE INSTRUCTIONS °Keep all follow-up appointments as directed by your caregiver. You may need periodic exams to check for polyps. °SEEK MEDICAL CARE IF: °You notice bleeding during a bowel movement. °  °This information is not intended to replace advice given to you by your health care provider. Make sure you discuss any questions you have with your health care provider. °  °Document Released: 11/21/2003 Document Revised: 03/17/2014 Document Reviewed: 05/06/2011 °Elsevier Interactive Patient Education ©2016 Elsevier Inc. ° °

## 2015-08-01 NOTE — Op Note (Signed)
[facility] Patient Name: [name] Procedure Date: 08/01/2015 1:21 PM MRN: [id number] Date of Birth: 10-Mar-1941 Attending MD: Norvel Richards , MD CSN: [id number] Age: 75 Admit Type: Outpatient Procedure:                Colonoscopy with snare polypectomy Indications:              High risk colon cancer surveillance: Personal                            history of non-advanced adenoma Providers:                Norvel Richards, MD, Gwenlyn Fudge, RN, Georgeann Oppenheim, Technician Referring MD:              Medicines:                Midazolam 6 mg IV, Meperidine 50 mg IV, Ondansetron                            4 mg IV Complications:            No immediate complications. Estimated Blood Loss:     Estimated blood loss was minimal. Procedure:                Pre-Anesthesia Assessment:                           - Prior to the procedure, a History and Physical                            was performed, and patient medications and                            allergies were reviewed. The patient's tolerance of                            previous anesthesia was also reviewed. The risks                            and benefits of the procedure and the sedation                            options and risks were discussed with the patient.                            All questions were answered, and informed consent                            was obtained. Prior Anticoagulants: The patient has                            taken no previous anticoagulant or antiplatelet  agents. ASA Grade Assessment: II - A patient with                            mild systemic disease. After reviewing the risks                            and benefits, the patient was deemed in                            satisfactory condition to undergo the procedure.                           After obtaining informed consent, the colonoscope                            was  passed under direct vision. Throughout the                            procedure, the patient's blood pressure, pulse, and                            oxygen saturations were monitored continuously. The                            [id number]) scope was introduced through                            the anus and advanced to the the cecum, identified                            by appendiceal orifice and ileocecal valve. The                            colonoscopy was performed without difficulty. The                            patient tolerated the procedure well. The quality                            of the bowel preparation was adequate. The                            ileocecal valve, appendiceal orifice, and rectum                            were photographed. Scope In: 1:43:35 PM Scope Out: 2:02:57 PM Scope Withdrawal Time: 0 hours 8 minutes 11 seconds  Total Procedure Duration: 0 hours 19 minutes 22 seconds  Findings:      The perianal and digital rectal examinations were normal.      Multiple medium-mouthed diverticula were found in the sigmoid colon.       Estimated blood loss: none.      A 6 mm polyp was found in the splenic flexure. The polyp was  pedunculated. The polyp was removed with a cold snare. Resection and       retrieval were complete. Estimated blood loss was minimal. Impression:               - Diverticulosis in the sigmoid colon.                           - One 6 mm polyp at the splenic flexure, removed                            with a cold snare. Resected and retrieved. Moderate Sedation:      Moderate (conscious) sedation was administered by the endoscopy nurse       and supervised by the endoscopist. The following parameters were       monitored: oxygen saturation, heart rate, blood pressure, respiratory       rate, EKG, adequacy of pulmonary ventilation, and response to care.       Total physician intraservice time was 26 minutes. Recommendation:            - Patient has a contact number available for                            emergencies. The signs and symptoms of potential                            delayed complications were discussed with the                            patient. Return to normal activities tomorrow.                            Written discharge instructions were provided to the                            patient.                           - Advance diet as tolerated today.                           - Continue present medications.                           - Await pathology results.                           - Repeat colonoscopy date to be determined after                            pending pathology results are reviewed for                            surveillance based on pathology results.                           - Return to GI office PRN. Procedure Code(s):        ---  Professional ---                           [phone], Colonoscopy, flexible; with removal of                            tumor(s), polyp(s), or other lesion(s) by snare                            technique                           99152, Moderate sedation services provided by the                            same physician or other qualified health care                            professional performing the diagnostic or                            therapeutic service that the sedation supports,                            requiring the presence of an independent trained                            observer to assist in the monitoring of the                            patient's level of consciousness and physiological                            status; initial 15 minutes of intraservice time,                            patient age 71 years or older                           [id number], Moderate sedation services; each additional                            15 minutes intraservice time Diagnosis Code(s):        --- Professional ---                           Z86.010,  Personal history of colonic polyps                           D12.3, Benign neoplasm of transverse colon (hepatic                            flexure or splenic flexure)                           K57.30, Diverticulosis of large intestine  without                            perforation or abscess without bleeding CPT copyright 2016 American Medical Association. All rights reserved. The codes documented in this report are preliminary and upon coder review may  be revised to meet current compliance requirements. Cristopher Estimable. Andry Bogden, MD Norvel Richards, MD 08/01/2015 2:11:23 PM This report has been signed electronically. Number of Addenda: 0

## 2015-08-01 NOTE — Interval H&P Note (Signed)
History and Physical Interval Note:  08/01/2015 1:33 PM  [name]  has presented today for surgery, with the diagnosis of history of colon polyps  The various methods of treatment have been discussed with the patient and family. After consideration of risks, benefits and other options for treatment, the patient has consented to  Procedure(s) with comments: COLONOSCOPY (N/A) - 1345 as a surgical intervention .  The patient's history has been reviewed, patient examined, no change in status, stable for surgery.  I have reviewed the patient's chart and labs.  Questions were answered to the patient's satisfaction.     [name]  No change. Surveillance colonoscopy plan.  The risks, benefits, limitations, alternatives and imponderables have been reviewed with the patient. Questions have been answered. All parties are agreeable.

## 2015-08-01 NOTE — H&P (View-Only) (Signed)
Primary Care Physician:  Denny Levy, [place]  Primary Gastroenterologist:  Garfield Cornea, MD   Chief Complaint  Patient presents with  . Colonoscopy    HPI:  [name] is a 75 y.o. female here female with History of adenomatous colon polyps who presents to schedule surveillance colonoscopy. Last colonoscopy in 2012. She had left-sided diverticula, pedunculated polyp removed from the splenic flexure which was adenomatous.  Overall she has been doing well from a GI standpoint. She has had increased issues with constipation now that she is on tramadol regularly for her scoliosis and kyphosis. She has been using prunes with good relief. Denies blood in the stool or melena. Denies abdominal pain. No heartburn or indigestion. No unintentional weight loss. Recently had some chest pain and was evaluated by cardiology with negative stress test. Denies chest pain related to meals. No dysphagia.  Current Outpatient Prescriptions  Medication Sig Dispense Refill  . alendronate (FOSAMAX) 70 MG tablet Take 70 mg by mouth every Wednesday. Take with a full glass of water on an empty stomach.    . Cholecalciferol (VITAMIN D-3 PO) Take 1 capsule by mouth daily.    . Estradiol (VAGIFEM) 10 MCG TABS vaginal tablet Place 1 each vaginally once a week. Reported on 07/25/2015    . propranolol (INDERAL) 20 MG tablet Take 20 mg by mouth daily.    . traMADol (ULTRAM) 50 MG tablet Take 1 tablet (50 mg total) by mouth every 6 (six) hours as needed. 20 tablet 0   No current facility-administered medications for this visit.    Allergies as of 07/25/2015 - Review Complete 07/25/2015  Allergen Reaction Noted  . Amoxicillin Nausea And Vomiting 03/31/2012  . Clindamycin/lincomycin Nausea And Vomiting 03/31/2012  . Morphine and related Nausea And Vomiting 07/03/2010  . Other  03/15/2011  . Oxycodone Nausea Only 06/26/2015    Past Medical History  Diagnosis Date  . Hypotension   . Depression   . Tubular adenoma  of colon 07/29/2007    Dr. Faith Rogue colonoscopy, previous focally adenomatous cecal polyp 2006  . Diverticulosis of colon 07/29/2007  . Bipolar affective disorder [name])     Past Surgical History  Procedure Laterality Date  . Knee arthroscopy    . Total knee arthroplasty    . Spinal tumor removal    . Colonoscopy  2012    Dr. Gala Romney: Tubular adenoma, diverticulosis.    Family History  Problem Relation Age of Onset  . Kidney disease Mother   . Colitis      ? type in sibling  . Colon cancer Neg Hx     Social History   Social History  . Marital Status: Married    Spouse Name: N/A  . Number of Children: N/A  . Years of Education: N/A   Occupational History  . retired    Social History Main Topics  . Smoking status: Former Smoker    Types: Cigarettes  . Smokeless tobacco: Not on file  . Alcohol Use: No  . Drug Use: No  . Sexual Activity: Not on file   Other Topics Concern  . Not on file   Social History Narrative      ROS:  General: Negative for anorexia, weight loss, fever, chills, fatigue, weakness. Eyes: Negative for vision changes.  ENT: Negative for hoarseness, difficulty swallowing , nasal congestion. CV: Negative for chest pain, angina, palpitations, dyspnea on exertion, peripheral edema.  Respiratory: Negative for dyspnea at rest, dyspnea on exertion, cough, sputum, wheezing.  GI:  See history of present illness. GU:  Negative for dysuria, hematuria, urinary incontinence, urinary frequency, nocturnal urination.  MS: Negative for joint pain,back and neck pain.  Derm: Negative for rash or itching.  Neuro: Negative for weakness, abnormal sensation, seizure, frequent headaches, memory loss, confusion.  Psych: Negative for anxiety, depression, suicidal ideation, hallucinations.  Endo: Negative for unusual weight change.  Heme: Negative for bruising or bleeding. Allergy: Negative for rash or hives.    Physical Examination:  BP 137/89 mmHg  Pulse 74   Temp(Src) 97.2 F (36.2 C) (Oral)  Ht 5\' 4"  (1.626 m)  Wt 142 lb 3.2 oz (64.501 kg)  BMI 24.40 kg/m2   General: Well-nourished, well-developed in no acute distress.  Head: Normocephalic, atraumatic.   Eyes: Conjunctiva pink, no icterus. Mouth: Oropharyngeal mucosa moist and pink , no lesions erythema or exudate. Neck: Supple without thyromegaly, masses, or lymphadenopathy.  Lungs: Clear to auscultation bilaterally.  Heart: Regular rate and rhythm, no murmurs rubs or gallops.  Abdomen: Bowel sounds are normal, nontender, nondistended, no hepatosplenomegaly or masses, no abdominal bruits or    hernia , no rebound or guarding.   Rectal: not performed Extremities: No lower extremity edema. No clubbing or deformities.  Neuro: Alert and oriented x 4 , grossly normal neurologically.  Skin: Warm and dry, no rash or jaundice.   Psych: Alert and cooperative, normal mood and affect.  Labs: Lab Results  Component Value Date   CREATININE 1.27* 05/14/2015   BUN 23* 05/14/2015   NA 143 05/14/2015   K 4.5 05/14/2015   CL 107 05/14/2015   CO2 29 05/14/2015   Lab Results  Component Value Date   WBC 5.9 05/14/2015   HGB 14.0 05/14/2015   HCT 44.4 05/14/2015   MCV 96.9 05/14/2015   PLT 192 05/14/2015     Imaging Studies: No results found.

## 2015-08-05 ENCOUNTER — Encounter: Payer: Self-pay | Admitting: Internal Medicine

## 2015-08-07 ENCOUNTER — Encounter (HOSPITAL_COMMUNITY): Payer: Self-pay | Admitting: Internal Medicine

## 2015-08-07 DIAGNOSIS — M5136 Other intervertebral disc degeneration, lumbar region: Secondary | ICD-10-CM | POA: Diagnosis not present

## 2015-08-07 DIAGNOSIS — M47816 Spondylosis without myelopathy or radiculopathy, lumbar region: Secondary | ICD-10-CM | POA: Diagnosis not present

## 2015-08-07 DIAGNOSIS — M545 Low back pain: Secondary | ICD-10-CM | POA: Diagnosis not present

## 2015-08-09 ENCOUNTER — Ambulatory Visit (INDEPENDENT_AMBULATORY_CARE_PROVIDER_SITE_OTHER): Payer: Medicare Other | Admitting: Otolaryngology

## 2015-08-09 DIAGNOSIS — H6123 Impacted cerumen, bilateral: Secondary | ICD-10-CM | POA: Diagnosis not present

## 2015-08-17 DIAGNOSIS — Z1231 Encounter for screening mammogram for malignant neoplasm of breast: Secondary | ICD-10-CM | POA: Diagnosis not present

## 2015-08-17 DIAGNOSIS — M85852 Other specified disorders of bone density and structure, left thigh: Secondary | ICD-10-CM | POA: Diagnosis not present

## 2015-08-17 DIAGNOSIS — M85851 Other specified disorders of bone density and structure, right thigh: Secondary | ICD-10-CM | POA: Diagnosis not present

## 2015-08-17 DIAGNOSIS — M85832 Other specified disorders of bone density and structure, left forearm: Secondary | ICD-10-CM | POA: Diagnosis not present

## 2015-08-20 DIAGNOSIS — R300 Dysuria: Secondary | ICD-10-CM | POA: Diagnosis not present

## 2015-08-20 DIAGNOSIS — E039 Hypothyroidism, unspecified: Secondary | ICD-10-CM | POA: Diagnosis not present

## 2015-08-20 DIAGNOSIS — N390 Urinary tract infection, site not specified: Secondary | ICD-10-CM | POA: Diagnosis not present

## 2015-08-20 DIAGNOSIS — I10 Essential (primary) hypertension: Secondary | ICD-10-CM | POA: Diagnosis not present

## 2015-08-28 ENCOUNTER — Other Ambulatory Visit: Payer: Self-pay | Admitting: Obstetrics & Gynecology

## 2015-08-28 DIAGNOSIS — Z124 Encounter for screening for malignant neoplasm of cervix: Secondary | ICD-10-CM | POA: Diagnosis not present

## 2015-08-28 DIAGNOSIS — N760 Acute vaginitis: Secondary | ICD-10-CM | POA: Diagnosis not present

## 2015-08-29 LAB — CYTOLOGY - PAP

## 2015-09-21 DIAGNOSIS — N390 Urinary tract infection, site not specified: Secondary | ICD-10-CM | POA: Diagnosis not present

## 2015-09-21 DIAGNOSIS — R300 Dysuria: Secondary | ICD-10-CM | POA: Diagnosis not present

## 2015-10-31 ENCOUNTER — Ambulatory Visit (INDEPENDENT_AMBULATORY_CARE_PROVIDER_SITE_OTHER): Payer: Medicare Other | Admitting: Ophthalmology

## 2015-10-31 DIAGNOSIS — H34821 Venous engorgement, right eye: Secondary | ICD-10-CM | POA: Diagnosis not present

## 2015-10-31 DIAGNOSIS — H33303 Unspecified retinal break, bilateral: Secondary | ICD-10-CM | POA: Diagnosis not present

## 2015-10-31 DIAGNOSIS — H43813 Vitreous degeneration, bilateral: Secondary | ICD-10-CM

## 2015-10-31 DIAGNOSIS — H2513 Age-related nuclear cataract, bilateral: Secondary | ICD-10-CM | POA: Diagnosis not present

## 2015-11-05 DIAGNOSIS — M47816 Spondylosis without myelopathy or radiculopathy, lumbar region: Secondary | ICD-10-CM | POA: Diagnosis not present

## 2015-11-05 DIAGNOSIS — M545 Low back pain: Secondary | ICD-10-CM | POA: Diagnosis not present

## 2015-11-05 DIAGNOSIS — M5136 Other intervertebral disc degeneration, lumbar region: Secondary | ICD-10-CM | POA: Diagnosis not present

## 2015-12-03 ENCOUNTER — Telehealth: Payer: Self-pay

## 2015-12-03 NOTE — Telephone Encounter (Signed)
Pt is aware.  

## 2015-12-03 NOTE — Telephone Encounter (Signed)
Would not hesitate to take MiraLAX once daily on a regular basis. Can take twice daily on occasion as needed.

## 2015-12-03 NOTE — Telephone Encounter (Signed)
Tried to call with no answer  

## 2015-12-03 NOTE — Telephone Encounter (Signed)
Pt is calling because she is having to take tramadol for pain and now she is having sever constipation. She is wanting to know if she can take miralax and how much. Please advise

## 2015-12-19 DIAGNOSIS — Z23 Encounter for immunization: Secondary | ICD-10-CM | POA: Diagnosis not present

## 2015-12-22 ENCOUNTER — Emergency Department (HOSPITAL_COMMUNITY)
Admission: EM | Admit: 2015-12-22 | Discharge: 2015-12-22 | Disposition: A | Discharge status: Home / Self Care | Payer: Medicare Other | Attending: Emergency Medicine | Admitting: Emergency Medicine

## 2015-12-22 ENCOUNTER — Encounter (HOSPITAL_COMMUNITY): Payer: Self-pay | Admitting: *Deleted

## 2015-12-22 DIAGNOSIS — N3090 Cystitis, unspecified without hematuria: Secondary | ICD-10-CM

## 2015-12-22 DIAGNOSIS — Z79899 Other long term (current) drug therapy: Secondary | ICD-10-CM | POA: Diagnosis not present

## 2015-12-22 DIAGNOSIS — N3001 Acute cystitis with hematuria: Secondary | ICD-10-CM | POA: Insufficient documentation

## 2015-12-22 DIAGNOSIS — R339 Retention of urine, unspecified: Secondary | ICD-10-CM | POA: Diagnosis present

## 2015-12-22 DIAGNOSIS — Z87891 Personal history of nicotine dependence: Secondary | ICD-10-CM | POA: Diagnosis not present

## 2015-12-22 DIAGNOSIS — R319 Hematuria, unspecified: Secondary | ICD-10-CM

## 2015-12-22 LAB — URINE MICROSCOPIC-ADD ON
Bacteria, UA: NONE SEEN
WBC UA: NONE SEEN WBC/hpf (ref 0–5)

## 2015-12-22 LAB — BASIC METABOLIC PANEL
Anion gap: 8 (ref 5–15)
BUN: 30 mg/dL — AB (ref 6–20)
CALCIUM: 9.6 mg/dL (ref 8.9–10.3)
CHLORIDE: 104 mmol/L (ref 101–111)
CO2: 24 mmol/L (ref 22–32)
CREATININE: 1.35 mg/dL — AB (ref 0.44–1.00)
GFR, EST AFRICAN AMERICAN: 43 mL/min — AB (ref >60)
GFR, EST NON AFRICAN AMERICAN: 37 mL/min — AB (ref >60)
Glucose, Bld: 96 mg/dL (ref 65–99)
Potassium: 3.9 mmol/L (ref 3.5–5.1)
SODIUM: 136 mmol/L (ref 135–145)

## 2015-12-22 LAB — URINALYSIS, ROUTINE W REFLEX MICROSCOPIC
BILIRUBIN URINE: NEGATIVE
Glucose, UA: NEGATIVE mg/dL
KETONES UR: NEGATIVE mg/dL
Leukocytes, UA: NEGATIVE
NITRITE: NEGATIVE
PROTEIN: NEGATIVE mg/dL
Specific Gravity, Urine: <1.005 — ABNORMAL LOW (ref 1.005–1.030)
pH: 6 (ref 5.0–8.0)

## 2015-12-22 LAB — CBC
HCT: 41.5 % (ref 36.0–46.0)
HEMOGLOBIN: 13.6 g/dL (ref 12.0–15.0)
MCH: 31.3 pg (ref 26.0–34.0)
MCHC: 32.8 g/dL (ref 30.0–36.0)
MCV: 95.4 fL (ref 78.0–100.0)
PLATELETS: 176 10*3/uL (ref 150–400)
RBC: 4.35 MIL/uL (ref 3.87–5.11)
RDW: 13.3 % (ref 11.5–15.5)
WBC: 7.5 10*3/uL (ref 4.0–10.5)

## 2015-12-22 MED ORDER — CEPHALEXIN 500 MG PO CAPS: 500.0000 mg | ORAL_CAPSULE | Freq: Four times a day (QID) | ORAL | 0 refills | Status: DC

## 2015-12-22 NOTE — Discharge Instructions (Signed)
Take all antibiotics as prescribed.  Recheck with your urologist this week.

## 2015-12-22 NOTE — ED Notes (Signed)
Pt reports taking miralax over a week for constipation, normal BM today. Has had minimal urine output with urge X2 days, last normal urination was "a couple days ago". Pt denies N/V/D, fever, dysuria, hematuria, or flank pain. Past hx of kidney stones and UTI.

## 2015-12-22 NOTE — ED Triage Notes (Signed)
Pt states when she tries to urinate she only urinates small amounts x 2 nights. Pt has a hx of multiple uti's and kidney stones but pt denies any pain anywhere and denies fever.

## 2015-12-22 NOTE — ED Notes (Signed)
Bladder scan indicated 105 mL

## 2015-12-22 NOTE — ED Provider Notes (Signed)
[facility] DEPT Provider Note   CSN: [id number] Arrival date & time: 12/22/15  1906     History   Chief Complaint Chief Complaint  Patient presents with  . Urinary Retention    HPI AALEEYAH BIAS is a 75 y.o. female. Presents today stating that she feels like she needs to urinate and has not been able to fully void for 2 days. She denies any pain in her abdomen or flank. She denies any pain with urination, frequency of urination, fever, or chills. She states that she has had some S patient  HPI  Past Medical History:  Diagnosis Date  . Bipolar affective disorder ([place])   . Depression   . Diverticulosis of colon 07/29/2007  . Hypotension   . Tubular adenoma of colon 07/29/2007   Dr. Faith Rogue colonoscopy, previous focally adenomatous cecal polyp 2006    Patient Active Problem List   Diagnosis Date Noted  . History of colonic polyps   . Diverticulosis of colon without hemorrhage   . Chest pain 04/01/2012  . Tubular adenoma of colon 07/08/2010  . Constipation 01/14/2010  . DEGENERATIVE DISC DISEASE 01/09/2010  . OSTEOPENIA 01/09/2010    Past Surgical History:  Procedure Laterality Date  . COLONOSCOPY  2012   Dr. Gala Romney: Tubular adenoma, diverticulosis.  . COLONOSCOPY N/A 08/01/2015   Procedure: COLONOSCOPY;  Surgeon: Daneil Dolin, MD;  Location: [facility];  Service: Endoscopy;  Laterality: N/A;  1345  . KNEE ARTHROSCOPY    . spinal tumor removal    . TOTAL KNEE ARTHROPLASTY      OB History    No data available       Home Medications    Prior to Admission medications   Medication Sig Start Date End Date Taking? Authorizing Provider  alendronate (FOSAMAX) 70 MG tablet Take 70 mg by mouth every Wednesday. Take with a full glass of water on an empty stomach.   Yes Historical Provider, MD  Cholecalciferol (VITAMIN D-3 PO) Take 1 capsule by mouth daily.   Yes Historical Provider, MD  Estradiol (VAGIFEM) 10 MCG TABS vaginal tablet Place 10 mcg vaginally  once a week.    Yes Historical Provider, MD  propranolol (INDERAL) 20 MG tablet Take 20 mg by mouth daily. 01/06/14  Yes Historical Provider, MD  traMADol (ULTRAM) 50 MG tablet Take 1 tablet (50 mg total) by mouth every 6 (six) hours as needed. Patient taking differently: Take 50 mg by mouth every 6 (six) hours as needed for moderate pain.  06/14/13  Yes Milton Ferguson, MD    Family History Family History  Problem Relation Age of Onset  . Kidney disease Mother   . Colitis      ? type in sibling  . Colon cancer Neg Hx     Social History Social History  Substance Use Topics  . Smoking status: Former Smoker    Types: Cigarettes  . Smokeless tobacco: Never Used  . Alcohol use No     Allergies   Amoxicillin; Clindamycin/lincomycin; Morphine and related; Other; and Oxycodone   Review of Systems Review of Systems  All other systems reviewed and are negative.    Physical Exam Updated Vital Signs BP (!) 164/108   Pulse 72   Temp 97.7 F (36.5 C) (Temporal)   Resp 18   Ht 5' 5.5" (1.664 m)   Wt 62.6 kg   SpO2 97%   BMI 22.62 kg/m   Physical Exam  Constitutional: She is oriented to person, place,  and time. She appears well-developed and well-nourished. No distress.  HENT:  Head: Normocephalic and atraumatic.  Right Ear: External ear normal.  Left Ear: External ear normal.  Nose: Nose normal.  Eyes: Conjunctivae and EOM are normal. Pupils are equal, round, and reactive to light.  Neck: Normal range of motion. Neck supple.  Cardiovascular: Normal rate and regular rhythm.   Pulmonary/Chest: Effort normal.  Abdominal: Soft. Bowel sounds are normal. She exhibits no mass. There is no tenderness. There is no guarding.  Genitourinary:  Genitourinary Comments: Rectal exam performed and small amount of soft stool in rectal vault  Musculoskeletal: Normal range of motion.  Neurological: She is alert and oriented to person, place, and time. She exhibits normal muscle tone.  Coordination normal.  Skin: Skin is warm and dry.  Psychiatric: She has a normal mood and affect. Her behavior is normal. Thought content normal.  Nursing note and vitals reviewed.    ED Treatments / Results  Labs (all labs ordered are listed, but only abnormal results are displayed) Labs Reviewed  BASIC METABOLIC PANEL - Abnormal; Notable for the following:       Result Value   BUN 30 (*)    Creatinine, Ser 1.35 (*)    GFR calc non Af Amer 37 (*)    GFR calc Af Amer 43 (*)    All other components within normal limits  URINALYSIS, ROUTINE W REFLEX MICROSCOPIC (NOT AT [facility]) - Abnormal; Notable for the following:    Specific Gravity, Urine <1.005 (*)    Hgb urine dipstick LARGE (*)    All other components within normal limits  URINE MICROSCOPIC-ADD ON - Abnormal; Notable for the following:    Squamous Epithelial / LPF 0-5 (*)    All other components within normal limits  CBC    EKG  EKG Interpretation None       Radiology No results found.  Procedures Procedures (including critical care time)  Medications Ordered in ED Medications - No data to display   Initial Impression / Assessment and Plan / ED Course  I have reviewed the triage vital signs and the nursing notes.  Pertinent labs & imaging results that were available during my care of the patient were reviewed by me and considered in my medical decision making (see chart for details).  Clinical Course   Patient with sensation of difficulty voiding but no urinary retention.  Patient with urine positive for blood but nor report of rbc on micro. Plan culture and treat - patien has urologist and advised follow up this week.   Final Clinical Impressions(s) / ED Diagnoses   Final diagnoses:  Hematuria, unspecified type    New Prescriptions New Prescriptions   No medications on file     Pattricia Boss, MD 12/22/15 2142

## 2015-12-22 NOTE — ED Notes (Signed)
Pt had 22mL post void residual.

## 2016-02-19 DIAGNOSIS — M545 Low back pain: Secondary | ICD-10-CM | POA: Diagnosis not present

## 2016-02-19 DIAGNOSIS — M5136 Other intervertebral disc degeneration, lumbar region: Secondary | ICD-10-CM | POA: Diagnosis not present

## 2016-02-19 DIAGNOSIS — M47816 Spondylosis without myelopathy or radiculopathy, lumbar region: Secondary | ICD-10-CM | POA: Diagnosis not present

## 2016-06-03 DIAGNOSIS — M47816 Spondylosis without myelopathy or radiculopathy, lumbar region: Secondary | ICD-10-CM | POA: Diagnosis not present

## 2016-06-03 DIAGNOSIS — M5136 Other intervertebral disc degeneration, lumbar region: Secondary | ICD-10-CM | POA: Diagnosis not present

## 2016-06-03 DIAGNOSIS — M545 Low back pain: Secondary | ICD-10-CM | POA: Diagnosis not present

## 2016-06-09 DIAGNOSIS — R339 Retention of urine, unspecified: Secondary | ICD-10-CM | POA: Diagnosis not present

## 2016-06-09 DIAGNOSIS — R300 Dysuria: Secondary | ICD-10-CM | POA: Diagnosis not present

## 2016-06-24 DIAGNOSIS — E559 Vitamin D deficiency, unspecified: Secondary | ICD-10-CM | POA: Diagnosis not present

## 2016-06-24 DIAGNOSIS — E7800 Pure hypercholesterolemia, unspecified: Secondary | ICD-10-CM | POA: Diagnosis not present

## 2016-06-24 DIAGNOSIS — M1990 Unspecified osteoarthritis, unspecified site: Secondary | ICD-10-CM | POA: Diagnosis not present

## 2016-06-24 DIAGNOSIS — Z6824 Body mass index (BMI) 24.0-24.9, adult: Secondary | ICD-10-CM | POA: Diagnosis not present

## 2016-06-24 DIAGNOSIS — M25571 Pain in right ankle and joints of right foot: Secondary | ICD-10-CM | POA: Diagnosis not present

## 2016-06-24 DIAGNOSIS — R5383 Other fatigue: Secondary | ICD-10-CM | POA: Diagnosis not present

## 2016-06-24 DIAGNOSIS — G894 Chronic pain syndrome: Secondary | ICD-10-CM | POA: Diagnosis not present

## 2016-07-08 DIAGNOSIS — Z0001 Encounter for general adult medical examination with abnormal findings: Secondary | ICD-10-CM | POA: Diagnosis not present

## 2016-07-08 DIAGNOSIS — M25571 Pain in right ankle and joints of right foot: Secondary | ICD-10-CM | POA: Diagnosis not present

## 2016-07-08 DIAGNOSIS — N183 Chronic kidney disease, stage 3 (moderate): Secondary | ICD-10-CM | POA: Diagnosis not present

## 2016-07-08 DIAGNOSIS — E559 Vitamin D deficiency, unspecified: Secondary | ICD-10-CM | POA: Diagnosis not present

## 2016-07-08 DIAGNOSIS — Z6825 Body mass index (BMI) 25.0-25.9, adult: Secondary | ICD-10-CM | POA: Diagnosis not present

## 2016-07-29 DIAGNOSIS — N390 Urinary tract infection, site not specified: Secondary | ICD-10-CM | POA: Diagnosis not present

## 2016-07-29 DIAGNOSIS — N183 Chronic kidney disease, stage 3 (moderate): Secondary | ICD-10-CM | POA: Diagnosis not present

## 2016-07-29 DIAGNOSIS — R8299 Other abnormal findings in urine: Secondary | ICD-10-CM | POA: Diagnosis not present

## 2016-07-29 DIAGNOSIS — Z8639 Personal history of other endocrine, nutritional and metabolic disease: Secondary | ICD-10-CM | POA: Diagnosis not present

## 2016-07-29 DIAGNOSIS — I10 Essential (primary) hypertension: Secondary | ICD-10-CM | POA: Diagnosis not present

## 2016-08-18 DIAGNOSIS — Z1231 Encounter for screening mammogram for malignant neoplasm of breast: Secondary | ICD-10-CM | POA: Diagnosis not present

## 2016-10-03 DIAGNOSIS — K219 Gastro-esophageal reflux disease without esophagitis: Secondary | ICD-10-CM | POA: Diagnosis not present

## 2016-10-03 DIAGNOSIS — Z6824 Body mass index (BMI) 24.0-24.9, adult: Secondary | ICD-10-CM | POA: Diagnosis not present

## 2016-10-03 DIAGNOSIS — B372 Candidiasis of skin and nail: Secondary | ICD-10-CM | POA: Diagnosis not present

## 2016-11-12 DIAGNOSIS — Z6823 Body mass index (BMI) 23.0-23.9, adult: Secondary | ICD-10-CM | POA: Diagnosis not present

## 2016-11-12 DIAGNOSIS — M47816 Spondylosis without myelopathy or radiculopathy, lumbar region: Secondary | ICD-10-CM | POA: Diagnosis not present

## 2016-12-15 DIAGNOSIS — Z23 Encounter for immunization: Secondary | ICD-10-CM | POA: Diagnosis not present

## 2017-02-11 DIAGNOSIS — M5136 Other intervertebral disc degeneration, lumbar region: Secondary | ICD-10-CM | POA: Diagnosis not present

## 2017-02-11 DIAGNOSIS — M47816 Spondylosis without myelopathy or radiculopathy, lumbar region: Secondary | ICD-10-CM | POA: Diagnosis not present

## 2017-02-11 DIAGNOSIS — Z6824 Body mass index (BMI) 24.0-24.9, adult: Secondary | ICD-10-CM | POA: Diagnosis not present

## 2017-04-27 ENCOUNTER — Ambulatory Visit (INDEPENDENT_AMBULATORY_CARE_PROVIDER_SITE_OTHER): Payer: Medicare Other | Admitting: Otolaryngology

## 2017-04-27 DIAGNOSIS — H6123 Impacted cerumen, bilateral: Secondary | ICD-10-CM

## 2017-05-12 DIAGNOSIS — M47816 Spondylosis without myelopathy or radiculopathy, lumbar region: Secondary | ICD-10-CM | POA: Diagnosis not present

## 2017-05-12 DIAGNOSIS — Z6824 Body mass index (BMI) 24.0-24.9, adult: Secondary | ICD-10-CM | POA: Diagnosis not present

## 2017-05-12 DIAGNOSIS — M5136 Other intervertebral disc degeneration, lumbar region: Secondary | ICD-10-CM | POA: Diagnosis not present

## 2017-05-14 DIAGNOSIS — L570 Actinic keratosis: Secondary | ICD-10-CM | POA: Diagnosis not present

## 2017-05-14 DIAGNOSIS — X32XXXD Exposure to sunlight, subsequent encounter: Secondary | ICD-10-CM | POA: Diagnosis not present

## 2017-05-14 DIAGNOSIS — Z1283 Encounter for screening for malignant neoplasm of skin: Secondary | ICD-10-CM | POA: Diagnosis not present

## 2017-05-14 DIAGNOSIS — D225 Melanocytic nevi of trunk: Secondary | ICD-10-CM | POA: Diagnosis not present

## 2017-08-18 DIAGNOSIS — Z6824 Body mass index (BMI) 24.0-24.9, adult: Secondary | ICD-10-CM | POA: Diagnosis not present

## 2017-08-18 DIAGNOSIS — M47816 Spondylosis without myelopathy or radiculopathy, lumbar region: Secondary | ICD-10-CM | POA: Diagnosis not present

## 2017-08-25 DIAGNOSIS — Z1231 Encounter for screening mammogram for malignant neoplasm of breast: Secondary | ICD-10-CM | POA: Diagnosis not present

## 2017-08-25 DIAGNOSIS — M810 Age-related osteoporosis without current pathological fracture: Secondary | ICD-10-CM | POA: Diagnosis not present

## 2017-08-25 DIAGNOSIS — M8589 Other specified disorders of bone density and structure, multiple sites: Secondary | ICD-10-CM | POA: Diagnosis not present

## 2017-09-07 DIAGNOSIS — N183 Chronic kidney disease, stage 3 (moderate): Secondary | ICD-10-CM | POA: Diagnosis not present

## 2017-09-14 DIAGNOSIS — Z6824 Body mass index (BMI) 24.0-24.9, adult: Secondary | ICD-10-CM | POA: Diagnosis not present

## 2017-09-14 DIAGNOSIS — Z124 Encounter for screening for malignant neoplasm of cervix: Secondary | ICD-10-CM | POA: Diagnosis not present

## 2017-09-17 DIAGNOSIS — N183 Chronic kidney disease, stage 3 (moderate): Secondary | ICD-10-CM | POA: Diagnosis not present

## 2017-09-17 DIAGNOSIS — Z8639 Personal history of other endocrine, nutritional and metabolic disease: Secondary | ICD-10-CM | POA: Diagnosis not present

## 2017-09-17 DIAGNOSIS — I129 Hypertensive chronic kidney disease with stage 1 through stage 4 chronic kidney disease, or unspecified chronic kidney disease: Secondary | ICD-10-CM | POA: Diagnosis not present

## 2017-09-17 DIAGNOSIS — N390 Urinary tract infection, site not specified: Secondary | ICD-10-CM | POA: Diagnosis not present

## 2017-09-17 DIAGNOSIS — R82991 Hypocitraturia: Secondary | ICD-10-CM | POA: Diagnosis not present

## 2017-09-28 DIAGNOSIS — N183 Chronic kidney disease, stage 3 (moderate): Secondary | ICD-10-CM | POA: Diagnosis not present

## 2017-11-02 DIAGNOSIS — N183 Chronic kidney disease, stage 3 (moderate): Secondary | ICD-10-CM | POA: Diagnosis not present

## 2017-11-24 DIAGNOSIS — Z6823 Body mass index (BMI) 23.0-23.9, adult: Secondary | ICD-10-CM | POA: Diagnosis not present

## 2017-11-24 DIAGNOSIS — M47816 Spondylosis without myelopathy or radiculopathy, lumbar region: Secondary | ICD-10-CM | POA: Diagnosis not present

## 2017-11-24 DIAGNOSIS — M545 Low back pain: Secondary | ICD-10-CM | POA: Diagnosis not present

## 2017-12-07 DIAGNOSIS — E782 Mixed hyperlipidemia: Secondary | ICD-10-CM | POA: Diagnosis not present

## 2017-12-07 DIAGNOSIS — Z6824 Body mass index (BMI) 24.0-24.9, adult: Secondary | ICD-10-CM | POA: Diagnosis not present

## 2017-12-07 DIAGNOSIS — N183 Chronic kidney disease, stage 3 (moderate): Secondary | ICD-10-CM | POA: Diagnosis not present

## 2017-12-07 DIAGNOSIS — K219 Gastro-esophageal reflux disease without esophagitis: Secondary | ICD-10-CM | POA: Diagnosis not present

## 2017-12-07 DIAGNOSIS — B372 Candidiasis of skin and nail: Secondary | ICD-10-CM | POA: Diagnosis not present

## 2017-12-18 DIAGNOSIS — Z23 Encounter for immunization: Secondary | ICD-10-CM | POA: Diagnosis not present

## 2017-12-30 DIAGNOSIS — S82832A Other fracture of upper and lower end of left fibula, initial encounter for closed fracture: Secondary | ICD-10-CM | POA: Diagnosis not present

## 2018-01-11 DIAGNOSIS — S82832A Other fracture of upper and lower end of left fibula, initial encounter for closed fracture: Secondary | ICD-10-CM | POA: Diagnosis not present

## 2018-02-01 DIAGNOSIS — S82832A Other fracture of upper and lower end of left fibula, initial encounter for closed fracture: Secondary | ICD-10-CM | POA: Diagnosis not present

## 2018-02-23 DIAGNOSIS — K219 Gastro-esophageal reflux disease without esophagitis: Secondary | ICD-10-CM | POA: Diagnosis not present

## 2018-02-23 DIAGNOSIS — E7800 Pure hypercholesterolemia, unspecified: Secondary | ICD-10-CM | POA: Diagnosis not present

## 2018-02-23 DIAGNOSIS — N183 Chronic kidney disease, stage 3 (moderate): Secondary | ICD-10-CM | POA: Diagnosis not present

## 2018-02-23 DIAGNOSIS — M25572 Pain in left ankle and joints of left foot: Secondary | ICD-10-CM | POA: Diagnosis not present

## 2018-02-23 DIAGNOSIS — Z13220 Encounter for screening for lipoid disorders: Secondary | ICD-10-CM | POA: Diagnosis not present

## 2018-02-23 DIAGNOSIS — E871 Hypo-osmolality and hyponatremia: Secondary | ICD-10-CM | POA: Diagnosis not present

## 2018-03-01 DIAGNOSIS — Z6824 Body mass index (BMI) 24.0-24.9, adult: Secondary | ICD-10-CM | POA: Diagnosis not present

## 2018-03-01 DIAGNOSIS — Z0001 Encounter for general adult medical examination with abnormal findings: Secondary | ICD-10-CM | POA: Diagnosis not present

## 2018-03-01 DIAGNOSIS — N184 Chronic kidney disease, stage 4 (severe): Secondary | ICD-10-CM | POA: Diagnosis not present

## 2018-03-09 DIAGNOSIS — N183 Chronic kidney disease, stage 3 (moderate): Secondary | ICD-10-CM | POA: Diagnosis not present

## 2018-03-12 DIAGNOSIS — N281 Cyst of kidney, acquired: Secondary | ICD-10-CM | POA: Diagnosis not present

## 2018-03-12 DIAGNOSIS — N184 Chronic kidney disease, stage 4 (severe): Secondary | ICD-10-CM | POA: Diagnosis not present

## 2018-03-15 DIAGNOSIS — Z8639 Personal history of other endocrine, nutritional and metabolic disease: Secondary | ICD-10-CM | POA: Diagnosis not present

## 2018-03-15 DIAGNOSIS — N183 Chronic kidney disease, stage 3 (moderate): Secondary | ICD-10-CM | POA: Diagnosis not present

## 2018-03-15 DIAGNOSIS — R82991 Hypocitraturia: Secondary | ICD-10-CM | POA: Diagnosis not present

## 2018-03-15 DIAGNOSIS — R8281 Pyuria: Secondary | ICD-10-CM | POA: Diagnosis not present

## 2018-03-15 DIAGNOSIS — I129 Hypertensive chronic kidney disease with stage 1 through stage 4 chronic kidney disease, or unspecified chronic kidney disease: Secondary | ICD-10-CM | POA: Diagnosis not present

## 2018-05-31 DIAGNOSIS — N183 Chronic kidney disease, stage 3 (moderate): Secondary | ICD-10-CM | POA: Diagnosis not present

## 2018-06-11 DIAGNOSIS — I129 Hypertensive chronic kidney disease with stage 1 through stage 4 chronic kidney disease, or unspecified chronic kidney disease: Secondary | ICD-10-CM | POA: Diagnosis not present

## 2018-09-06 DIAGNOSIS — Z1231 Encounter for screening mammogram for malignant neoplasm of breast: Secondary | ICD-10-CM | POA: Diagnosis not present

## 2018-09-20 DIAGNOSIS — Z01419 Encounter for gynecological examination (general) (routine) without abnormal findings: Secondary | ICD-10-CM | POA: Diagnosis not present

## 2018-09-20 DIAGNOSIS — Z6824 Body mass index (BMI) 24.0-24.9, adult: Secondary | ICD-10-CM | POA: Diagnosis not present

## 2018-10-11 DIAGNOSIS — N183 Chronic kidney disease, stage 3 (moderate): Secondary | ICD-10-CM | POA: Diagnosis not present

## 2018-10-14 DIAGNOSIS — R82991 Hypocitraturia: Secondary | ICD-10-CM | POA: Diagnosis not present

## 2018-10-14 DIAGNOSIS — I129 Hypertensive chronic kidney disease with stage 1 through stage 4 chronic kidney disease, or unspecified chronic kidney disease: Secondary | ICD-10-CM | POA: Diagnosis not present

## 2018-10-14 DIAGNOSIS — R8281 Pyuria: Secondary | ICD-10-CM | POA: Diagnosis not present

## 2018-10-14 DIAGNOSIS — Z8639 Personal history of other endocrine, nutritional and metabolic disease: Secondary | ICD-10-CM | POA: Diagnosis not present

## 2018-10-14 DIAGNOSIS — N183 Chronic kidney disease, stage 3 (moderate): Secondary | ICD-10-CM | POA: Diagnosis not present

## 2018-12-14 DIAGNOSIS — Z6824 Body mass index (BMI) 24.0-24.9, adult: Secondary | ICD-10-CM | POA: Diagnosis not present

## 2018-12-14 DIAGNOSIS — R251 Tremor, unspecified: Secondary | ICD-10-CM | POA: Diagnosis not present

## 2018-12-14 DIAGNOSIS — K5900 Constipation, unspecified: Secondary | ICD-10-CM | POA: Diagnosis not present

## 2018-12-14 DIAGNOSIS — G894 Chronic pain syndrome: Secondary | ICD-10-CM | POA: Diagnosis not present

## 2018-12-20 DIAGNOSIS — Z23 Encounter for immunization: Secondary | ICD-10-CM | POA: Diagnosis not present

## 2019-01-07 DIAGNOSIS — I10 Essential (primary) hypertension: Secondary | ICD-10-CM | POA: Diagnosis not present

## 2019-01-07 DIAGNOSIS — E782 Mixed hyperlipidemia: Secondary | ICD-10-CM | POA: Diagnosis not present

## 2019-02-07 DIAGNOSIS — E782 Mixed hyperlipidemia: Secondary | ICD-10-CM | POA: Diagnosis not present

## 2019-02-07 DIAGNOSIS — I10 Essential (primary) hypertension: Secondary | ICD-10-CM | POA: Diagnosis not present

## 2019-02-18 DIAGNOSIS — Z96652 Presence of left artificial knee joint: Secondary | ICD-10-CM | POA: Diagnosis not present

## 2019-02-18 DIAGNOSIS — Z471 Aftercare following joint replacement surgery: Secondary | ICD-10-CM | POA: Diagnosis not present

## 2019-03-08 ENCOUNTER — Other Ambulatory Visit: Payer: Self-pay | Admitting: Anesthesiology

## 2019-03-08 DIAGNOSIS — M4120 Other idiopathic scoliosis, site unspecified: Secondary | ICD-10-CM

## 2019-03-20 ENCOUNTER — Other Ambulatory Visit: Payer: Medicare Other

## 2019-03-22 DIAGNOSIS — R82991 Hypocitraturia: Secondary | ICD-10-CM | POA: Diagnosis not present

## 2019-03-22 DIAGNOSIS — Z8639 Personal history of other endocrine, nutritional and metabolic disease: Secondary | ICD-10-CM | POA: Diagnosis not present

## 2019-03-22 DIAGNOSIS — I129 Hypertensive chronic kidney disease with stage 1 through stage 4 chronic kidney disease, or unspecified chronic kidney disease: Secondary | ICD-10-CM | POA: Diagnosis not present

## 2019-03-22 DIAGNOSIS — N183 Chronic kidney disease, stage 3 unspecified: Secondary | ICD-10-CM | POA: Diagnosis not present

## 2019-03-22 DIAGNOSIS — R8281 Pyuria: Secondary | ICD-10-CM | POA: Diagnosis not present

## 2019-03-27 ENCOUNTER — Ambulatory Visit
Admission: RE | Admit: 2019-03-27 | Discharge: 2019-03-27 | Disposition: A | Discharge status: Home / Self Care | Payer: Medicare Other | Source: Ambulatory Visit | Attending: Anesthesiology | Admitting: Anesthesiology

## 2019-03-27 ENCOUNTER — Other Ambulatory Visit: Payer: Self-pay

## 2019-03-27 DIAGNOSIS — M545 Low back pain: Secondary | ICD-10-CM | POA: Diagnosis not present

## 2019-03-27 DIAGNOSIS — M4120 Other idiopathic scoliosis, site unspecified: Secondary | ICD-10-CM

## 2019-03-30 DIAGNOSIS — M4120 Other idiopathic scoliosis, site unspecified: Secondary | ICD-10-CM | POA: Diagnosis not present

## 2019-03-30 DIAGNOSIS — I10 Essential (primary) hypertension: Secondary | ICD-10-CM | POA: Diagnosis not present

## 2019-04-02 ENCOUNTER — Ambulatory Visit: Payer: Medicare Other | Attending: Internal Medicine

## 2019-04-02 DIAGNOSIS — Z23 Encounter for immunization: Secondary | ICD-10-CM | POA: Insufficient documentation

## 2019-04-02 NOTE — Progress Notes (Signed)
   Covid-19 Vaccination Clinic  Name:  MATTIA OSTERMAN    MRN: [id number] DOB: 06/17/1940  04/02/2019  Ms. Clyatt was observed post Covid-19 immunization for 15 minutes without incidence. She was provided with Vaccine Information Sheet and instruction to access the V-Safe system.   Ms. Archambeau was instructed to call 911 with any severe reactions post vaccine: Marland Kitchen Difficulty breathing  . Swelling of your face and throat  . A fast heartbeat  . A bad rash all over your body  . Dizziness and weakness    Immunizations Administered    Name Date Dose VIS Date Route   Pfizer COVID-19 Vaccine 04/02/2019 12:29 PM 0.3 mL 02/18/2019 Intramuscular   Manufacturer: [place]   Lot: [id number]   [place]: [id number]

## 2019-04-08 DIAGNOSIS — E7849 Other hyperlipidemia: Secondary | ICD-10-CM | POA: Diagnosis not present

## 2019-04-08 DIAGNOSIS — I10 Essential (primary) hypertension: Secondary | ICD-10-CM | POA: Diagnosis not present

## 2019-04-11 ENCOUNTER — Ambulatory Visit: Payer: Medicare Other

## 2019-04-23 ENCOUNTER — Ambulatory Visit: Payer: Medicare Other

## 2019-04-24 ENCOUNTER — Ambulatory Visit: Payer: Medicare Other | Attending: Internal Medicine

## 2019-04-24 DIAGNOSIS — Z23 Encounter for immunization: Secondary | ICD-10-CM

## 2019-04-24 NOTE — Progress Notes (Signed)
   Covid-19 Vaccination Clinic  Name:  JODELL WEITMAN    MRN: [id number] DOB: 1940/12/21  04/24/2019  Ms. Ickes was observed post Covid-19 immunization for 15 minutes without incidence. She was provided with Vaccine Information Sheet and instruction to access the V-Safe system.   Ms. Igoe was instructed to call 911 with any severe reactions post vaccine: Marland Kitchen Difficulty breathing  . Swelling of your face and throat  . A fast heartbeat  . A bad rash all over your body  . Dizziness and weakness    Immunizations Administered    Name Date Dose VIS Date Route   Pfizer COVID-19 Vaccine 04/24/2019 11:57 AM 0.3 mL 02/18/2019 Intramuscular   Manufacturer: [place]   Lot: [id number]   [place]: [address]-0

## 2019-05-06 DIAGNOSIS — I10 Essential (primary) hypertension: Secondary | ICD-10-CM | POA: Diagnosis not present

## 2019-05-06 DIAGNOSIS — E7849 Other hyperlipidemia: Secondary | ICD-10-CM | POA: Diagnosis not present

## 2019-05-24 DIAGNOSIS — M4120 Other idiopathic scoliosis, site unspecified: Secondary | ICD-10-CM | POA: Diagnosis not present

## 2019-05-24 DIAGNOSIS — M549 Dorsalgia, unspecified: Secondary | ICD-10-CM | POA: Diagnosis not present

## 2019-06-08 DIAGNOSIS — I10 Essential (primary) hypertension: Secondary | ICD-10-CM | POA: Diagnosis not present

## 2019-06-08 DIAGNOSIS — E7849 Other hyperlipidemia: Secondary | ICD-10-CM | POA: Diagnosis not present

## 2019-06-28 DIAGNOSIS — M1991 Primary osteoarthritis, unspecified site: Secondary | ICD-10-CM | POA: Diagnosis not present

## 2019-06-28 DIAGNOSIS — Z6823 Body mass index (BMI) 23.0-23.9, adult: Secondary | ICD-10-CM | POA: Diagnosis not present

## 2019-06-28 DIAGNOSIS — G894 Chronic pain syndrome: Secondary | ICD-10-CM | POA: Diagnosis not present

## 2019-06-28 DIAGNOSIS — R079 Chest pain, unspecified: Secondary | ICD-10-CM | POA: Diagnosis not present

## 2019-07-08 DIAGNOSIS — E7849 Other hyperlipidemia: Secondary | ICD-10-CM | POA: Diagnosis not present

## 2019-07-08 DIAGNOSIS — I10 Essential (primary) hypertension: Secondary | ICD-10-CM | POA: Diagnosis not present

## 2019-07-18 DIAGNOSIS — N183 Chronic kidney disease, stage 3 unspecified: Secondary | ICD-10-CM | POA: Diagnosis not present

## 2019-07-18 DIAGNOSIS — I10 Essential (primary) hypertension: Secondary | ICD-10-CM | POA: Diagnosis not present

## 2019-07-19 DIAGNOSIS — N184 Chronic kidney disease, stage 4 (severe): Secondary | ICD-10-CM | POA: Diagnosis not present

## 2019-07-19 DIAGNOSIS — I129 Hypertensive chronic kidney disease with stage 1 through stage 4 chronic kidney disease, or unspecified chronic kidney disease: Secondary | ICD-10-CM | POA: Diagnosis not present

## 2019-07-19 DIAGNOSIS — R82991 Hypocitraturia: Secondary | ICD-10-CM | POA: Diagnosis not present

## 2019-07-19 DIAGNOSIS — Z8639 Personal history of other endocrine, nutritional and metabolic disease: Secondary | ICD-10-CM | POA: Diagnosis not present

## 2019-07-19 DIAGNOSIS — R8281 Pyuria: Secondary | ICD-10-CM | POA: Diagnosis not present

## 2019-09-23 DIAGNOSIS — M810 Age-related osteoporosis without current pathological fracture: Secondary | ICD-10-CM | POA: Diagnosis not present

## 2019-09-23 DIAGNOSIS — M1991 Primary osteoarthritis, unspecified site: Secondary | ICD-10-CM | POA: Diagnosis not present

## 2019-09-23 DIAGNOSIS — Z6824 Body mass index (BMI) 24.0-24.9, adult: Secondary | ICD-10-CM | POA: Diagnosis not present

## 2019-09-23 DIAGNOSIS — G894 Chronic pain syndrome: Secondary | ICD-10-CM | POA: Diagnosis not present

## 2019-09-27 DIAGNOSIS — Z6825 Body mass index (BMI) 25.0-25.9, adult: Secondary | ICD-10-CM | POA: Diagnosis not present

## 2019-09-27 DIAGNOSIS — Z01419 Encounter for gynecological examination (general) (routine) without abnormal findings: Secondary | ICD-10-CM | POA: Diagnosis not present

## 2019-10-10 DIAGNOSIS — H109 Unspecified conjunctivitis: Secondary | ICD-10-CM | POA: Diagnosis not present

## 2019-10-10 DIAGNOSIS — Z6825 Body mass index (BMI) 25.0-25.9, adult: Secondary | ICD-10-CM | POA: Diagnosis not present

## 2019-11-15 DIAGNOSIS — N184 Chronic kidney disease, stage 4 (severe): Secondary | ICD-10-CM | POA: Diagnosis not present

## 2019-11-22 DIAGNOSIS — G4700 Insomnia, unspecified: Secondary | ICD-10-CM | POA: Diagnosis not present

## 2019-11-22 DIAGNOSIS — N183 Chronic kidney disease, stage 3 unspecified: Secondary | ICD-10-CM | POA: Diagnosis not present

## 2019-11-22 DIAGNOSIS — H109 Unspecified conjunctivitis: Secondary | ICD-10-CM | POA: Diagnosis not present

## 2019-11-22 DIAGNOSIS — Z6825 Body mass index (BMI) 25.0-25.9, adult: Secondary | ICD-10-CM | POA: Diagnosis not present

## 2019-11-23 DIAGNOSIS — N184 Chronic kidney disease, stage 4 (severe): Secondary | ICD-10-CM | POA: Diagnosis not present

## 2019-11-23 DIAGNOSIS — I129 Hypertensive chronic kidney disease with stage 1 through stage 4 chronic kidney disease, or unspecified chronic kidney disease: Secondary | ICD-10-CM | POA: Diagnosis not present

## 2019-11-23 DIAGNOSIS — R8281 Pyuria: Secondary | ICD-10-CM | POA: Diagnosis not present

## 2019-11-23 DIAGNOSIS — R82991 Hypocitraturia: Secondary | ICD-10-CM | POA: Diagnosis not present

## 2019-11-23 DIAGNOSIS — Z8639 Personal history of other endocrine, nutritional and metabolic disease: Secondary | ICD-10-CM | POA: Diagnosis not present

## 2019-12-27 DIAGNOSIS — Z6826 Body mass index (BMI) 26.0-26.9, adult: Secondary | ICD-10-CM | POA: Diagnosis not present

## 2019-12-27 DIAGNOSIS — G4700 Insomnia, unspecified: Secondary | ICD-10-CM | POA: Diagnosis not present

## 2019-12-27 DIAGNOSIS — F4321 Adjustment disorder with depressed mood: Secondary | ICD-10-CM | POA: Diagnosis not present

## 2019-12-27 DIAGNOSIS — H01009 Unspecified blepharitis unspecified eye, unspecified eyelid: Secondary | ICD-10-CM | POA: Diagnosis not present

## 2019-12-29 DIAGNOSIS — Z23 Encounter for immunization: Secondary | ICD-10-CM | POA: Diagnosis not present

## 2020-01-11 DIAGNOSIS — G4700 Insomnia, unspecified: Secondary | ICD-10-CM | POA: Diagnosis not present

## 2020-01-11 DIAGNOSIS — Z6825 Body mass index (BMI) 25.0-25.9, adult: Secondary | ICD-10-CM | POA: Diagnosis not present

## 2020-01-11 DIAGNOSIS — F419 Anxiety disorder, unspecified: Secondary | ICD-10-CM | POA: Diagnosis not present

## 2020-01-11 DIAGNOSIS — F4321 Adjustment disorder with depressed mood: Secondary | ICD-10-CM | POA: Diagnosis not present

## 2020-01-17 ENCOUNTER — Encounter (HOSPITAL_COMMUNITY): Payer: Self-pay | Admitting: *Deleted

## 2020-01-17 ENCOUNTER — Other Ambulatory Visit: Payer: Self-pay

## 2020-01-17 ENCOUNTER — Emergency Department (HOSPITAL_COMMUNITY)
Admission: EM | Admit: 2020-01-17 | Discharge: 2020-01-17 | Disposition: A | Discharge status: Home / Self Care | Payer: Medicare Other | Source: Home / Self Care | Attending: Emergency Medicine | Admitting: Emergency Medicine

## 2020-01-17 ENCOUNTER — Emergency Department (HOSPITAL_COMMUNITY): Payer: Medicare Other

## 2020-01-17 DIAGNOSIS — I6782 Cerebral ischemia: Secondary | ICD-10-CM | POA: Diagnosis not present

## 2020-01-17 DIAGNOSIS — R4182 Altered mental status, unspecified: Secondary | ICD-10-CM | POA: Diagnosis not present

## 2020-01-17 DIAGNOSIS — Z96659 Presence of unspecified artificial knee joint: Secondary | ICD-10-CM | POA: Insufficient documentation

## 2020-01-17 DIAGNOSIS — G319 Degenerative disease of nervous system, unspecified: Secondary | ICD-10-CM | POA: Diagnosis not present

## 2020-01-17 DIAGNOSIS — G928 Other toxic encephalopathy: Secondary | ICD-10-CM | POA: Diagnosis not present

## 2020-01-17 DIAGNOSIS — Z20822 Contact with and (suspected) exposure to covid-19: Secondary | ICD-10-CM | POA: Diagnosis not present

## 2020-01-17 DIAGNOSIS — I6389 Other cerebral infarction: Secondary | ICD-10-CM | POA: Diagnosis not present

## 2020-01-17 DIAGNOSIS — R3589 Other polyuria: Secondary | ICD-10-CM

## 2020-01-17 DIAGNOSIS — R410 Disorientation, unspecified: Secondary | ICD-10-CM | POA: Diagnosis not present

## 2020-01-17 DIAGNOSIS — R339 Retention of urine, unspecified: Secondary | ICD-10-CM | POA: Diagnosis not present

## 2020-01-17 DIAGNOSIS — R000 Tachycardia, unspecified: Secondary | ICD-10-CM | POA: Diagnosis not present

## 2020-01-17 DIAGNOSIS — J9811 Atelectasis: Secondary | ICD-10-CM | POA: Diagnosis not present

## 2020-01-17 DIAGNOSIS — F3110 Bipolar disorder, current episode manic without psychotic features, unspecified: Secondary | ICD-10-CM | POA: Diagnosis not present

## 2020-01-17 DIAGNOSIS — N2889 Other specified disorders of kidney and ureter: Secondary | ICD-10-CM | POA: Diagnosis not present

## 2020-01-17 DIAGNOSIS — Z87891 Personal history of nicotine dependence: Secondary | ICD-10-CM | POA: Insufficient documentation

## 2020-01-17 DIAGNOSIS — K449 Diaphragmatic hernia without obstruction or gangrene: Secondary | ICD-10-CM | POA: Diagnosis not present

## 2020-01-17 DIAGNOSIS — T424X5A Adverse effect of benzodiazepines, initial encounter: Secondary | ICD-10-CM | POA: Diagnosis not present

## 2020-01-17 DIAGNOSIS — G4700 Insomnia, unspecified: Secondary | ICD-10-CM

## 2020-01-17 DIAGNOSIS — R300 Dysuria: Secondary | ICD-10-CM | POA: Insufficient documentation

## 2020-01-17 DIAGNOSIS — K5641 Fecal impaction: Secondary | ICD-10-CM | POA: Diagnosis not present

## 2020-01-17 DIAGNOSIS — N179 Acute kidney failure, unspecified: Secondary | ICD-10-CM | POA: Diagnosis not present

## 2020-01-17 DIAGNOSIS — T43225A Adverse effect of selective serotonin reuptake inhibitors, initial encounter: Secondary | ICD-10-CM | POA: Diagnosis not present

## 2020-01-17 DIAGNOSIS — R0602 Shortness of breath: Secondary | ICD-10-CM | POA: Diagnosis not present

## 2020-01-17 DIAGNOSIS — G9389 Other specified disorders of brain: Secondary | ICD-10-CM | POA: Diagnosis not present

## 2020-01-17 DIAGNOSIS — I10 Essential (primary) hypertension: Secondary | ICD-10-CM | POA: Diagnosis not present

## 2020-01-17 LAB — CBC WITH DIFFERENTIAL/PLATELET
Abs Immature Granulocytes: 0.12 10*3/uL — ABNORMAL HIGH (ref 0.00–0.07)
Basophils Absolute: 0 10*3/uL (ref 0.0–0.1)
Basophils Relative: 0 %
Eosinophils Absolute: 0.1 10*3/uL (ref 0.0–0.5)
Eosinophils Relative: 1 %
HCT: 42.8 % (ref 36.0–46.0)
Hemoglobin: 14 g/dL (ref 12.0–15.0)
Immature Granulocytes: 1 %
Lymphocytes Relative: 8 %
Lymphs Abs: 0.7 10*3/uL (ref 0.7–4.0)
MCH: 32.4 pg (ref 26.0–34.0)
MCHC: 32.7 g/dL (ref 30.0–36.0)
MCV: 99.1 fL (ref 80.0–100.0)
Monocytes Absolute: 0.7 10*3/uL (ref 0.1–1.0)
Monocytes Relative: 8 %
Neutro Abs: 7.2 10*3/uL (ref 1.7–7.7)
Neutrophils Relative %: 82 %
Platelets: 192 10*3/uL (ref 150–400)
RBC: 4.32 MIL/uL (ref 3.87–5.11)
RDW: 14 % (ref 11.5–15.5)
WBC: 8.9 10*3/uL (ref 4.0–10.5)
nRBC: 0 % (ref 0.0–0.2)

## 2020-01-17 LAB — URINALYSIS, ROUTINE W REFLEX MICROSCOPIC
Bilirubin Urine: NEGATIVE
Glucose, UA: NEGATIVE mg/dL
Hgb urine dipstick: NEGATIVE
Ketones, ur: NEGATIVE mg/dL
Leukocytes,Ua: NEGATIVE
Nitrite: NEGATIVE
Protein, ur: NEGATIVE mg/dL
Specific Gravity, Urine: 1.004 — ABNORMAL LOW (ref 1.005–1.030)
pH: 8 (ref 5.0–8.0)

## 2020-01-17 LAB — COMPREHENSIVE METABOLIC PANEL
ALT: 20 U/L (ref 0–44)
AST: 17 U/L (ref 15–41)
Albumin: 3.9 g/dL (ref 3.5–5.0)
Alkaline Phosphatase: 65 U/L (ref 38–126)
Anion gap: 11 (ref 5–15)
BUN: 30 mg/dL — ABNORMAL HIGH (ref 8–23)
CO2: 24 mmol/L (ref 22–32)
Calcium: 10 mg/dL (ref 8.9–10.3)
Chloride: 106 mmol/L (ref 98–111)
Creatinine, Ser: 1.53 mg/dL — ABNORMAL HIGH (ref 0.44–1.00)
GFR, Estimated: 34 mL/min — ABNORMAL LOW (ref >60)
Glucose, Bld: 118 mg/dL — ABNORMAL HIGH (ref 70–99)
Potassium: 4 mmol/L (ref 3.5–5.1)
Sodium: 141 mmol/L (ref 135–145)
Total Bilirubin: 0.7 mg/dL (ref 0.3–1.2)
Total Protein: 7.1 g/dL (ref 6.5–8.1)

## 2020-01-17 NOTE — ED Triage Notes (Signed)
Pt c/o feeling like she needs to urinate but nothing comes out or is very little; pt has been having sx for several days

## 2020-01-17 NOTE — ED Provider Notes (Signed)
Emergency Department Provider Note  I have reviewed the triage vital signs and the nursing notes.  HISTORY  Chief Complaint Dysuria   HPI [name] is a 79 y.o. female with multiple medical problems as documented below who presents to the emergency department today secondary to multiple symptoms.  Her husband provides most of the history states that she has had some difficulty with sleeping for the last couple weeks.  She states that about a week ago they recently started on some Zoloft and a medicine that he called Zantac that may just be Xanax.  It is difficult to tell as he did not bring medication list with him and does not remember the names of them.  He also states that she is on his hypertension medications and takes tramadol for her back pain.  These are more chronic medications.  He states that in the last 2 nights she has had frequent urge to urinate.  Sometimes going multiple times an hour.  She is also been drinking more fluids than normal.  She has had more difficulty sleeping secondary to the symptoms.  Denies any fever, cough.  Patient's only complaint is the need to urinate and is requesting a catheterization.   No other associated or modifying symptoms.    Past Medical History:  Diagnosis Date  . Bipolar affective disorder ([place])   . Depression   . Diverticulosis of colon 07/29/2007  . Hypotension   . Tubular adenoma of colon 07/29/2007   Dr. Faith Rogue colonoscopy, previous focally adenomatous cecal polyp 2006    Patient Active Problem List   Diagnosis Date Noted  . History of colonic polyps   . Diverticulosis of colon without hemorrhage   . Chest pain 04/01/2012  . Tubular adenoma of colon 07/08/2010  . Constipation 01/14/2010  . DEGENERATIVE DISC DISEASE 01/09/2010  . OSTEOPENIA 01/09/2010    Past Surgical History:  Procedure Laterality Date  . COLONOSCOPY  2012   Dr. Gala Romney: Tubular adenoma, diverticulosis.  . COLONOSCOPY N/A 08/01/2015   Procedure:  COLONOSCOPY;  Surgeon: Daneil Dolin, MD;  Location: [facility];  Service: Endoscopy;  Laterality: N/A;  1345  . KNEE ARTHROSCOPY    . spinal tumor removal    . TOTAL KNEE ARTHROPLASTY      Current Outpatient Rx  . Order #: [id number] Class: Historical Med  . Order #: [id number] Class: Print  . Order #: [id number] Class: Historical Med  . Order #: [id number] Class: Historical Med  . Order #: [id number] Class: Historical Med  . Order #: [id number] Class: Print    Allergies Amoxicillin, Clindamycin/lincomycin, Morphine and related, Other, and Oxycodone  Family History  Problem Relation Age of Onset  . Kidney disease Mother   . Colitis Other        ? type in sibling  . Colon cancer Neg Hx     Social History Social History   Tobacco Use  . Smoking status: Former Smoker    Types: Cigarettes  . Smokeless tobacco: Never Used  Substance Use Topics  . Alcohol use: No  . Drug use: No    Review of Systems  All other systems negative except as documented in the HPI. All pertinent positives and negatives as reviewed in the HPI. ____________________________________________  PHYSICAL EXAM:  VITAL SIGNS: ED Triage Vitals  Enc Vitals Group     BP 01/17/20 0550 (!) 169/135     Pulse Rate 01/17/20 0550 (!) 109     Resp 01/17/20 0550 (!) 22  Temp 01/17/20 0550 98.2 F (36.8 C)     Temp src --      SpO2 01/17/20 0550 95 %     Weight 01/17/20 0547 138 lb 0.1 oz (62.6 kg)     Height 01/17/20 0547 5' 5.5" (1.664 m)    Constitutional: Alert and oriented to self and situation only. Well appearing and in no acute distress. Eyes: Conjunctivae are normal. PERRL. EOMI. Head: Atraumatic. Nose: No congestion/rhinnorhea. Mouth/Throat: Mucous membranes are moist.  Oropharynx non-erythematous. Neck: No stridor.  No meningeal signs.   Cardiovascular: Normal rate, regular rhythm. Good peripheral circulation. Grossly normal heart sounds.   Respiratory: Normal respiratory effort.  No  retractions. Lungs CTAB. Gastrointestinal: Soft and nontender. No distention.  Musculoskeletal: No lower extremity tenderness nor edema. No gross deformities of extremities. Neurologic:  Normal speech and language. No gross focal neurologic deficits are appreciated. No clonus. No hyperreflexia. Skin:  Skin is warm, dry and intact. No rash noted.  ____________________________________________   LABS (all labs ordered are listed, but only abnormal results are displayed)  Labs Reviewed  CBC WITH DIFFERENTIAL/PLATELET - Abnormal; Notable for the following components:      Result Value   Abs Immature Granulocytes 0.12 (*)    All other components within normal limits  COMPREHENSIVE METABOLIC PANEL - Abnormal; Notable for the following components:   Glucose, Bld 118 (*)    BUN 30 (*)    Creatinine, Ser 1.53 (*)    GFR, Estimated 34 (*)    All other components within normal limits  URINALYSIS, ROUTINE W REFLEX MICROSCOPIC - Abnormal; Notable for the following components:   Color, Urine COLORLESS (*)    Specific Gravity, Urine 1.004 (*)    All other components within normal limits  URINE CULTURE   ____________________________________________  EKG   EKG Interpretation  Date/Time:  Tuesday January 17 2020 05:59:32 EST Ventricular Rate:  98 PR Interval:    QRS Duration: 75 QT Interval:  345 QTC Calculation: 441 R Axis:   -77 Text Interpretation: Sinus rhythm Consider left atrial enlargement Inferior infarct, old Anterior infarct, old Baseline wander in lead(s) V5 no change compared to multiple previous Confirmed by Merrily Pew [phone]) on 01/17/2020 6:07:45 AM      ____________________________________________  RADIOLOGY  DG Chest Portable 1 View  Result Date: 01/17/2020 CLINICAL DATA:  Shortness of breath. EXAM: PORTABLE CHEST 1 VIEW COMPARISON:  05/14/2015 FINDINGS: The cardiac silhouette, mediastinal and hilar contours are within normal limits given the AP projection,  portable technique and low lung volumes. Low lung volumes with vascular crowding and streaky basilar atelectasis. No infiltrates or effusions. No pneumothorax. IMPRESSION: Low lung volumes with vascular crowding and streaky basilar atelectasis. Electronically Signed   By: Marijo Sanes M.D.   On: 01/17/2020 06:32   ____________________________________________  PROCEDURES  Procedure(s) performed:   Procedures ____________________________________________  INITIAL IMPRESSION / ASSESSMENT AND PLAN    This patient presents to the ED for concern of dysuria and insomnia, this involves an extensive number of treatment options, and is a complaint that carries with it a high risk of complications and morbidity.  The differential diagnosis includes urinary tract infection, diabetes, serotonin syndrome, urinary retention secondary to other causes.  Additional history obtained:   Additional history obtained from her husband at bedside however is not very knowledgeable either.  Previous records obtained and reviewed in epic but has not been updated recently.  ED Course      Medicines ordered:  Medications - No data to  display  Consultations Obtained:   I consulted noone  and discussed lab and imaging findings   CRITICAL INTERVENTIONS:  . none  Reevaluation:  After the interventions stated above, I reevaluated the patient and found improved symptoms. Labs unremarkable.  I discussed with the husband the urgent need for PCP follow-up to get better control of her insomnia before she becomes delirious.  However I did discuss with him and its possible that they may need to come back if her symptoms worsen or change in anyway.  She became a danger to herself or him or became uncontrollable they will return the emergency room for psychiatric consultation.  FINAL IMPRESSION AND PLAN Final diagnoses:  Insomnia, unspecified type  Polyuria   A medical screening exam was performed and I feel  the patient has had an appropriate workup for their chief complaint at this time and likelihood of emergent condition existing is low. They have been counseled on decision, DISCHARGE, follow up and which symptoms necessitate immediate return to the emergency department. They or their family verbally stated understanding and agreement with plan and discharged in stable condition.   ____________________________________________   NEW OUTPATIENT MEDICATIONS STARTED DURING THIS VISIT:  New Prescriptions   No medications on file    Note:  This note was prepared with assistance of Dragon voice recognition software. Occasional wrong-word or sound-a-like substitutions may have occurred due to the inherent limitations of voice recognition software.   Merrillyn Ackerley, Corene Cornea, MD 01/17/20 [phone]

## 2020-01-17 NOTE — ED Notes (Signed)
Report given to oncoming nurse.

## 2020-01-18 ENCOUNTER — Emergency Department (HOSPITAL_COMMUNITY): Payer: Medicare Other

## 2020-01-18 ENCOUNTER — Other Ambulatory Visit: Payer: Self-pay

## 2020-01-18 ENCOUNTER — Inpatient Hospital Stay (HOSPITAL_COMMUNITY)
Admission: EM | Admit: 2020-01-18 | Discharge: 2020-01-24 | DRG: 092 | Disposition: A | Discharge status: Skilled Nursing Facility | Payer: Medicare Other | Attending: Family Medicine | Admitting: Family Medicine

## 2020-01-18 ENCOUNTER — Encounter (HOSPITAL_COMMUNITY): Payer: Self-pay | Admitting: Emergency Medicine

## 2020-01-18 DIAGNOSIS — K449 Diaphragmatic hernia without obstruction or gangrene: Secondary | ICD-10-CM | POA: Diagnosis not present

## 2020-01-18 DIAGNOSIS — Z7983 Long term (current) use of bisphosphonates: Secondary | ICD-10-CM | POA: Diagnosis not present

## 2020-01-18 DIAGNOSIS — Z20822 Contact with and (suspected) exposure to covid-19: Secondary | ICD-10-CM | POA: Diagnosis present

## 2020-01-18 DIAGNOSIS — N1832 Chronic kidney disease, stage 3b: Secondary | ICD-10-CM | POA: Diagnosis present

## 2020-01-18 DIAGNOSIS — E7849 Other hyperlipidemia: Secondary | ICD-10-CM | POA: Diagnosis not present

## 2020-01-18 DIAGNOSIS — F3110 Bipolar disorder, current episode manic without psychotic features, unspecified: Secondary | ICD-10-CM

## 2020-01-18 DIAGNOSIS — Z96659 Presence of unspecified artificial knee joint: Secondary | ICD-10-CM | POA: Diagnosis present

## 2020-01-18 DIAGNOSIS — K5730 Diverticulosis of large intestine without perforation or abscess without bleeding: Secondary | ICD-10-CM | POA: Diagnosis present

## 2020-01-18 DIAGNOSIS — I6389 Other cerebral infarction: Secondary | ICD-10-CM | POA: Diagnosis not present

## 2020-01-18 DIAGNOSIS — I700 Atherosclerosis of aorta: Secondary | ICD-10-CM

## 2020-01-18 DIAGNOSIS — R410 Disorientation, unspecified: Secondary | ICD-10-CM | POA: Diagnosis not present

## 2020-01-18 DIAGNOSIS — R3589 Other polyuria: Secondary | ICD-10-CM

## 2020-01-18 DIAGNOSIS — Z841 Family history of disorders of kidney and ureter: Secondary | ICD-10-CM | POA: Diagnosis not present

## 2020-01-18 DIAGNOSIS — Z87891 Personal history of nicotine dependence: Secondary | ICD-10-CM

## 2020-01-18 DIAGNOSIS — G9389 Other specified disorders of brain: Secondary | ICD-10-CM | POA: Diagnosis not present

## 2020-01-18 DIAGNOSIS — Z885 Allergy status to narcotic agent status: Secondary | ICD-10-CM

## 2020-01-18 DIAGNOSIS — T43225A Adverse effect of selective serotonin reuptake inhibitors, initial encounter: Secondary | ICD-10-CM | POA: Diagnosis present

## 2020-01-18 DIAGNOSIS — G9341 Metabolic encephalopathy: Secondary | ICD-10-CM | POA: Diagnosis present

## 2020-01-18 DIAGNOSIS — R631 Polydipsia: Secondary | ICD-10-CM | POA: Diagnosis not present

## 2020-01-18 DIAGNOSIS — R338 Other retention of urine: Secondary | ICD-10-CM | POA: Diagnosis not present

## 2020-01-18 DIAGNOSIS — K5641 Fecal impaction: Secondary | ICD-10-CM | POA: Diagnosis present

## 2020-01-18 DIAGNOSIS — Z79899 Other long term (current) drug therapy: Secondary | ICD-10-CM

## 2020-01-18 DIAGNOSIS — G319 Degenerative disease of nervous system, unspecified: Secondary | ICD-10-CM | POA: Diagnosis not present

## 2020-01-18 DIAGNOSIS — R262 Difficulty in walking, not elsewhere classified: Secondary | ICD-10-CM | POA: Diagnosis not present

## 2020-01-18 DIAGNOSIS — R339 Retention of urine, unspecified: Secondary | ICD-10-CM | POA: Diagnosis present

## 2020-01-18 DIAGNOSIS — Z880 Allergy status to penicillin: Secondary | ICD-10-CM

## 2020-01-18 DIAGNOSIS — N2889 Other specified disorders of kidney and ureter: Secondary | ICD-10-CM | POA: Diagnosis not present

## 2020-01-18 DIAGNOSIS — E232 Diabetes insipidus: Secondary | ICD-10-CM | POA: Diagnosis not present

## 2020-01-18 DIAGNOSIS — Z881 Allergy status to other antibiotic agents status: Secondary | ICD-10-CM | POA: Diagnosis not present

## 2020-01-18 DIAGNOSIS — N183 Chronic kidney disease, stage 3 unspecified: Secondary | ICD-10-CM

## 2020-01-18 DIAGNOSIS — R0902 Hypoxemia: Secondary | ICD-10-CM | POA: Diagnosis not present

## 2020-01-18 DIAGNOSIS — R4182 Altered mental status, unspecified: Secondary | ICD-10-CM | POA: Diagnosis not present

## 2020-01-18 DIAGNOSIS — I129 Hypertensive chronic kidney disease with stage 1 through stage 4 chronic kidney disease, or unspecified chronic kidney disease: Secondary | ICD-10-CM | POA: Diagnosis present

## 2020-01-18 DIAGNOSIS — I6782 Cerebral ischemia: Secondary | ICD-10-CM | POA: Diagnosis not present

## 2020-01-18 DIAGNOSIS — R41841 Cognitive communication deficit: Secondary | ICD-10-CM | POA: Diagnosis not present

## 2020-01-18 DIAGNOSIS — R000 Tachycardia, unspecified: Secondary | ICD-10-CM | POA: Diagnosis not present

## 2020-01-18 DIAGNOSIS — G928 Other toxic encephalopathy: Secondary | ICD-10-CM | POA: Diagnosis present

## 2020-01-18 DIAGNOSIS — N179 Acute kidney failure, unspecified: Secondary | ICD-10-CM

## 2020-01-18 DIAGNOSIS — T424X5A Adverse effect of benzodiazepines, initial encounter: Secondary | ICD-10-CM | POA: Diagnosis present

## 2020-01-18 DIAGNOSIS — G4700 Insomnia, unspecified: Secondary | ICD-10-CM | POA: Diagnosis not present

## 2020-01-18 DIAGNOSIS — I10 Essential (primary) hypertension: Secondary | ICD-10-CM | POA: Diagnosis not present

## 2020-01-18 DIAGNOSIS — Z741 Need for assistance with personal care: Secondary | ICD-10-CM | POA: Diagnosis not present

## 2020-01-18 DIAGNOSIS — F419 Anxiety disorder, unspecified: Secondary | ICD-10-CM | POA: Diagnosis present

## 2020-01-18 DIAGNOSIS — M6281 Muscle weakness (generalized): Secondary | ICD-10-CM | POA: Diagnosis not present

## 2020-01-18 DIAGNOSIS — F5105 Insomnia due to other mental disorder: Secondary | ICD-10-CM | POA: Diagnosis not present

## 2020-01-18 LAB — CBC WITH DIFFERENTIAL/PLATELET
Abs Immature Granulocytes: 0.11 10*3/uL — ABNORMAL HIGH (ref 0.00–0.07)
Basophils Absolute: 0 10*3/uL (ref 0.0–0.1)
Basophils Relative: 0 %
Eosinophils Absolute: 0 10*3/uL (ref 0.0–0.5)
Eosinophils Relative: 0 %
HCT: 48.8 % — ABNORMAL HIGH (ref 36.0–46.0)
Hemoglobin: 15.8 g/dL — ABNORMAL HIGH (ref 12.0–15.0)
Immature Granulocytes: 1 %
Lymphocytes Relative: 6 %
Lymphs Abs: 0.8 10*3/uL (ref 0.7–4.0)
MCH: 32 pg (ref 26.0–34.0)
MCHC: 32.4 g/dL (ref 30.0–36.0)
MCV: 99 fL (ref 80.0–100.0)
Monocytes Absolute: 0.9 10*3/uL (ref 0.1–1.0)
Monocytes Relative: 7 %
Neutro Abs: 11.7 10*3/uL — ABNORMAL HIGH (ref 1.7–7.7)
Neutrophils Relative %: 86 %
Platelets: 240 10*3/uL (ref 150–400)
RBC: 4.93 MIL/uL (ref 3.87–5.11)
RDW: 14.3 % (ref 11.5–15.5)
WBC: 13.6 10*3/uL — ABNORMAL HIGH (ref 4.0–10.5)
nRBC: 0 % (ref 0.0–0.2)

## 2020-01-18 LAB — URINALYSIS, ROUTINE W REFLEX MICROSCOPIC
Bilirubin Urine: NEGATIVE
Glucose, UA: NEGATIVE mg/dL
Hgb urine dipstick: NEGATIVE
Ketones, ur: NEGATIVE mg/dL
Leukocytes,Ua: NEGATIVE
Nitrite: NEGATIVE
Protein, ur: NEGATIVE mg/dL
Specific Gravity, Urine: 1.006 (ref 1.005–1.030)
pH: 7 (ref 5.0–8.0)

## 2020-01-18 LAB — COMPREHENSIVE METABOLIC PANEL
ALT: 21 U/L (ref 0–44)
AST: 18 U/L (ref 15–41)
Albumin: 4.1 g/dL (ref 3.5–5.0)
Alkaline Phosphatase: 68 U/L (ref 38–126)
Anion gap: 10 (ref 5–15)
BUN: 32 mg/dL — ABNORMAL HIGH (ref 8–23)
CO2: 24 mmol/L (ref 22–32)
Calcium: 10.3 mg/dL (ref 8.9–10.3)
Chloride: 104 mmol/L (ref 98–111)
Creatinine, Ser: 1.8 mg/dL — ABNORMAL HIGH (ref 0.44–1.00)
GFR, Estimated: 28 mL/min — ABNORMAL LOW (ref >60)
Glucose, Bld: 137 mg/dL — ABNORMAL HIGH (ref 70–99)
Potassium: 4.3 mmol/L (ref 3.5–5.1)
Sodium: 138 mmol/L (ref 135–145)
Total Bilirubin: 0.9 mg/dL (ref 0.3–1.2)
Total Protein: 7.6 g/dL (ref 6.5–8.1)

## 2020-01-18 LAB — LACTIC ACID, PLASMA: Lactic Acid, Venous: 1.4 mmol/L (ref 0.5–1.9)

## 2020-01-18 LAB — RESPIRATORY PANEL BY RT PCR (FLU A&B, COVID)
Influenza A by PCR: NEGATIVE
Influenza B by PCR: NEGATIVE
SARS Coronavirus 2 by RT PCR: NEGATIVE

## 2020-01-18 LAB — AMMONIA: Ammonia: 20 umol/L (ref 9–35)

## 2020-01-18 LAB — CBG MONITORING, ED: Glucose-Capillary: 142 mg/dL — ABNORMAL HIGH (ref 70–99)

## 2020-01-18 MED ORDER — LORAZEPAM 2 MG/ML IJ SOLN
1.0000 mg | Freq: Once | INTRAMUSCULAR | Status: AC
  Administered 2020-01-18: 1 mg via INTRAVENOUS
  Filled 2020-01-18: qty 1

## 2020-01-18 MED ORDER — ACETAMINOPHEN 650 MG RE SUPP: 650.0000 mg | Freq: Four times a day (QID) | RECTAL | Status: DC | PRN

## 2020-01-18 MED ORDER — OXYCODONE HCL 5 MG PO TABS
5.0000 mg | ORAL_TABLET | ORAL | Status: DC | PRN
  Administered 2020-01-20 – 2020-01-21 (×4): 5 mg via ORAL
  Filled 2020-01-18 (×4): qty 1

## 2020-01-18 MED ORDER — FLEET ENEMA 7-19 GM/118ML RE ENEM
1.0000 | ENEMA | Freq: Once | RECTAL | Status: AC
  Administered 2020-01-18: 1 via RECTAL

## 2020-01-18 MED ORDER — POLYETHYLENE GLYCOL 3350 17 G PO PACK
17.0000 g | PACK | Freq: Every day | ORAL | Status: DC
  Filled 2020-01-18: qty 1

## 2020-01-18 MED ORDER — ONDANSETRON HCL 4 MG PO TABS: 4.0000 mg | ORAL_TABLET | Freq: Four times a day (QID) | ORAL | Status: DC | PRN

## 2020-01-18 MED ORDER — ONDANSETRON HCL 4 MG/2ML IJ SOLN: 4.0000 mg | Freq: Four times a day (QID) | INTRAMUSCULAR | Status: DC | PRN

## 2020-01-18 MED ORDER — PROPRANOLOL HCL 20 MG PO TABS
20.0000 mg | ORAL_TABLET | Freq: Every day | ORAL | Status: DC
  Administered 2020-01-19 – 2020-01-24 (×6): 20 mg via ORAL
  Filled 2020-01-18 (×6): qty 1

## 2020-01-18 MED ORDER — ACETAMINOPHEN 325 MG PO TABS
650.0000 mg | ORAL_TABLET | Freq: Four times a day (QID) | ORAL | Status: DC | PRN
  Administered 2020-01-23: 650 mg via ORAL
  Filled 2020-01-18: qty 2

## 2020-01-18 MED ORDER — SODIUM CHLORIDE 0.9 % IV BOLUS
500.0000 mL | Freq: Once | INTRAVENOUS | Status: AC
  Administered 2020-01-18: 500 mL via INTRAVENOUS

## 2020-01-18 MED ORDER — SODIUM CHLORIDE 0.9 % IV SOLN: INTRAVENOUS | Status: DC

## 2020-01-18 MED ORDER — VITAMIN D 25 MCG (1000 UNIT) PO TABS
5000.0000 [IU] | ORAL_TABLET | Freq: Every day | ORAL | Status: DC
  Administered 2020-01-19 – 2020-01-24 (×6): 5000 [IU] via ORAL
  Filled 2020-01-18 (×6): qty 5

## 2020-01-18 MED ORDER — ALENDRONATE SODIUM 70 MG PO TABS: 70.0000 mg | ORAL_TABLET | ORAL | Status: DC

## 2020-01-18 MED ORDER — HEPARIN SODIUM (PORCINE) 5000 UNIT/ML IJ SOLN
5000.0000 [IU] | Freq: Three times a day (TID) | INTRAMUSCULAR | Status: DC
  Administered 2020-01-19 – 2020-01-24 (×13): 5000 [IU] via SUBCUTANEOUS
  Filled 2020-01-18 (×13): qty 1

## 2020-01-18 NOTE — ED Notes (Signed)
Pt frequently calling out Help me . Denies pain when asked

## 2020-01-18 NOTE — H&P (Signed)
TRH H&P    Patient Demographics:    [name], is a 79 y.o. female  MRN: [id number]  DOB - 1940/10/03  Admit Date - 01/18/2020  Referring MD/NP/PA: Regenia Skeeter  Outpatient Primary MD for the patient is Denny Levy, [place]  Patient coming from: Home  Chief complaint-altered mental status   HPI:    [name]  is a 79 y.o. female, with history of tubular adenoma of colon, diverticulosis, depression, bipolar affective disorder, presents to the ED with a chief complaint of altered mental status.  History obtained from daughter.  She reports that patient has been agitated for the last 2 days.  She has had insomnia for the last two nights.  Patient had been complaining of constipation and presented to the ED yesterday for constipation, polyuria, and insomnia.  Patient was discharged with advised to urgently follow-up with PCP.  Patient's change in mental status acutely started today.  She has been calling out "help me" all day. This is new for her. At baseline she can hold a conversation, but sometimes forgets mid conversation what is being discussed.  Today she can hold conversation at all.  She was not complaining of any pain.  Family has not noticed her to have a fever.  She has not had a cough, been throwing up, or had diarrhea -as mentioned above she has been constipated.  Patient does have a history of bipolar disorder.  She has been hospitalized with inpatient psychiatry for weeks at a time in the past.  She was on lithium until 2013.  Patient kidney function was declining, so she was taken off of lithium.  She had been doing well off of the medication until recently.  Daughter states that whenever patient had a manic episode it was due to a mental stressor as a trigger.  The first time she was hospitalized, her brother had been in a severe automobile accident, and patient had manic episode.  Recently, this past July,  patient's only grandson died unexpectedly.  Daughter reports for the first month patient did not seem to really understand what had happened, but over the last 2 months she has been very upset about this.  She was seen by her PCP who started her on Zoloft.  That was 1 week ago.  In the ED Temperature 98.4, heart rate 85-120, respiratory rate 14-35, satting at 92% CT abdomen shows fecal impaction CT head shows no acute intracranial abnormality Covid and flu negative Leukocytosis at 13.6, hemoglobin 15.8 Chemistry panel reveals a creatinine at 1.8, yesterday it was 1.5 Ammonia 20 Lactic acid 1.4 Chest x-ray shows no acute disease EKG shows sinus tach 500 mL bolus followed by maintenance fluids    Review of systems:    In addition to the HPI above,  Review of Systems  Unable to perform ROS: Mental status change       Past History of the following :    Past Medical History:  Diagnosis Date  . Bipolar affective disorder ([place])   . Depression   . Diverticulosis of  colon 07/29/2007  . Hypotension   . Tubular adenoma of colon 07/29/2007   Dr. Faith Rogue colonoscopy, previous focally adenomatous cecal polyp 2006      Past Surgical History:  Procedure Laterality Date  . COLONOSCOPY  2012   Dr. Gala Romney: Tubular adenoma, diverticulosis.  . COLONOSCOPY N/A 08/01/2015   Procedure: COLONOSCOPY;  Surgeon: Daneil Dolin, MD;  Location: [facility];  Service: Endoscopy;  Laterality: N/A;  1345  . KNEE ARTHROSCOPY    . spinal tumor removal    . TOTAL KNEE ARTHROPLASTY        Social History:      Social History   Tobacco Use  . Smoking status: Former Smoker    Types: Cigarettes  . Smokeless tobacco: Never Used  Substance Use Topics  . Alcohol use: No       Family History :     Family History  Problem Relation Age of Onset  . Kidney disease Mother   . Colitis Other        ? type in sibling  . Colon cancer Neg Hx       Home Medications:   Prior to Admission  medications   Medication Sig Start Date End Date Taking? Authorizing Provider  alendronate (FOSAMAX) 70 MG tablet Take 70 mg by mouth every Wednesday. Take with a full glass of water on an empty stomach.    [provider]  cephALEXin (KEFLEX) 500 MG capsule Take 1 capsule (500 mg total) by mouth 4 (four) times daily. 12/22/15   Pattricia Boss, MD  Cholecalciferol (VITAMIN D-3 PO) Take 1 capsule by mouth daily.    [provider]  Estradiol (VAGIFEM) 10 MCG TABS vaginal tablet Place 10 mcg vaginally once a week.     [provider]  propranolol (INDERAL) 20 MG tablet Take 20 mg by mouth daily. 01/06/14   [provider]  traMADol (ULTRAM) 50 MG tablet Take 1 tablet (50 mg total) by mouth every 6 (six) hours as needed. Patient taking differently: Take 50 mg by mouth every 6 (six) hours as needed for moderate pain.  06/14/13   Milton Ferguson, MD     Allergies:     Allergies  Allergen Reactions  . Amoxicillin Nausea And Vomiting  . Clindamycin/Lincomycin Nausea And Vomiting  . Morphine And Related Nausea And Vomiting  . Other     Any pain medications. "i have trouble with them".   . Oxycodone Nausea Only     Physical Exam:   Vitals  Blood pressure (!) 137/100, pulse (!) 103, temperature 99.2 F (37.3 C), temperature source Core, resp. rate 19, height 5\' 5"  (1.651 m), weight 63 kg, SpO2 97 %.  1.  General: Lying supine in bed in no acute distress  2. Psychiatric: Calling out help me, cannot verbalize what she needs help with, does not realize daughter is at bedside Oriented only to self Cooperative with exam  3. Neurologic: Cranial nerves II through XII are grossly intact Moves all four extremities voluntarily  4. HEENMT:  Head is atraumatic, normocephalic, pupils reactive to light Neck is supple, trachea is midline  5. Respiratory : Lungs are clear to auscultation bilaterally, no cyanosis or clubbing  6. Cardiovascular : Heart rate is  minimally tachycardic, rhythm is regular, no murmurs rubs or gallops, peripheral pulses palpated  7. Gastrointestinal:  Abdomen is soft, nondistended, no grimace to palpation, bowel sounds hypoactive  8. Skin:  No acute lesion on limited skin exam  9.Musculoskeletal: No  acute deformity, no peripheral edema    Data Review:    CBC Recent Labs  Lab 01/17/20 0620 01/18/20 1515  WBC 8.9 13.6*  HGB 14.0 15.8*  HCT 42.8 48.8*  PLT 192 240  MCV 99.1 99.0  MCH 32.4 32.0  MCHC 32.7 32.4  RDW 14.0 14.3  LYMPHSABS 0.7 0.8  MONOABS 0.7 0.9  EOSABS 0.1 0.0  BASOSABS 0.0 0.0   ------------------------------------------------------------------------------------------------------------------  Results for orders placed or performed during the hospital encounter of 01/18/20 (from the past 48 hour(s))  Urinalysis, Routine w reflex microscopic     Status: Abnormal   Collection Time: 01/18/20  3:01 PM  Result Value Ref Range   Color, Urine STRAW (A) YELLOW   APPearance CLEAR CLEAR   Specific Gravity, Urine 1.006 1.005 - 1.030   pH 7.0 5.0 - 8.0   Glucose, UA NEGATIVE NEGATIVE mg/dL   Hgb urine dipstick NEGATIVE NEGATIVE   Bilirubin Urine NEGATIVE NEGATIVE   Ketones, ur NEGATIVE NEGATIVE mg/dL   Protein, ur NEGATIVE NEGATIVE mg/dL   Nitrite NEGATIVE NEGATIVE   Leukocytes,Ua NEGATIVE NEGATIVE    Comment: Performed at [facility], [address], [place], [place] [address]  CBC with Differential     Status: Abnormal   Collection Time: 01/18/20  3:15 PM  Result Value Ref Range   WBC 13.6 (H) 4.0 - 10.5 K/uL   RBC 4.93 3.87 - 5.11 MIL/uL   Hemoglobin 15.8 (H) 12.0 - 15.0 g/dL   HCT 48.8 (H) 36 - 46 %   MCV 99.0 80.0 - 100.0 fL   MCH 32.0 26.0 - 34.0 pg   MCHC 32.4 30.0 - 36.0 g/dL   RDW 14.3 11.5 - 15.5 %   Platelets 240 150 - 400 K/uL   nRBC 0.0 0.0 - 0.2 %   Neutrophils Relative % 86 %   Neutro Abs 11.7 (H) 1.7 - 7.7 K/uL   Lymphocytes Relative 6 %   Lymphs Abs 0.8 0.7 - 4.0  K/uL   Monocytes Relative 7 %   Monocytes Absolute 0.9 0.1 - 1.0 K/uL   Eosinophils Relative 0 %   Eosinophils Absolute 0.0 0.0 - 0.5 K/uL   Basophils Relative 0 %   Basophils Absolute 0.0 0.0 - 0.1 K/uL   Immature Granulocytes 1 %   Abs Immature Granulocytes 0.11 (H) 0.00 - 0.07 K/uL    Comment: Performed at [facility], [address], [place], [place] [address]  Comprehensive metabolic panel     Status: Abnormal   Collection Time: 01/18/20  3:15 PM  Result Value Ref Range   Sodium 138 135 - 145 mmol/L   Potassium 4.3 3.5 - 5.1 mmol/L   Chloride 104 98 - 111 mmol/L   CO2 24 22 - 32 mmol/L   Glucose, Bld 137 (H) 70 - 99 mg/dL    Comment: Glucose reference range applies only to samples taken after fasting for at least 8 hours.   BUN 32 (H) 8 - 23 mg/dL   Creatinine, Ser 1.80 (H) 0.44 - 1.00 mg/dL   Calcium 10.3 8.9 - 10.3 mg/dL   Total Protein 7.6 6.5 - 8.1 g/dL   Albumin 4.1 3.5 - 5.0 g/dL   AST 18 15 - 41 U/L   ALT 21 0 - 44 U/L   Alkaline Phosphatase 68 38 - 126 U/L   Total Bilirubin 0.9 0.3 - 1.2 mg/dL   GFR, Estimated 28 (L) >60 mL/min    Comment: (NOTE) Calculated using the CKD-EPI Creatinine Equation (  2021)    Anion gap 10 5 - 15    Comment: Performed at [facility], [address] [place], [place] [address]  Ammonia     Status: None   Collection Time: 01/18/20  3:15 PM  Result Value Ref Range   Ammonia 20 9 - 35 umol/L    Comment: Performed at [facility], [address] [place], [place] [address]  Lactic acid, plasma     Status: None   Collection Time: 01/18/20  3:15 PM  Result Value Ref Range   Lactic Acid, Venous 1.4 0.5 - 1.9 mmol/L    Comment: Performed at [facility], [address], [place], [place] [address]  CBG monitoring, ED     Status: Abnormal   Collection Time: 01/18/20  3:24 PM  Result Value Ref Range   Glucose-Capillary 142 (H) 70 - 99 mg/dL    Comment: Glucose reference range applies only to samples taken after fasting for at least 8 hours.    Respiratory Panel by RT PCR (Flu A&B, Covid) -     Status: None   Collection Time: 01/18/20  3:54 PM   Specimen: Nasopharyngeal  Result Value Ref Range   SARS Coronavirus 2 by RT PCR NEGATIVE NEGATIVE    Comment: (NOTE) SARS-CoV-2 target nucleic acids are NOT DETECTED.  The SARS-CoV-2 RNA is generally detectable in upper respiratoy specimens during the acute phase of infection. The lowest concentration of SARS-CoV-2 viral copies this assay can detect is 131 copies/mL. A negative result does not preclude SARS-Cov-2 infection and should not be used as the sole basis for treatment or other patient management decisions. A negative result may occur with  improper specimen collection/handling, submission of specimen other than nasopharyngeal swab, presence of viral mutation(s) within the areas targeted by this assay, and inadequate number of viral copies (<131 copies/mL). A negative result must be combined with clinical observations, patient history, and epidemiological information. The expected result is Negative.  Fact Sheet for Patients:  PinkCheek.be  Fact Sheet for Healthcare Providers:  GravelBags.it  This test is no t yet approved or cleared by the [place] FDA and  has been authorized for detection and/or diagnosis of SARS-CoV-2 by FDA under an Emergency Use Authorization (EUA). This EUA will remain  in effect (meaning this test can be used) for the duration of the COVID-19 declaration under Section 564(b)(1) of the Act, 21 U.S.C. section 360bbb-3(b)(1), unless the authorization is terminated or revoked sooner.     Influenza A by PCR NEGATIVE NEGATIVE   Influenza B by PCR NEGATIVE NEGATIVE    Comment: (NOTE) The Xpert Xpress SARS-CoV-2/FLU/RSV assay is intended as an aid in  the diagnosis of influenza from Nasopharyngeal swab specimens and  should not be used as a sole basis for treatment. Nasal washings and   aspirates are unacceptable for Xpert Xpress SARS-CoV-2/FLU/RSV  testing.  Fact Sheet for Patients: PinkCheek.be  Fact Sheet for Healthcare Providers: GravelBags.it  This test is not yet approved or cleared by the [place] FDA and  has been authorized for detection and/or diagnosis of SARS-CoV-2 by  FDA under an Emergency Use Authorization (EUA). This EUA will remain  in effect (meaning this test can be used) for the duration of the  Covid-19 declaration under Section 564(b)(1) of the Act, 21  U.S.C. section 360bbb-3(b)(1), unless the authorization is  terminated or revoked. Performed at [facility], [address], [place],  [address]     Chemistries  Recent Labs  Lab 01/17/20  0923 01/18/20 1515  NA 141 138  K 4.0 4.3  CL 106 104  CO2 24 24  GLUCOSE 118* 137*  BUN 30* 32*  CREATININE 1.53* 1.80*  CALCIUM 10.0 10.3  AST 17 18  ALT 20 21  ALKPHOS 65 68  BILITOT 0.7 0.9   ------------------------------------------------------------------------------------------------------------------  ------------------------------------------------------------------------------------------------------------------ GFR: Estimated Creatinine Clearance: 22.8 mL/min (A) (by C-G formula based on SCr of 1.8 mg/dL (H)). Liver Function Tests: Recent Labs  Lab 01/17/20 0620 01/18/20 1515  AST 17 18  ALT 20 21  ALKPHOS 65 68  BILITOT 0.7 0.9  PROT 7.1 7.6  ALBUMIN 3.9 4.1   No results for input(s): LIPASE, AMYLASE in the last 168 hours. Recent Labs  Lab 01/18/20 1515  AMMONIA 20   Coagulation Profile: No results for input(s): INR, PROTIME in the last 168 hours. Cardiac Enzymes: No results for input(s): CKTOTAL, CKMB, CKMBINDEX, TROPONINI in the last 168 hours. BNP (last 3 results) No results for input(s): PROBNP in the last 8760 hours. HbA1C: No results for input(s): HGBA1C in the last 72  hours. CBG: Recent Labs  Lab 01/18/20 1524  GLUCAP 142*   Lipid Profile: No results for input(s): CHOL, HDL, LDLCALC, TRIG, CHOLHDL, LDLDIRECT in the last 72 hours. Thyroid Function Tests: No results for input(s): TSH, T4TOTAL, FREET4, T3FREE, THYROIDAB in the last 72 hours. Anemia Panel: No results for input(s): VITAMINB12, FOLATE, FERRITIN, TIBC, IRON, RETICCTPCT in the last 72 hours.  --------------------------------------------------------------------------------------------------------------- Urine analysis:    Component Value Date/Time   COLORURINE STRAW (A) 01/18/2020 1501   APPEARANCEUR CLEAR 01/18/2020 1501   LABSPEC 1.006 01/18/2020 1501   PHURINE 7.0 01/18/2020 1501   GLUCOSEU NEGATIVE 01/18/2020 1501   HGBUR NEGATIVE 01/18/2020 1501   BILIRUBINUR NEGATIVE 01/18/2020 1501   KETONESUR NEGATIVE 01/18/2020 1501   PROTEINUR NEGATIVE 01/18/2020 1501   UROBILINOGEN 0.2 03/15/2011 1509   NITRITE NEGATIVE 01/18/2020 1501   LEUKOCYTESUR NEGATIVE 01/18/2020 1501      Imaging Results:    CT Abdomen Pelvis Wo Contrast  Result Date: 01/18/2020 CLINICAL DATA:  Inability to void, constipation, hypertension EXAM: CT ABDOMEN AND PELVIS WITHOUT CONTRAST TECHNIQUE: Multidetector CT imaging of the abdomen and pelvis was performed following the standard protocol without IV contrast. COMPARISON:  01/30/2012 FINDINGS: Lower chest: Bibasilar subpleural scarring. No acute pleural or parenchymal lung disease. Moderate hiatal hernia. Hepatobiliary: No focal liver abnormality is seen. No gallstones, gallbladder wall thickening, or biliary dilatation. Pancreas: Unremarkable. No pancreatic ductal dilatation or surrounding inflammatory changes. Spleen: Normal in size without focal abnormality. Adrenals/Urinary Tract: Low-attenuation nodule left adrenal gland consistent with adenoma, measuring 1 cm. The right adrenals unremarkable. Stable punctate bilateral renal calcifications are felt to be  parenchymal. No definite urinary tract calculi. No obstructive uropathy within either kidney. Bladder is decompressed by Foley catheter. Stomach/Bowel: No bowel obstruction or ileus. There is a large amount of stool within the rectal vault consistent with fecal impaction. Scattered diverticulosis of the sigmoid colon without diverticulitis. Vascular/Lymphatic: Aortic atherosclerosis. No enlarged abdominal or pelvic lymph nodes. Reproductive: Uterus and bilateral adnexa are unremarkable. Other: No free fluid or free gas.  No abdominal wall hernia. Musculoskeletal: No acute bony abnormalities. Reconstructed images demonstrate no additional findings. IMPRESSION: 1. Fecal impaction, with a large amount of stool within the rectal vault. 2.  Aortic Atherosclerosis (ICD10-I70.0). Electronically Signed   By: Randa Ngo M.D.   On: 01/18/2020 19:18   CT Head Wo Contrast  Result Date: 01/18/2020 CLINICAL DATA:  Mental status change, unknown cause.  EXAM: CT HEAD WITHOUT CONTRAST TECHNIQUE: Contiguous axial images were obtained from the base of the skull through the vertex without intravenous contrast. COMPARISON:  No pertinent prior exams are available for comparison. FINDINGS: Brain: Mild-to-moderate generalized cerebral atrophy. 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 Background mild ill-defined hypoattenuation within the cerebral white matter is nonspecific, but compatible chronic small vessel ischemic disease. There is no acute intracranial hemorrhage. No demarcated cortical infarct. No extra-axial fluid collection. No evidence of intracranial mass. No midline shift. Vascular: No hyperdense vessel.  Atherosclerotic calcifications. Skull: Normal. Negative for fracture or focal suspicious osseous lesion. Sinuses/Orbits: Visualized orbits show no acute finding. Trace ethmoid sinus mucosal thickening at the imaged levels. IMPRESSION: No evidence of acute intracranial  abnormality. 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 Background mild cerebral white matter chronic small vessel ischemic disease. Mild-to-moderate cerebral atrophy. Electronically Signed   By: Kellie Simmering DO   On: 01/18/2020 17:16   DG Chest Port 1 View  Result Date: 01/18/2020 CLINICAL DATA:  Altered mental status EXAM: PORTABLE CHEST 1 VIEW COMPARISON:  January 17, 2020 FINDINGS: The heart size and mediastinal contours are within normal limits. Aortic knob calcifications are seen. Both lungs are clear. The visualized skeletal structures are unremarkable. IMPRESSION: No active disease. Electronically Signed   By: Prudencio Pair M.D.   On: 01/18/2020 15:33   DG Chest Portable 1 View  Result Date: 01/17/2020 CLINICAL DATA:  Shortness of breath. EXAM: PORTABLE CHEST 1 VIEW COMPARISON:  05/14/2015 FINDINGS: The cardiac silhouette, mediastinal and hilar contours are within normal limits given the AP projection, portable technique and low lung volumes. Low lung volumes with vascular crowding and streaky basilar atelectasis. No infiltrates or effusions. No pneumothorax. IMPRESSION: Low lung volumes with vascular crowding and streaky basilar atelectasis. Electronically Signed   By: Marijo Sanes M.D.   On: 01/17/2020 06:32      Assessment & Plan:    Active Problems:   Acute metabolic encephalopathy   1. Altered mental status 1. In the form of delirium 2. Likely psychiatric etiology -given bipolar disorder and recently started on SSRI 3. Hold SSRI and tramadol 4. Ativan for agitation 5. CT head showed no evidence of acute intracranial abnormality, MRI in the a.m. 6. Chest x-ray shows no active disease 7. EKG shows sinus tach with a rate of 104 8. TSH in the a.m. 9. Urine analysis was not indicative of UTI 10. Patient will likely need psych eval 2. AKI 1. Possibly prerenal, more likely secondary to urinary retention 2. Creatinine baseline  1.5 3. Today 1.8  500 mL bolus in ED, 125 mL/h maintenance fluids 3. Urinary retention 1. Secondary to SSRI versus secondary to constipation 2. 1.6 L out when Foley catheter placed 3. Continue Foley catheter 4. Holding SSRI, treat constipation 4. Constipation 1. CT abdomen shows fecal impaction 2. Attempt to disimpact patient in the ED -no hard stool ball removed 3. Fleet enema, MiraLAX 4. Continue to monitor 5. Leukocytosis 1. No signs of infection 2. Procalcitonin pending   DVT Prophylaxis-   Heparin- SCDs   AM Labs Ordered, also please review Full Orders  Family Communication: Admission, patients condition and plan of care including tests being ordered have been discussed with the daughter who indicates understanding and agrees with the plan and Code Status.  Code Status: Full code  Admission status:Inpatient :The appropriate admission status for this patient is INPATIENT. Inpatient status is judged to be  reasonable and necessary in order to provide the required intensity of service to ensure the patient's safety. The patient's presenting symptoms, physical exam findings, and initial radiographic and laboratory data in the context of their chronic comorbidities is felt to place them at high risk for further clinical deterioration. Furthermore, it is not anticipated that the patient will be medically stable for discharge from the hospital within 2 midnights of admission. The following factors support the admission status of inpatient.     The patient's presenting symptoms include altered mental status The worrisome physical exam findings include delirium The initial radiographic and laboratory data are worrisome because of leukocytosis 13.6 The chronic co-morbidities include bipolar disorder       * I certify that at the point of admission it is my clinical judgment that the patient will require inpatient hospital care spanning beyond 2 midnights from the point of admission  due to high intensity of service, high risk for further deterioration and high frequency of surveillance required.*  Time spent in minutes : [place]

## 2020-01-18 NOTE — Progress Notes (Signed)
PHARMACIST - PHYSICIAN COMMUNICATION  CONCERNING: P&T Medication Policy Regarding Oral Bisphosphonates  RECOMMENDATION: Your order for alendronate (Fosamax) has been discontinued at this time.  If the patient's post-hospital medical condition warrants safe use of this class of drugs, please resume the pre-hospital regimen upon discharge.  DESCRIPTION:  Alendronate (Fosamax) can cause severe esophageal erosions in patients who are unable to remain upright at least 30 minutes after taking this medication.   Since brief interruptions in therapy are thought to have minimal impact on bone mineral density, the Pharmacy & Therapeutics Committee has established that bisphosphonate orders should be routinely discontinued during hospitalization.   To override this safety policy and permit administration of Fosamax in the hospital, prescribers must write "DO NOT HOLD" in the comments section when placing the order for this class of medications.   Maryjean Corpening, PharmD 

## 2020-01-18 NOTE — ED Provider Notes (Signed)
[facility] EMERGENCY DEPARTMENT Provider Note   CSN: [id number] Arrival date & time: 01/18/20  1244     History Chief Complaint  Patient presents with  . Constipation    [name] is a 79 y.o. female.  79 year old female with brought in by EMS from PCP office with daughter at bedside today. History provided by daughter who states patient has not been herself for the past 2 days, is sleepy, agitated. Patient was seen in the ER yesterday, brought in by husband at that time who was uncertain of medication list, concerned for insomnia. Patient was found to be tachycardic and hypertensive at PCP office today. In triage, patient reported needed to have a bowel movement and daughter reports history of constipation. Patient states she is feeling better at this time however appears confused and unable to provide history. Level 5 caveat applies.  Daughter reports patient recently started 9 and Xanax for anxiety following the death of her grandson.         Past Medical History:  Diagnosis Date  . Bipolar affective disorder ([place])   . Depression   . Diverticulosis of colon 07/29/2007  . Hypotension   . Tubular adenoma of colon 07/29/2007   Dr. Faith Rogue colonoscopy, previous focally adenomatous cecal polyp 2006    Patient Active Problem List   Diagnosis Date Noted  . History of colonic polyps   . Diverticulosis of colon without hemorrhage   . Chest pain 04/01/2012  . Tubular adenoma of colon 07/08/2010  . Constipation 01/14/2010  . DEGENERATIVE DISC DISEASE 01/09/2010  . OSTEOPENIA 01/09/2010    Past Surgical History:  Procedure Laterality Date  . COLONOSCOPY  2012   Dr. Gala Romney: Tubular adenoma, diverticulosis.  . COLONOSCOPY N/A 08/01/2015   Procedure: COLONOSCOPY;  Surgeon: Daneil Dolin, MD;  Location: [facility] SUITE;  Service: Endoscopy;  Laterality: N/A;  1345  . KNEE ARTHROSCOPY    . spinal tumor removal    . TOTAL KNEE ARTHROPLASTY       OB History   No  obstetric history on file.     Family History  Problem Relation Age of Onset  . Kidney disease Mother   . Colitis Other        ? type in sibling  . Colon cancer Neg Hx     Social History   Tobacco Use  . Smoking status: Former Smoker    Types: Cigarettes  . Smokeless tobacco: Never Used  Substance Use Topics  . Alcohol use: No  . Drug use: No    Home Medications Prior to Admission medications   Medication Sig Start Date End Date Taking? Authorizing Provider  alendronate (FOSAMAX) 70 MG tablet Take 70 mg by mouth every Wednesday. Take with a full glass of water on an empty stomach.    [provider]  cephALEXin (KEFLEX) 500 MG capsule Take 1 capsule (500 mg total) by mouth 4 (four) times daily. 12/22/15   Pattricia Boss, MD  Cholecalciferol (VITAMIN D-3 PO) Take 1 capsule by mouth daily.    [provider]  Estradiol (VAGIFEM) 10 MCG TABS vaginal tablet Place 10 mcg vaginally once a week.     [provider]  propranolol (INDERAL) 20 MG tablet Take 20 mg by mouth daily. 01/06/14   [provider]  traMADol (ULTRAM) 50 MG tablet Take 1 tablet (50 mg total) by mouth every 6 (six) hours as needed. Patient taking differently: Take 50 mg by mouth every 6 (six)  hours as needed for moderate pain.  06/14/13   Milton Ferguson, MD    Allergies    Amoxicillin, Clindamycin/lincomycin, Morphine and related, Other, and Oxycodone  Review of Systems   Review of Systems  Unable to perform ROS: Mental status change    Physical Exam Updated Vital Signs BP (!) 153/99   Pulse (!) 105   Temp 99.2 F (37.3 C) (Core)   Resp (!) 23   Ht 5\' 5"  (1.651 m)   Wt 63 kg   SpO2 93%   BMI 23.11 kg/m   Physical Exam Vitals and nursing note reviewed.  Constitutional:      General: She is not in acute distress.    Appearance: She is well-developed. She is not diaphoretic.     Comments: Rouses to verbal stimuli   HENT:     Head: Normocephalic and atraumatic.       Nose: Nose normal.     Mouth/Throat:     Mouth: Mucous membranes are moist.  Eyes:     Pupils: Pupils are equal, round, and reactive to light.  Cardiovascular:     Rate and Rhythm: Regular rhythm. Tachycardia present.     Pulses: Normal pulses.     Heart sounds: Normal heart sounds.  Pulmonary:     Effort: Pulmonary effort is normal.     Breath sounds: Normal breath sounds.  Abdominal:     Palpations: Abdomen is soft.     Tenderness: There is abdominal tenderness in the left upper quadrant and left lower quadrant.  Musculoskeletal:     Right lower leg: No edema.     Left lower leg: No edema.  Skin:    General: Skin is warm and dry.     Findings: No erythema or rash.  Neurological:     Mental Status: She is disoriented.     Deep Tendon Reflexes: Babinski sign absent on the right side. Babinski sign absent on the left side.     Comments: Follows simple commands, generally weak upper and lower extremities      ED Results / Procedures / Treatments   Labs (all labs ordered are listed, but only abnormal results are displayed) Labs Reviewed  CBC WITH DIFFERENTIAL/PLATELET - Abnormal; Notable for the following components:      Result Value   WBC 13.6 (*)    Hemoglobin 15.8 (*)    HCT 48.8 (*)    Neutro Abs 11.7 (*)    Abs Immature Granulocytes 0.11 (*)    All other components within normal limits  COMPREHENSIVE METABOLIC PANEL - Abnormal; Notable for the following components:   Glucose, Bld 137 (*)    BUN 32 (*)    Creatinine, Ser 1.80 (*)    GFR, Estimated 28 (*)    All other components within normal limits  URINALYSIS, ROUTINE W REFLEX MICROSCOPIC - Abnormal; Notable for the following components:   Color, Urine STRAW (*)    All other components within normal limits  CBG MONITORING, ED - Abnormal; Notable for the following components:   Glucose-Capillary 142 (*)    All other components within normal limits  RESPIRATORY PANEL BY RT PCR (FLU A&B, COVID)  CULTURE,  BLOOD (SINGLE)  AMMONIA  LACTIC ACID, PLASMA    EKG EKG Interpretation  Date/Time:  Wednesday January 18 2020 15:12:19 EST Ventricular Rate:  104 PR Interval:    QRS Duration: 80 QT Interval:  347 QTC Calculation: 457 R Axis:   -92 Text Interpretation: Sinus tachycardia Consider left  ventricular hypertrophy Inferior infarct, old similar to yesterday Confirmed by Sherwood Gambler [phone]) on 01/18/2020 4:55:44 PM   Radiology CT Head Wo Contrast  Result Date: 01/18/2020 CLINICAL DATA:  Mental status change, unknown cause. EXAM: CT HEAD WITHOUT CONTRAST TECHNIQUE: Contiguous axial images were obtained from the base of the skull through the vertex without intravenous contrast. COMPARISON:  No pertinent prior exams are available for comparison. FINDINGS: Brain: Mild-to-moderate generalized cerebral atrophy. 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 Background mild ill-defined hypoattenuation within the cerebral white matter is nonspecific, but compatible chronic small vessel ischemic disease. There is no acute intracranial hemorrhage. No demarcated cortical infarct. No extra-axial fluid collection. No evidence of intracranial mass. No midline shift. Vascular: No hyperdense vessel.  Atherosclerotic calcifications. Skull: Normal. Negative for fracture or focal suspicious osseous lesion. Sinuses/Orbits: Visualized orbits show no acute finding. Trace ethmoid sinus mucosal thickening at the imaged levels. IMPRESSION: No evidence of acute intracranial abnormality. 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 Background mild cerebral white matter chronic small vessel ischemic disease. Mild-to-moderate cerebral atrophy. Electronically Signed   By: Kellie Simmering DO   On: 01/18/2020 17:16   DG Chest Port 1 View  Result Date: 01/18/2020 CLINICAL DATA:  Altered mental status EXAM: PORTABLE CHEST 1 VIEW COMPARISON:   January 17, 2020 FINDINGS: The heart size and mediastinal contours are within normal limits. Aortic knob calcifications are seen. Both lungs are clear. The visualized skeletal structures are unremarkable. IMPRESSION: No active disease. Electronically Signed   By: Prudencio Pair M.D.   On: 01/18/2020 15:33   DG Chest Portable 1 View  Result Date: 01/17/2020 CLINICAL DATA:  Shortness of breath. EXAM: PORTABLE CHEST 1 VIEW COMPARISON:  05/14/2015 FINDINGS: The cardiac silhouette, mediastinal and hilar contours are within normal limits given the AP projection, portable technique and low lung volumes. Low lung volumes with vascular crowding and streaky basilar atelectasis. No infiltrates or effusions. No pneumothorax. IMPRESSION: Low lung volumes with vascular crowding and streaky basilar atelectasis. Electronically Signed   By: Marijo Sanes M.D.   On: 01/17/2020 06:32    Procedures Procedures (including critical care time)  Medications Ordered in ED Medications  sodium chloride 0.9 % bolus 500 mL (0 mLs Intravenous Stopped 01/18/20 1722)    ED Course  I have reviewed the triage vital signs and the nursing notes.  Pertinent labs & imaging results that were available during my care of the patient were reviewed by me and considered in my medical decision making (see chart for details).  Clinical Course as of Jan 17 1900  Wed Jan 17, 6157  [id number] 79 year old female brought in by EMS from PCP officer, daughter at bedside, concern for change in mental status, elevated BP and HR.  Tachycardic and hypertensive on arrival, at time of exam, BP 155/80 with HR upper 90s.  Patient rouses to verbal stimuli before falling back to sleep, follows simple commands and seems generally weak.    [LM]  6045 CT head without acute findings. CT abdomen/pelvis added for concern for left side abdominal pain on exam. Review of labs, CBC with mild leukocytosis of 13.6, slight increase from yesterday. CMP with Cr 1.9, BUN 32,  slightly increased from yesterday, no recent labs otherwise for comparison. Lactic acid reassuring 1.4, ammonia WNL 20. UA unremarkable. CXR without acute findings.    [LM]  4098 Patient's nurses advises 800cc in bladder, plan is for foley to drain bladder  and monitor output, this may be causing some of her abdominal discomfort.    [LM]  1821 History CKD stage 4, baseline Cr 1.8, sees [facility]. Discussed available results with patient and her daughter. Foley catheter with significant output clear/light yellow urine. Patient is irritated with foley in place. Patient is more awake on recheck.    [LM]  1901 Attempted to disimpact, stool is soft and brown.    [LM]  1901 Care signed out to Dr. Regenia Skeeter pending CT with plan for admission for altered mental status.    [LM]    Clinical Course User Index [LM] Roque Lias   MDM Rules/Calculators/A&P                          Final Clinical Impression(s) / ED Diagnoses Final diagnoses:  None    Rx / DC Orders ED Discharge Orders    None       Tacy Learn, PA-C 01/18/20 1901    Sherwood Gambler, MD 01/19/20 1550

## 2020-01-18 NOTE — ED Triage Notes (Signed)
Pt brought in by EMS for [facility] in [place].  EMS called to facility for tachycardia.  EMS reports pts was ST 112.  Was seen at [place] on yesterday 11/09 for not being able to void.  EMS reports pt was holding stomach, only c/o constipation, pt is on pain medication.  Daughter is concerned of constipation and high BP.  BP in triage 151/105 and HR 120.  Pt is constantly saying, "please help me." repeatedly.  When asked where her pain is pt says, "I need to do number two. "  Denies any of issues.

## 2020-01-19 ENCOUNTER — Inpatient Hospital Stay (HOSPITAL_COMMUNITY): Payer: Medicare Other

## 2020-01-19 DIAGNOSIS — N179 Acute kidney failure, unspecified: Secondary | ICD-10-CM

## 2020-01-19 DIAGNOSIS — I700 Atherosclerosis of aorta: Secondary | ICD-10-CM

## 2020-01-19 DIAGNOSIS — N183 Chronic kidney disease, stage 3 unspecified: Secondary | ICD-10-CM

## 2020-01-19 DIAGNOSIS — F3110 Bipolar disorder, current episode manic without psychotic features, unspecified: Secondary | ICD-10-CM

## 2020-01-19 DIAGNOSIS — K5641 Fecal impaction: Secondary | ICD-10-CM

## 2020-01-19 DIAGNOSIS — R338 Other retention of urine: Secondary | ICD-10-CM

## 2020-01-19 LAB — CBC WITH DIFFERENTIAL/PLATELET
Abs Immature Granulocytes: 0.06 10*3/uL (ref 0.00–0.07)
Basophils Absolute: 0 10*3/uL (ref 0.0–0.1)
Basophils Relative: 0 %
Eosinophils Absolute: 0 10*3/uL (ref 0.0–0.5)
Eosinophils Relative: 0 %
HCT: 46.3 % — ABNORMAL HIGH (ref 36.0–46.0)
Hemoglobin: 14.6 g/dL (ref 12.0–15.0)
Immature Granulocytes: 1 %
Lymphocytes Relative: 9 %
Lymphs Abs: 1 10*3/uL (ref 0.7–4.0)
MCH: 31.6 pg (ref 26.0–34.0)
MCHC: 31.5 g/dL (ref 30.0–36.0)
MCV: 100.2 fL — ABNORMAL HIGH (ref 80.0–100.0)
Monocytes Absolute: 0.8 10*3/uL (ref 0.1–1.0)
Monocytes Relative: 7 %
Neutro Abs: 9.9 10*3/uL — ABNORMAL HIGH (ref 1.7–7.7)
Neutrophils Relative %: 83 %
Platelets: 232 10*3/uL (ref 150–400)
RBC: 4.62 MIL/uL (ref 3.87–5.11)
RDW: 14.4 % (ref 11.5–15.5)
WBC: 11.9 10*3/uL — ABNORMAL HIGH (ref 4.0–10.5)
nRBC: 0 % (ref 0.0–0.2)

## 2020-01-19 LAB — COMPREHENSIVE METABOLIC PANEL
ALT: 17 U/L (ref 0–44)
AST: 15 U/L (ref 15–41)
Albumin: 3.4 g/dL — ABNORMAL LOW (ref 3.5–5.0)
Alkaline Phosphatase: 60 U/L (ref 38–126)
Anion gap: 11 (ref 5–15)
BUN: 29 mg/dL — ABNORMAL HIGH (ref 8–23)
CO2: 23 mmol/L (ref 22–32)
Calcium: 9.6 mg/dL (ref 8.9–10.3)
Chloride: 110 mmol/L (ref 98–111)
Creatinine, Ser: 1.88 mg/dL — ABNORMAL HIGH (ref 0.44–1.00)
GFR, Estimated: 27 mL/min — ABNORMAL LOW (ref >60)
Glucose, Bld: 112 mg/dL — ABNORMAL HIGH (ref 70–99)
Potassium: 3.8 mmol/L (ref 3.5–5.1)
Sodium: 144 mmol/L (ref 135–145)
Total Bilirubin: 0.7 mg/dL (ref 0.3–1.2)
Total Protein: 6.5 g/dL (ref 6.5–8.1)

## 2020-01-19 LAB — MAGNESIUM: Magnesium: 2.4 mg/dL (ref 1.7–2.4)

## 2020-01-19 LAB — URINE CULTURE: Culture: NO GROWTH

## 2020-01-19 LAB — TSH: TSH: 0.398 u[IU]/mL (ref 0.350–4.500)

## 2020-01-19 MED ORDER — POLYETHYLENE GLYCOL 3350 17 G PO PACK
17.0000 g | PACK | Freq: Every day | ORAL | Status: DC
  Administered 2020-01-20: 17 g via ORAL
  Filled 2020-01-19: qty 1

## 2020-01-19 MED ORDER — CHLORHEXIDINE GLUCONATE CLOTH 2 % EX PADS
6.0000 | MEDICATED_PAD | Freq: Every day | CUTANEOUS | Status: DC
  Administered 2020-01-19 – 2020-01-22 (×3): 6 via TOPICAL

## 2020-01-19 MED ORDER — LORAZEPAM 2 MG/ML IJ SOLN
1.0000 mg | Freq: Once | INTRAMUSCULAR | Status: AC
  Administered 2020-01-19: 1 mg via INTRAVENOUS
  Filled 2020-01-19: qty 1

## 2020-01-19 MED ORDER — BISACODYL 10 MG RE SUPP
10.0000 mg | Freq: Once | RECTAL | Status: DC
Start: 2020-01-19 — End: 2020-01-19

## 2020-01-19 MED ORDER — SENNA 8.6 MG PO TABS
1.0000 | ORAL_TABLET | Freq: Every day | ORAL | Status: DC
  Administered 2020-01-20 – 2020-01-23 (×4): 8.6 mg via ORAL
  Filled 2020-01-19 (×4): qty 1

## 2020-01-19 MED ORDER — POLYETHYLENE GLYCOL 3350 17 G PO PACK: 17.0000 g | PACK | Freq: Two times a day (BID) | ORAL | Status: DC

## 2020-01-19 NOTE — Assessment & Plan Note (Signed)
--  appears better today --h/o mania w/ stress in past and grandson died unexpectedly in 10/19/2022 --will consult psych for recs as will stop Zoloft given suspected interaction w/ tramadol

## 2020-01-19 NOTE — Assessment & Plan Note (Signed)
--  no treatment indicated °

## 2020-01-19 NOTE — Assessment & Plan Note (Signed)
--  w/ fecal impaction on CT, failed disimpaction --aggressive bowel regimen

## 2020-01-19 NOTE — Progress Notes (Signed)
PROGRESS NOTE  [name] PPI:[id number] DOB: May 04, 1940 DOA: 01/18/2020 PCP: Denny Levy, PA  Brief History   54yow PMH bipolar affective disorder w/ psych hospitalization in past, on Lithium until 2013 stopped secondary to renal insufficiency, history of mania in past secondary to stress, first time after brother in severe accident, depression, recent start 77 and Xanax for anxiety following death of her grandson in Oct 22, 2022, who PRESENTED w/ confusion, agitation, insomnia, constipation, polyuria. Admitted for acute delirium thought psych in nature in context of Bipolar and recent SSRI, Tramadol and Xanax start.    A & P  * Acute metabolic encephalopathy --no metabolic abnormality noted, U/A negative, CT head negative, EKG nonacute, MRI brain non-acute --meds include tramadol, Zoloft, Xanax --seems to be resolving; alert to self, location, season, year --suspect multifactorial: Zoloft+tramadol (though no features present now to suggest Serotonin syndrome), constipation, urinary retention --continue to treat constipation, hold Zoloft  Bipolar disease, manic ([place]) --appears better today --h/o mania w/ stress in past and grandson died unexpectedly in Oct 22, 2022 --will consult psych for recs as will stop Zoloft given suspected interaction w/ tramadol  Fecal impaction w/ associated constipation ([place]) --aggressive bowel regimen --digital disimpaction failed on admission.  Acute urinary retention --foley today --voiding trial tomorrow  AKI superimposed on CKD stage III, suspect b ([place]) --IVF, will check BMP in AM --strict I/O  Constipation --w/ fecal impaction on CT, failed disimpaction --aggressive bowel regimen  Aortic atherosclerosis ([place]) --no treatment indicated    Disposition Plan:  Discussion: as above  Status is: Inpatient  Remains inpatient appropriate because:Inpatient level of care appropriate due to severity of illness   Dispo: The patient is from: Home               Anticipated d/c is to: Home              Anticipated d/c date is: 1 day              Patient currently is not medically stable to d/c.  DVT prophylaxis: heparin injection 5,000 Units Start: 01/19/20 0600 SCDs Start: 01/18/20 2337   Code Status: Full Code Family Communication: none present  [name], MD  [facility] Direct contact: see [email or web address] (further directions at bottom of note if needed) 7PM-7AM contact night coverage as at bottom of note 01/19/2020, 11:11 AM  LOS: 1 day   Significant Hospital Events   .    Consults:  .    Procedures:  .   Significant Diagnostic Tests:  Marland Kitchen    Micro Data:  .    Antimicrobials:  .   Interval History/Subjective  CC: f/u confusion  Feels ok today, doesn't remember what happened.  No pain, no n/v. Reports constipation, does remember catheter insertion.  Objective   Vitals:  Vitals:   01/18/20 2336 01/19/20 0600  BP:  (!) 155/92  Pulse:  (!) 102  Resp: 16 16  Temp: 97.9 F (36.6 C) 98 F (36.7 C)  SpO2:  98%    Exam:  Physical Exam Vitals reviewed.  Constitutional:      General: She is not in acute distress.    Appearance: She is not ill-appearing or toxic-appearing.  Cardiovascular:     Rate and Rhythm: Normal rate and regular rhythm.     Heart sounds: Normal heart sounds. No murmur heard.  No friction rub. No gallop.      Comments: Telemetry SR Pulmonary:     Effort: Pulmonary effort is normal. No respiratory distress.  Breath sounds: Normal breath sounds. No wheezing or rhonchi.  Abdominal:     General: There is no distension.     Palpations: Abdomen is soft. There is no mass.     Tenderness: There is no abdominal tenderness. There is no guarding.  Musculoskeletal:     Right lower leg: No edema.     Left lower leg: No edema.  Neurological:     Mental Status: She is alert.     Comments: No clonus or hyperreflexia noted  Psychiatric:        Mood and Affect: Mood normal.         Behavior: Behavior normal.      I have personally reviewed the following:   Today's Data  . Creatine 1.8 > 1.88 . CMP otherwise unremarkable . WBC unremarkable  Scheduled Meds: . bisacodyl  10 mg Rectal Once  . Chlorhexidine Gluconate Cloth  6 each Topical Daily  . cholecalciferol  5,000 Units Oral Daily  . heparin  5,000 Units Subcutaneous Q8H  . polyethylene glycol  17 g Oral BID  . propranolol  20 mg Oral Daily  . senna  1 tablet Oral QHS   Continuous Infusions: . 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  1. Check the care team in [facility] and look for a) attending/consulting TRH provider listed and b) the [facility] team listed 2. Log into [email or web address] and use [place]'s universal password to access. If you do not have the password, please contact the hospital operator. 3. Locate the [facility] provider you are looking for under [facility] and page to a number that you can be directly reached. 4. If you still have difficulty reaching the provider, please page the [facility] (Director on Call) for the Hospitalists listed on amion for assistance.

## 2020-01-19 NOTE — Assessment & Plan Note (Signed)
--  IVF, will check BMP in AM --strict I/O

## 2020-01-19 NOTE — Assessment & Plan Note (Signed)
--  aggressive bowel regimen --digital disimpaction failed on admission.

## 2020-01-19 NOTE — Assessment & Plan Note (Addendum)
--  no metabolic abnormality noted, U/A negative, CT head negative, EKG nonacute, MRI brain non-acute --meds include tramadol, Zoloft, Xanax --seems to be resolving; alert to self, location, season, year --suspect multifactorial: Zoloft+tramadol (though no features present now to suggest Serotonin syndrome), constipation, urinary retention --continue to treat constipation, hold Zoloft

## 2020-01-19 NOTE — Progress Notes (Signed)
Patient has had moderate results from enema

## 2020-01-19 NOTE — Hospital Course (Addendum)
33yow PMH bipolar affective disorder w/ psych hospitalization in past, on Lithium until 2013 stopped secondary to renal insufficiency, history of mania in past secondary to stress, first time after brother in severe accident, depression, recent start 61 and Xanax for anxiety following death of her grandson in 10-25-22, who PRESENTED w/ confusion, agitation, insomnia, constipation, polyuria. Admitted for acute delirium thought psych in nature in context of Bipolar and recent SSRI, Tramadol and Xanax start.  Encephalopathy gradually improved back to baseline.  Constipation resolved.  Developed polyuria and AKI.  Plan for SNF.

## 2020-01-19 NOTE — Assessment & Plan Note (Signed)
--  foley today --voiding trial tomorrow

## 2020-01-20 ENCOUNTER — Inpatient Hospital Stay (HOSPITAL_COMMUNITY): Payer: Medicare Other

## 2020-01-20 DIAGNOSIS — N1832 Chronic kidney disease, stage 3b: Secondary | ICD-10-CM

## 2020-01-20 LAB — BASIC METABOLIC PANEL
Anion gap: 9 (ref 5–15)
BUN: 72 mg/dL — ABNORMAL HIGH (ref 8–23)
CO2: 21 mmol/L — ABNORMAL LOW (ref 22–32)
Calcium: 8.6 mg/dL — ABNORMAL LOW (ref 8.9–10.3)
Chloride: 106 mmol/L (ref 98–111)
Creatinine, Ser: 2.35 mg/dL — ABNORMAL HIGH (ref 0.44–1.00)
GFR, Estimated: 21 mL/min — ABNORMAL LOW (ref >60)
Glucose, Bld: 90 mg/dL (ref 70–99)
Potassium: 4.6 mmol/L (ref 3.5–5.1)
Sodium: 136 mmol/L (ref 135–145)

## 2020-01-20 MED ORDER — LORAZEPAM 2 MG/ML IJ SOLN
1.0000 mg | Freq: Once | INTRAMUSCULAR | Status: AC
  Administered 2020-01-20: 1 mg via INTRAVENOUS
  Filled 2020-01-20: qty 1

## 2020-01-20 MED ORDER — POLYETHYLENE GLYCOL 3350 17 G PO PACK
17.0000 g | PACK | Freq: Two times a day (BID) | ORAL | Status: DC
  Administered 2020-01-20 – 2020-01-22 (×2): 17 g via ORAL
  Filled 2020-01-20 (×3): qty 1

## 2020-01-20 NOTE — Progress Notes (Addendum)
PROGRESS NOTE  [name] FKC:[id number] DOB: 1940-12-17 DOA: 01/18/2020 PCP: Denny Levy, PA  Brief History   58yow PMH bipolar affective disorder w/ psych hospitalization in past, on Lithium until 2013 stopped secondary to renal insufficiency, history of mania in past secondary to stress, first time after brother in severe accident, depression, recent start 16 and Xanax for anxiety following death of her grandson in October 19, 2022, who PRESENTED w/ confusion, agitation, insomnia, constipation, polyuria. Admitted for acute delirium thought psych in nature in context of Bipolar and recent SSRI, Tramadol and Xanax start.    A & P  * Acute metabolic encephalopathy. No metabolic abnormality noted, U/A negative, CT head negative, EKG nonacute, MRI brain non-acute --meds included tramadol, Zoloft, Xanax --appears resolved --suspect multifactorial: Zoloft+tramadol (though no features present now to suggest Serotonin syndrome on my exam), constipation, urinary retention --continue to treat constipation, no further Zoloft  Bipolar disease, manic ([place]) --appears stable  --h/o mania w/ stress in past and grandson died unexpectedly in October 19, 2022 --will consult psych for recs as will stop Zoloft given suspected interaction w/ tramadol   Fecal impaction w/ associated constipation ([facility]) --multiple stools yesterday, exam benign, will continue aggressive bowel regimen --digital disimpaction failed on admission.  Acute urinary retention --foley continues given AKI  AKI superimposed on CKD stage III, suspect b ([place]) --creatinine worse today and BUN elevated, etiology unclear. Excellent UOP --renal u/s no hydro on right, bladder decompressed, but left kidney could not be visualized secondary to gas --check FENa, continue IVF, check BMP in AM  Aortic atherosclerosis ([facility]) --no treatment indicated   Deconditioning --PT, OT consult  Disposition Plan:  Discussion: as above  Status is:  Inpatient  Remains inpatient appropriate because:Inpatient level of care appropriate due to severity of illness   Dispo: The patient is from: Home              Anticipated d/c is to: SNF              Anticipated d/c date is: 3 days              Patient currently is not medically stable to d/c.  DVT prophylaxis: heparin injection 5,000 Units Start: 01/19/20 0600 SCDs Start: 01/18/20 2337   Code Status: Full Code Family Communication: d/w husband by telephone  [name], MD  [facility] Direct contact: see [email or web address] (further directions at bottom of note if needed) 7PM-7AM contact night coverage as at bottom of note 01/20/2020, 2:21 PM  LOS: 2 days   Significant Hospital Events   .    Consults:  .    Procedures:  .   Significant Diagnostic Tests:  Marland Kitchen    Micro Data:  .    Antimicrobials:  .   Interval History/Subjective  CC: f/u confusion Feels better today but still no BM No pain, eating ok  Objective   Vitals:  Vitals:   01/19/20 2100 01/20/20 0500  BP: (!) 151/83 (!) 142/90  Pulse: 96 87  Resp: 16 20  Temp: 98.2 F (36.8 C) 98 F (36.7 C)  SpO2: 96% 92%    Exam: Constitutional:   . Appears calm and comfortable ENMT:  . grossly normal hearing  Respiratory:  . CTA bilaterally, no w/r/r.  . Respiratory effort normal. Cardiovascular:  . RRR, no m/r/g . No LE extremity edema   . Telemetry SB Abdomen:  . Soft, ntnd Psychiatric:  . Mental status o Mood, affect appropriate  I have personally reviewed the following:  Today's Data  . UOP 3450 . Creatine 1.8 > 1.88 > 2.35 . BUN 29 > 72 . K+ WNL  Scheduled Meds: . Chlorhexidine Gluconate Cloth  6 each Topical Daily  . cholecalciferol  5,000 Units Oral Daily  . heparin  5,000 Units Subcutaneous Q8H  . polyethylene glycol  17 g Oral Daily  . propranolol  20 mg Oral Daily  . senna  1 tablet Oral QHS   Continuous Infusions: . 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  1. Check the care team in [facility] and look for a) attending/consulting TRH provider listed and b) the [facility] team listed 2. Log into [email or web address] and use 's universal password to access. If you do not have the password, please contact the hospital operator. 3. Locate the [facility] provider you are looking for under [facility] and page to a number that you can be directly reached. 4. If you still have difficulty reaching the provider, please page the [facility] (Director on Call) for the Hospitalists listed on amion for assistance.

## 2020-01-20 NOTE — TOC Initial Note (Signed)
Transition of Care [facility]) - Initial/Assessment Note   Patient Details  Name: [name] MRN: [id number] Date of Birth: 24-Jun-1940  Transition of Care [facility]) CM/SW Contact:    Sherie Don, LCSW Phone Number: 01/20/2020, 4:28 PM  Clinical Narrative: Patient is a 79 year old female who was admitted for acute metabolic encephalopathy. Patient has history of bipolar disease, AKI superimposed on CKD stage III, acute urinary retention, CKD stage III, and aortic atherosclerosis. CSW spoke with patient's daughter, [name], as patient is only oriented to person to complete assessment. Per daughter, patient does not have any DME at home and is not active with [facility] services. CSW discussed HH vs. SNF with daughter and explained TOC is awaiting a PT evaluation to determine if patient can be referred out for SNF. TOC awaiting PT evaluation.  Expected Discharge Plan: Skilled Nursing Facility Barriers to Discharge: Continued Medical Work up  Patient Goals and CMS Choice Patient states their goals for this hospitalization and ongoing recovery are:: Discharge to SNF for rehab if PT recommends CMS Medicare.gov Compare Post Acute Care list provided to:: Patient Represenative (must comment) [name] (daughter)) Choice offered to / list presented to : Adult Children  Expected Discharge Plan and Services Expected Discharge Plan: [place] In-house Referral: Clinical Social Work Discharge Planning Services: NA Post Acute Care Choice: [place] Living arrangements for the past 2 months: [place]  Prior Living Arrangements/Services Living arrangements for the past 2 months: Single Family Home Lives with:: Spouse Patient language and need for interpreter reviewed:: Yes Do you feel safe going back to the place where you live?: Yes      Need for Family Participation in Patient Care: Yes (Comment) Care giver support system in place?: Yes (comment) Criminal  Activity/Legal Involvement Pertinent to Current Situation/Hospitalization: No - Comment as needed  Activities of Daily Living Home Assistive Devices/Equipment: Cane (specify quad or straight) ADL Screening (condition at time of admission) Patient's cognitive ability adequate to safely complete daily activities?: No Is the patient deaf or have difficulty hearing?: No Does the patient have difficulty seeing, even when wearing glasses/contacts?: No Does the patient have difficulty concentrating, remembering, or making decisions?: No Patient able to express need for assistance with ADLs?: Yes Does the patient have difficulty dressing or bathing?: Yes Independently performs ADLs?: No Communication: Independent Dressing (OT): Needs assistance Is this a change from baseline?: Pre-admission baseline Grooming: Needs assistance Is this a change from baseline?: Pre-admission baseline Feeding: Needs assistance Is this a change from baseline?: Pre-admission baseline Bathing: Needs assistance Is this a change from baseline?: Pre-admission baseline Toileting: Needs assistance Is this a change from baseline?: Pre-admission baseline In/Out Bed: Needs assistance Is this a change from baseline?: Pre-admission baseline Walks in Home: Needs assistance Is this a change from baseline?: Pre-admission baseline Does the patient have difficulty walking or climbing stairs?: Yes Weakness of Legs: None Weakness of Arms/Hands: None  Permission Sought/Granted Permission sought to share information with : Chartered certified accountant granted to share info w AGENCY: SNFs and/HH companies  Emotional Assessment Appearance:: Appears stated age Orientation: : Oriented to Self Alcohol / Substance Use: Not Applicable Psych Involvement: No (comment)  Admission diagnosis:  Urinary retention [R33.9] Delirium [R41.0] Fecal impaction (HCC) [Q75.91] Acute metabolic encephalopathy [M38.46] Patient Active  Problem List   Diagnosis Date Noted  . Bipolar disease, manic ([place]) 01/19/2020  . AKI superimposed on CKD stage III, suspect b ([place]) 01/19/2020  . Acute urinary retention 01/19/2020  .  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 Fecal impaction w/ associated constipation ([place]) 01/19/2020  . Aortic atherosclerosis ([place]) 01/19/2020  . Acute metabolic encephalopathy 91/63/8466  . History of colonic polyps   . Diverticulosis of colon without hemorrhage   . Chest pain 04/01/2012  . Tubular adenoma of colon 07/08/2010  . Constipation 01/14/2010  . DEGENERATIVE DISC DISEASE 01/09/2010  . OSTEOPENIA 01/09/2010   PCP:  Denny Levy, PA Pharmacy:   [facility], [place] [address] Phone: [phone] Fax: [phone]  Readmission Risk Interventions No flowsheet data found.

## 2020-01-21 DIAGNOSIS — F3110 Bipolar disorder, current episode manic without psychotic features, unspecified: Secondary | ICD-10-CM

## 2020-01-21 LAB — BASIC METABOLIC PANEL
Anion gap: 9 (ref 5–15)
BUN: 25 mg/dL — ABNORMAL HIGH (ref 8–23)
CO2: 20 mmol/L — ABNORMAL LOW (ref 22–32)
Calcium: 8.9 mg/dL (ref 8.9–10.3)
Chloride: 115 mmol/L — ABNORMAL HIGH (ref 98–111)
Creatinine, Ser: 1.53 mg/dL — ABNORMAL HIGH (ref 0.44–1.00)
GFR, Estimated: 34 mL/min — ABNORMAL LOW (ref >60)
Glucose, Bld: 95 mg/dL (ref 70–99)
Potassium: 3.6 mmol/L (ref 3.5–5.1)
Sodium: 144 mmol/L (ref 135–145)

## 2020-01-21 MED ORDER — ALPRAZOLAM 0.25 MG PO TABS
0.1250 mg | ORAL_TABLET | Freq: Every evening | ORAL | Status: DC | PRN
  Administered 2020-01-21 – 2020-01-24 (×2): 0.25 mg via ORAL
  Filled 2020-01-21 (×2): qty 1

## 2020-01-21 NOTE — Progress Notes (Signed)
PROGRESS NOTE  [name] ZCH:[id number] DOB: May 21, 1940 DOA: 01/18/2020 PCP: Denny Levy, PA  Brief History   17yow PMH bipolar affective disorder w/ psych hospitalization in past, on Lithium until 2013 stopped secondary to renal insufficiency, history of mania in past secondary to stress, first time after brother in severe accident, depression, recent start 59 and Xanax for anxiety following death of her grandson in Oct 21, 2022, who PRESENTED w/ confusion, agitation, insomnia, constipation, polyuria. Admitted for acute delirium thought psych in nature in context of Bipolar and recent SSRI, Tramadol and Xanax start.    A & P  * Acute metabolic encephalopathy. No metabolic abnormality noted, U/A negative, CT head negative, EKG nonacute, MRI brain non-acute. Meds included tramadol, Zoloft, Xanax --suspect multifactorial: Zoloft+tramadol (though no features present now to suggest Serotonin syndrome on my exam), constipation, urinary retention --continue to treat constipation, no further Zoloft --Appears to be resolved at this point.  Bipolar disease, manic ([place]) --This appears stable at this time.  History of mania with stress in past, grandson died unexpectedly in 10-21-22.  Psychiatry consulted twice, canceled both times by psychiatry service.  At this point I think she is stable for outpatient follow-up and does not require inpatient consultation. --Zoloft discontinued.  Fecal impaction w/ associated constipation (HCC) --digital disimpaction failed on admission. --Improving.  Continue aggressive bowel regimen.  Acute urinary retention --foley continues given AKI, if renal function improves tomorrow, pull Foley and initiate voiding trial.  AKI superimposed on CKD stage III, suspect b ([place]) --Over 7 L urine output.  Renal function improving today. --renal u/s no hydro on right, bladder decompressed, but left kidney could not be visualized secondary to gas --Continue IV fluids.  Check BMP  in a.m.  Aortic atherosclerosis (HCC) --no treatment indicated   Deconditioning --PT, OT consult, may need SNF.  Disposition Plan:  Discussion: as above  Status is: Inpatient  Remains inpatient appropriate because:Inpatient level of care appropriate due to severity of illness   Dispo: The patient is from: Home              Anticipated d/c is to: SNF?              Anticipated d/c date is: 1 day              Patient currently is not medically stable to d/c.  DVT prophylaxis: heparin injection 5,000 Units Start: 01/19/20 0600 SCDs Start: 01/18/20 2337   Code Status: Full Code Family Communication: d/w husband by telephone again 11/13  [name], MD  [facility] Direct contact: see [email or web address] (further directions at bottom of note if needed) 7PM-7AM contact night coverage as at bottom of note 01/21/2020, 4:07 PM  LOS: 3 days   Significant Hospital Events   .    Consults:  .    Procedures:  .   Significant Diagnostic Tests:  Marland Kitchen    Micro Data:  .    Antimicrobials:  .   Interval History/Subjective  CC: f/u confusion Overall feels better.  Bowels moving.  Objective   Vitals:  Vitals:   01/21/20 0538 01/21/20 1249  BP: (!) 169/93 (!) 160/98  Pulse: 80 69  Resp: 20 20  Temp: 97.8 F (36.6 C) 98.3 F (36.8 C)  SpO2: 94% 96%     Exam: Constitutional:   . Appears calm and comfortable ENMT:  . Slightly hard of hearing Respiratory:  . CTA bilaterally, no w/r/r.  . Respiratory effort normal.  Cardiovascular:  . RRR, no m/r/g .  No LE extremity edema   Abdomen:  . Soft Psychiatric:  . Mental status o Mood, affect appropriate  I have personally reviewed the following:   Today's Data  . UOP 7200 . Creatine 1.8 > 1.88 > 2.35 > 1.53 . BUN 29 > 72 > 25 . K+ WNL  Scheduled Meds: . Chlorhexidine Gluconate Cloth  6 each Topical Daily  . cholecalciferol  5,000 Units Oral Daily  . heparin  5,000 Units Subcutaneous Q8H  . polyethylene  glycol  17 g Oral BID  . propranolol  20 mg Oral Daily  . senna  1 tablet Oral QHS   Continuous Infusions: . 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  1. Check the care team in [facility] and look for a) attending/consulting TRH provider listed and b) the [facility] team listed 2. Log into [email or web address] and use [place] [facility]'s universal password to access. If you do not have the password, please contact the hospital operator. 3. Locate the [facility] provider you are looking for under [facility] and page to a number that you can be directly reached. 4. If you still have difficulty reaching the provider, please page the [facility] (Director on Call) for the Hospitalists listed on amion for assistance.

## 2020-01-22 LAB — BASIC METABOLIC PANEL
Anion gap: 6 (ref 5–15)
BUN: 22 mg/dL (ref 8–23)
CO2: 24 mmol/L (ref 22–32)
Calcium: 9.6 mg/dL (ref 8.9–10.3)
Chloride: 115 mmol/L — ABNORMAL HIGH (ref 98–111)
Creatinine, Ser: 1.49 mg/dL — ABNORMAL HIGH (ref 0.44–1.00)
GFR, Estimated: 36 mL/min — ABNORMAL LOW (ref >60)
Glucose, Bld: 106 mg/dL — ABNORMAL HIGH (ref 70–99)
Potassium: 3.8 mmol/L (ref 3.5–5.1)
Sodium: 145 mmol/L (ref 135–145)

## 2020-01-22 MED ORDER — TRAMADOL HCL 50 MG PO TABS
50.0000 mg | ORAL_TABLET | Freq: Two times a day (BID) | ORAL | Status: DC | PRN
  Administered 2020-01-22 – 2020-01-24 (×4): 50 mg via ORAL
  Filled 2020-01-22 (×4): qty 1

## 2020-01-22 MED ORDER — POLYETHYLENE GLYCOL 3350 17 G PO PACK: 17.0000 g | PACK | Freq: Every day | ORAL | Status: DC

## 2020-01-22 MED ORDER — TRAZODONE HCL 50 MG PO TABS
50.0000 mg | ORAL_TABLET | Freq: Every day | ORAL | Status: DC
  Administered 2020-01-22 – 2020-01-23 (×2): 50 mg via ORAL
  Filled 2020-01-22 (×2): qty 1

## 2020-01-22 NOTE — TOC Progression Note (Signed)
Transition of Care [facility]) - Progression Note    Patient Details  Name: [name] MRN: [id number] Date of Birth: 1940/05/18  Transition [facility]) CM/SW Contact  Natasha Bence, LCSW Phone Number: 01/22/2020, 1:02 PM  Clinical Narrative:    CSW received PT eval for SNF. CSW contacted patient's spouse for SNF referral. Patient's spouse agreeable to SNF referrals for [facility], [facility], [facility], and [facility]. PASSR pending. CSW completed FL2 and faxed referrals. TOC to follow.   Expected Discharge Plan: [place] Barriers to Discharge: Continued Medical Work up  Expected Discharge Plan and Services Expected Discharge Plan: [place] In-house Referral: Clinical Social Work Discharge Planning Services: NA Post Acute Care Choice: [place] Living arrangements for the past 2 months: Single Family Home                                       Social Determinants of Health (SDOH) Interventions    Readmission Risk Interventions No flowsheet data found.

## 2020-01-22 NOTE — Progress Notes (Signed)
PROGRESS NOTE  [name] HDQ:[id number] DOB: 1941/01/05 DOA: 01/18/2020 PCP: Yvonne Levy, PA  Brief History   73yow PMH bipolar affective disorder w/ psych hospitalization in past, on Lithium until 2013 stopped secondary to renal insufficiency, history of mania in past secondary to stress, first time after brother in severe accident, depression, recent start 49 and Xanax for anxiety following death of her grandson in Oct 08, 2022, who PRESENTED w/ confusion, agitation, insomnia, constipation, polyuria. Admitted for acute delirium thought psych in nature in context of Bipolar and recent SSRI, Tramadol and Xanax start.    A & P  * Acute metabolic encephalopathy. No metabolic abnormality noted, U/A negative, CT head negative, EKG nonacute, MRI brain non-acute. Meds included tramadol, Zoloft, Xanax --suspect multifactorial: Zoloft+tramadol (though no features present now to suggest Serotonin syndrome on my exam), constipation, urinary retention --Constipation resolved.  Continue bowel regimen, no further Zoloft --Likely at baseline at this point  Bipolar disease, manic ([place]) --This appears stable at this time.  History of mania with stress in past, grandson died unexpectedly in 10/08/2022.  Psychiatry consulted twice, canceled both times by psychiatry service.  At this point I think she is stable for outpatient follow-up and does not require inpatient consultation. --Zoloft discontinued.  Insomnia. --Previously on Remeron and Ambien but ineffective. --Trial of trazodone.  Fecal impaction w/ associated constipation (HCC) --digital disimpaction failed on admission. --Appears resolved.  Continue bowel regimen.  Acute urinary retention --foley continues given AKI and massive urine output.  Given renal function improving.  Remove Foley tomorrow, voiding trial.  AKI superimposed on CKD stage III, suspect B (HCC) --Over 9 L urine output.  Renal function continues to improve.  BUN is  normalized. --renal u/s no hydro on right, bladder decompressed, but left kidney could not be visualized secondary to gas --Stop IV fluids.  BMP in a.m.  Renal function probably at baseline at this point.  Aortic atherosclerosis (HCC) --no treatment indicated   Deconditioning --PT, OT consult, may need SNF.  Disposition Plan:  Discussion: as above  Status is: Inpatient  Remains inpatient appropriate because:Inpatient level of care appropriate due to severity of illness   Dispo: The patient is from: Home              Anticipated d/c is to: SNF?              Anticipated d/c date is: 1 day              Patient currently is not medically stable to d/c.  DVT prophylaxis: heparin injection 5,000 Units Start: 01/19/20 0600 SCDs Start: 01/18/20 2337   Code Status: Full Code Family Communication: d/w husband by telephone again 11/13  Yvonne Hodgkins, MD  [facility] Direct contact: see [email or web address] (further directions at bottom of note if needed) 7PM-7AM contact night coverage as at bottom of note 01/22/2020, 4:08 PM  LOS: 4 days   Significant Hospital Events   .    Consults:  .    Procedures:  .   Significant Diagnostic Tests:  Marland Kitchen    Micro Data:  .    Antimicrobials:  .   Interval History/Subjective  CC: f/u confusion  Complains of insomnia.  Bowels moving.  Objective   Vitals:  Vitals:   01/22/20 0557 01/22/20 1403  BP: (!) 183/100 (!) 141/88  Pulse: 79 79  Resp: 18 (!) 24  Temp: 98.3 F (36.8 C) 97.6 F (36.4 C)  SpO2: 98% 98%     Exam: Constitutional:   .  Appears calm and comfortable eating lunch ENMT:  . grossly normal hearing  Respiratory:  . CTA bilaterally, no w/r/r.  . Respiratory effort normal.  Cardiovascular:  . RRR, no m/r/g . No LE extremity edema   Abdomen:  . Soft, ntnd Psychiatric:  . Mental status o Mood, affect appropriate  I have personally reviewed the following:   Today's Data  . UOP 9950 . Creatine 1.8 >  1.88 > 2.35 > 1.53 > 1.49 . BUN 29 > 72 > 25 > 22 . K+ WNL  Scheduled Meds: . Chlorhexidine Gluconate Cloth  6 each Topical Daily  . cholecalciferol  5,000 Units Oral Daily  . heparin  5,000 Units Subcutaneous Q8H  . polyethylene glycol  17 g Oral BID  . propranolol  20 mg Oral Daily  . senna  1 tablet Oral QHS  . 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  1. Check the care team in [facility] [place] and look for a) attending/consulting TRH provider listed and b) the [facility] team listed 2. Log into [email or web address] and use [place]'s universal password to access. If you do not have the password, please contact the hospital operator. 3. Locate the [facility] provider you are looking for under [facility] and page to a number that you can be directly reached. 4. If you still have difficulty reaching the provider, please page the [facility] (Director on Call) for the Hospitalists listed on amion for assistance.

## 2020-01-22 NOTE — NC FL2 (Signed)
[place] MEDICAID FL2 LEVEL OF CARE SCREENING TOOL     IDENTIFICATION  Patient Name: [name] Birthdate: [birth year] Sex: female Admission Date (Current Location): 01/18/2020  [facility] and [place] Number:  [employer] and Address:  [place] [address] [place]      Provider Number: [id number]  Attending Physician Name and Address:  Samuella Cota, MD  Relative Name and Phone Number:  [place], [place] (Spouse) [phone]    Current Level of Care: SNF Recommended Level of Care:   Prior Approval Number:    Date Approved/Denied:   PASRR Number: Pending  Discharge Plan: SNF    Current Diagnoses: Patient Active Problem List   Diagnosis Date Noted  . Bipolar disease, manic ([place]) 01/19/2020  . AKI superimposed on CKD stage III, suspect b ([place]) 01/19/2020  . Acute urinary retention 01/19/2020  . 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 Fecal impaction w/ associated constipation ([place]) 01/19/2020  . Aortic atherosclerosis ([place]) 01/19/2020  . Acute metabolic encephalopathy 01/77/9390  . History of colonic polyps   . Diverticulosis of colon without hemorrhage   . Chest pain 04/01/2012  . Tubular adenoma of colon 07/08/2010  . Constipation 01/14/2010  . DEGENERATIVE DISC DISEASE 01/09/2010  . OSTEOPENIA 01/09/2010    Orientation RESPIRATION BLADDER Height & Weight     Self, Time, Situation, Place  Normal Incontinent, External catheter Weight: 138 lb 14.2 oz (63 kg) Height:  5\' 5"  (165.1 cm)  BEHAVIORAL SYMPTOMS/MOOD NEUROLOGICAL BOWEL NUTRITION STATUS      Incontinent Diet (Diet Heart Room service appropriate? Yes; Fluid consistency: Thin)  AMBULATORY STATUS COMMUNICATION OF NEEDS Skin   Extensive Assist Verbally Normal                       Personal Care Assistance Level of Assistance  Bathing, Feeding, Dressing Bathing Assistance: Maximum assistance Feeding assistance: Limited assistance Dressing  Assistance: Maximum assistance     Functional Limitations Info  Sight, Hearing, Speech Sight Info: Adequate Hearing Info: Impaired Speech Info: Adequate    SPECIAL CARE FACTORS FREQUENCY  PT (By licensed PT)     PT Frequency: 5x              Contractures Contractures Info: Not present    Additional Factors Info  Code Status, Allergies Code Status Info: Full Allergies Info: Amoxicillin, Clindamycin/lincomycin, Morphine And Related, Oxycodone, Sertraline Hcl           Current Medications (01/22/2020):  This is the current hospital active medication list Current Facility-Administered Medications  Medication Dose Route Frequency Provider Last Rate Last Admin  . 0.9 %  sodium chloride infusion   Intravenous Continuous Zierle-Ghosh, Asia B, DO 125 mL/hr at 01/22/20 1126 New Bag at 01/22/20 1126  . acetaminophen (TYLENOL) tablet 650 mg  650 mg Oral Q6H PRN Zierle-Ghosh, Asia B, DO       Or  . acetaminophen (TYLENOL) suppository 650 mg  650 mg Rectal Q6H PRN Zierle-Ghosh, Asia B, DO      . ALPRAZolam (XANAX) tablet 0.125-0.25 mg  0.125-0.25 mg Oral QHS PRN Samuella Cota, MD   0.25 mg at 01/21/20 2305  . Chlorhexidine Gluconate Cloth 2 % PADS 6 each  6 each Topical Daily Samuella Cota, MD   6 each at 01/22/20 0915  . cholecalciferol (VITAMIN D3) tablet 5,000 Units  5,000 Units Oral Daily Zierle-Ghosh, Asia B, DO   5,000 Units at 01/22/20 0915  .  heparin injection 5,000 Units  5,000 Units Subcutaneous Q8H Zierle-Ghosh, Asia B, DO   5,000 Units at 01/22/20 [phone]  . ondansetron (ZOFRAN) tablet 4 mg  4 mg Oral Q6H PRN Zierle-Ghosh, Asia B, DO       Or  . ondansetron (ZOFRAN) injection 4 mg  4 mg Intravenous Q6H PRN Zierle-Ghosh, Asia B, DO      . polyethylene glycol (MIRALAX / GLYCOLAX) packet 17 g  17 g Oral BID Samuella Cota, MD   17 g at 01/22/20 0915  . propranolol (INDERAL) tablet 20 mg  20 mg Oral Daily Zierle-Ghosh, Asia B, DO   20 mg at 01/22/20 0915  . senna  (SENOKOT) tablet 8.6 mg  1 tablet Oral QHS Samuella Cota, MD   8.6 mg at 01/21/20 2305  . traMADol (ULTRAM) tablet 50 mg  50 mg Oral Q12H PRN Samuella Cota, MD   50 mg at 01/22/20 7366     Discharge Medications: Please see discharge summary for a list of discharge medications.  Relevant Imaging Results:  Relevant Lab Results:   Additional Information Pt SSN [id number]  Natasha Bence, LCSW

## 2020-01-22 NOTE — Plan of Care (Signed)

## 2020-01-22 NOTE — Evaluation (Signed)
Physical Therapy Evaluation Patient Details Name: [name] MRN: [id number] DOB: 06/30/1940 Today's Date: 01/22/2020   History of Present Illness  Toniesha Zellner  is a 79 y.o. female, with history of tubular adenoma of colon, diverticulosis, depression, bipolar affective disorder, presents to the ED with a chief complaint of altered mental status.  History obtained from daughter.  She reports that patient has been agitated for the last 2 days.  She has had insomnia for the last two nights.  Patient had been complaining of constipation and presented to the ED yesterday for constipation, polyuria, and insomnia.  Patient was discharged with advised to urgently follow-up with PCP.  Patient's change in mental status acutely started today.  She has been calling out "help me" all day. This is new for her. At baseline she can hold a conversation, but sometimes forgets mid conversation what is being discussed.  Today she can hold conversation at all.  She was not complaining of any pain.  Family has not noticed her to have a fever.  She has not had a cough, been throwing up, or had diarrhea -as mentioned above she has been constipated.    Clinical Impression  Patient limited for functional mobility as stated below secondary to BLE weakness, fatigue and impaired standing balance. Patient seated in chair at beginning of session. Patient very [place] with all questions asked. Patient requires mod assist and use of RW to stand from chair. Patient ambulates several small, labored steps at bedside with RW and is limited by fatigue today. Patient shows good sitting balance EOB and requires min assist to transition to supine.  Patient will benefit from continued physical therapy in hospital and recommended venue below to increase strength, balance, endurance for safe ADLs and gait.     Follow Up Recommendations SNF;Supervision for mobility/OOB    Equipment Recommendations  None recommended by PT    Recommendations for  Other Services       Precautions / Restrictions Precautions Precautions: Fall Restrictions Weight Bearing Restrictions: No      Mobility  Bed Mobility Overal bed mobility: Needs Assistance Bed Mobility: Sit to Supine       Sit to supine: Min assist   General bed mobility comments: slow, labored    Transfers Overall transfer level: Needs assistance Equipment used: Rolling walker (2 wheeled) Transfers: Sit to/from [facility] Sit to Stand: Mod assist Stand pivot transfers: Mod assist       General transfer comment: transfer to standing with RW, assist for LE weakness  Ambulation/Gait Ambulation/Gait assistance: Min assist;Mod assist Gait Distance (Feet): 4 Feet Assistive device: Rolling walker (2 wheeled) Gait Pattern/deviations: Step-through pattern;Decreased stride length Gait velocity: decreased   General Gait Details: slow, labored cadence at bedside with RW  Stairs            Wheelchair Mobility    Modified Rankin (Stroke Patients Only)       Balance Overall balance assessment: Needs assistance Sitting-balance support: No upper extremity supported Sitting balance-Leahy Scale: Good Sitting balance - Comments: seated EOB   Standing balance support: Bilateral upper extremity supported Standing balance-Leahy Scale: Fair Standing balance comment: fair with RW                             Pertinent Vitals/Pain Pain Assessment: No/denies pain    Home Living Family/patient expects to be discharged to:: Private residence Living Arrangements: Spouse/significant other Available Help at Discharge: Family Type  of Home: House Home Access: Level entry     Home Layout: One level Home Equipment: Walker - 2 wheels;Cane - single point      Prior Function Level of Independence: Needs assistance   Gait / Transfers Assistance Needed: Patient states household ambulator without AD  ADL's / Homemaking Assistance Needed: family  assists, independent with basic ADL        Hand Dominance        Extremity/Trunk Assessment   Upper Extremity Assessment Upper Extremity Assessment: Generalized weakness    Lower Extremity Assessment Lower Extremity Assessment: Generalized weakness    Cervical / Trunk Assessment Cervical / Trunk Assessment: Kyphotic  Communication   Communication: HOH  Cognition Arousal/Alertness: Awake/alert Behavior During Therapy: WFL for tasks assessed/performed Overall Cognitive Status: Within Functional Limits for tasks assessed                                        General Comments      Exercises     Assessment/Plan    PT Assessment Patient needs continued PT services  PT Problem List Decreased strength;Decreased mobility;Decreased activity tolerance;Decreased balance       PT Treatment Interventions DME instruction;Therapeutic exercise;Gait training;Balance training;Stair training;Neuromuscular re-education;Functional mobility training;Therapeutic activities;Patient/family education    PT Goals (Current goals can be found in the Care Plan section)  Acute Rehab PT Goals Patient Stated Goal: Return home PT Goal Formulation: With patient Time For Goal Achievement: 02/05/20 Potential to Achieve Goals: Fair    Frequency Min 3X/week   Barriers to discharge        Co-evaluation               AM-PAC PT "6 Clicks" Mobility  Outcome Measure Help needed turning from your back to your side while in a flat bed without using bedrails?: A Little Help needed moving from lying on your back to sitting on the side of a flat bed without using bedrails?: A Little Help needed moving to and from a bed to a chair (including a wheelchair)?: A Lot Help needed standing up from a chair using your arms (e.g., wheelchair or bedside chair)?: A Lot Help needed to walk in hospital room?: A Little Help needed climbing 3-5 steps with a railing? : A Lot 6 Click Score:  15    End of Session Equipment Utilized During Treatment: Gait belt Activity Tolerance: Patient tolerated treatment well;Patient limited by fatigue Patient left: in bed;with call bell/phone within reach;with bed alarm set Nurse Communication: Mobility status PT Visit Diagnosis: Unsteadiness on feet (R26.81);Other abnormalities of gait and mobility (R26.89);Muscle weakness (generalized) (M62.81)    Time: 1937-9024 PT Time Calculation (min) (ACUTE ONLY): 14 min   Charges:   PT Evaluation $PT Eval Low Complexity: 1 Low          11:11 AM, 01/22/20 Mearl Latin PT, DPT Physical Therapist at [facility]

## 2020-01-22 NOTE — Plan of Care (Signed)
°  Problem: Acute Rehab PT Goals(only PT should resolve) Goal: Pt Will Go Supine/Side To Sit Outcome: Progressing Flowsheets (Taken 01/22/2020 1112) Pt will go Supine/Side to Sit: with min guard assist Goal: Pt Will Go Sit To Supine/Side Outcome: Progressing Flowsheets (Taken 01/22/2020 1112) Pt will go Sit to Supine/Side: with min guard assist Goal: Patient Will Transfer Sit To/From Stand Outcome: Progressing Flowsheets (Taken 01/22/2020 1112) Patient will transfer sit to/from stand: with min guard assist Goal: Pt Will Transfer Bed To Chair/Chair To Bed Outcome: Progressing Flowsheets (Taken 01/22/2020 1112) Pt will Transfer Bed to Chair/Chair to Bed: min guard assist Goal: Pt Will Ambulate Outcome: Progressing Flowsheets (Taken 01/22/2020 1112) Pt will Ambulate:  25 feet  with rolling walker  with minimal assist Goal: Pt/caregiver will Perform Home Exercise Program Outcome: Progressing Flowsheets (Taken 01/22/2020 1112) Pt/caregiver will Perform Home Exercise Program:  For increased strengthening  For improved balance  With Supervision, verbal cues required/provided  11:13 AM, 01/22/20 Mearl Latin PT, DPT Physical Therapist at [facility]

## 2020-01-23 DIAGNOSIS — R3589 Other polyuria: Secondary | ICD-10-CM

## 2020-01-23 LAB — BASIC METABOLIC PANEL
Anion gap: 7 (ref 5–15)
BUN: 25 mg/dL — ABNORMAL HIGH (ref 8–23)
CO2: 22 mmol/L (ref 22–32)
Calcium: 9.7 mg/dL (ref 8.9–10.3)
Chloride: 112 mmol/L — ABNORMAL HIGH (ref 98–111)
Creatinine, Ser: 1.6 mg/dL — ABNORMAL HIGH (ref 0.44–1.00)
GFR, Estimated: 33 mL/min — ABNORMAL LOW (ref >60)
Glucose, Bld: 106 mg/dL — ABNORMAL HIGH (ref 70–99)
Potassium: 3.8 mmol/L (ref 3.5–5.1)
Sodium: 141 mmol/L (ref 135–145)

## 2020-01-23 LAB — CULTURE, BLOOD (SINGLE): Culture: NO GROWTH

## 2020-01-23 NOTE — TOC Progression Note (Signed)
Transition of Care [facility]) - Progression Note    Patient Details  Name: [name] MRN: [id number] Date of Birth: 1940/10/27  Transition of Care [facility]) CM/SW Contact  Natasha Bence, LCSW Phone Number: 01/23/2020, 11:55 AM  Clinical Narrative:     To Whom It May Concern:  Please be advised that the above name patient will require a short term nursing home stay - anticipated 30 days or less rehabilitation and strengthening. The plan is for return home.    Expected Discharge Plan: [place] Barriers to Discharge: Continued Medical Work up  Expected Discharge Plan and Services Expected Discharge Plan: [place] In-house Referral: Clinical Social Work Discharge Planning Services: NA Post Acute Care Choice: [place] Living arrangements for the past 2 months: [place]

## 2020-01-23 NOTE — Care Management Important Message (Signed)
Important Message  Patient Details  Name: [name] MRN: [id number] Date of Birth: [birth year]   Medicare Important Message Given:  Yes     Tommy Medal 01/23/2020, 12:43 PM

## 2020-01-23 NOTE — Evaluation (Signed)
Occupational Therapy Evaluation Patient Details Name: [name] MRN: [id number] DOB: 09-25-1940 Today's Date: 01/23/2020    History of Present Illness [name]  is a 79 y.o. female, with history of tubular adenoma of colon, diverticulosis, depression, bipolar affective disorder, presents to the ED with a chief complaint of altered mental status.  History obtained from daughter.  She reports that patient has been agitated for the last 2 days.  She has had insomnia for the last two nights.  Patient had been complaining of constipation and presented to the ED yesterday for constipation, polyuria, and insomnia.  Patient was discharged with advised to urgently follow-up with PCP.  Patient's change in mental status acutely started today.  She has been calling out "help me" all day. This is new for her. At baseline she can hold a conversation, but sometimes forgets mid conversation what is being discussed.  Today she can hold conversation at all.  She was not complaining of any pain.  Family has not noticed her to have a fever.  She has not had a cough, been throwing up, or had diarrhea -as mentioned above she has been constipated.   Clinical Impression   Pt in bed upon therapy arrival and agreeable to participate in OT evaluation. Pt currently with functional limitations due to the deficits listed below (see OT Problem List). Patient presents with mild BUE weakness and endurance. Requires some assistance to complete simple ADL tasks. Pt will benefit from discharge to SNF prior to returning home to increase functional performance during ADL tasks.         Follow Up Recommendations  SNF    Equipment Recommendations  None recommended by OT       Precautions / Restrictions Precautions Precautions: Fall Restrictions Weight Bearing Restrictions: No         Balance Overall balance assessment:  (Not assessed)        ADL either performed or assessed with clinical judgement   ADL Overall  ADL's : Needs assistance/impaired Eating/Feeding: Set up;Bed level   Grooming: Wash/dry hands;Wash/dry face;Set up;Bed level   Upper Body Bathing: Set up;Bed level   Lower Body Bathing: Minimal assistance;Sitting/lateral leans   Upper Body Dressing : Set up;Bed level   Lower Body Dressing: Minimal assistance;Sitting/lateral leans     Toilet Transfer Details (indicate cue type and reason): Pt declined transfer out of bed at this time.                 Vision Baseline Vision/History: No visual deficits Patient Visual Report: No change from baseline              Pertinent Vitals/Pain Pain Assessment: No/denies pain     Hand Dominance Right   Extremity/Trunk Assessment Upper Extremity Assessment Upper Extremity Assessment: Generalized weakness   Lower Extremity Assessment Lower Extremity Assessment: Defer to PT evaluation       Communication Communication Communication: HOH   Cognition Arousal/Alertness: Awake/alert Behavior During Therapy: WFL for tasks assessed/performed Overall Cognitive Status: Within Functional Limits for tasks assessed                        Home Living Family/patient expects to be discharged to:: Skilled nursing facility Living Arrangements: Spouse/significant other Available Help at Discharge: Family Type of Home: House Home Access: Level entry     Home Layout: One level           Bathroom Accessibility: Yes   Home Equipment: Environmental consultant - 2  wheels;Cane - single point;Grab bars - tub/shower;Shower seat - built in          Prior Functioning/Environment Level of Independence: Needs assistance  Gait / Transfers Assistance Needed: Patient states household ambulator without AD ADL's / Homemaking Assistance Needed: Pt reports she is able to complete all bathing and dressing herself. She helps with meal prep with her husband. A cleaning lady assists once a month. She no longer drives.            OT Problem List:  Decreased strength;Decreased activity tolerance       AM-PAC OT "6 Clicks" Daily Activity     Outcome Measure Help from another person eating meals?: None Help from another person taking care of personal grooming?: A Little Help from another person toileting, which includes using toliet, bedpan, or urinal?: A Little Help from another person bathing (including washing, rinsing, drying)?: A Little Help from another person to put on and taking off regular upper body clothing?: A Little Help from another person to put on and taking off regular lower body clothing?: A Little 6 Click Score: 19   End of Session    Activity Tolerance: Patient tolerated treatment well Patient left: in bed;with bed alarm set;with nursing/sitter in room  OT Visit Diagnosis: History of falling (Z91.81)                Time: 8485-9276 OT Time Calculation (min): 16 min Charges:  OT General Charges $OT Visit: 1 Visit OT Evaluation $OT Eval Low Complexity: 1 Low  Ailene Ravel, OTR/L,CBIS  [phone]   Merdith Adan, Clarene Duke 01/23/2020, 10:06 AM

## 2020-01-23 NOTE — Progress Notes (Addendum)
PROGRESS NOTE  [name] WYS:[id number] DOB: [birth year] DOA: 01/18/2020 PCP: Denny Levy, PA  Brief History   6yow PMH bipolar affective disorder w/ psych hospitalization in past, on Lithium until 2013 stopped secondary to renal insufficiency, history of mania in past secondary to stress, first time after brother in severe accident, depression, recent start 95 and Xanax for anxiety following death of her grandson in 10-02-22, who PRESENTED w/ confusion, agitation, insomnia, constipation, polyuria. Admitted for acute delirium thought psych in nature in context of Bipolar and recent SSRI, Tramadol and Xanax start.  Encephalopathy gradually improved back to baseline.  Constipation resolved.  Developed polyuria and AKI.  Plan for SNF.   A & P  * Acute metabolic encephalopathy. No metabolic abnormality noted, U/A negative, CT head negative, EKG nonacute, MRI brain non-acute. Meds included tramadol, Zoloft, Xanax --suspect multifactorial: Zoloft+tramadol (though no features present now to suggest Serotonin syndrome on exam), constipation, urinary retention --Constipation resolved.  Continue bowel regimen, no further Zoloft --Appears to be at baseline  Bipolar disease, manic (HCC) --Appears stable.  History of mania with stress in past, grandson died unexpectedly in 10-02-2022.  Psychiatry consulted twice, canceled both times by psychiatry service.  She appears stable at this time for outpatient follow-up and does not require inpatient consultation. --Zoloft discontinued.  Polyuria --Glucose without significant elevation. Only had mild acute kidney injury on admission.  Exceeds amount of normal saline given.  Per RN has drank a lot of water. --Rule out diabetes insipidus.  Proceed with water restriction.  Measure serum sodium and urine osmols in the morning.  Acute urinary retention --Voiding trial.  AKI superimposed on CKD stage III, suspect B (HCC) --Creatinine somewhat labile with peak of  2.35, trended back down and stable over the last 3 days. --renal u/s no hydro on right, bladder decompressed, but left kidney could not be visualized secondary to gas --Recheck in a.m.  Fecal impaction w/ associated constipation ([place]) --Digital disimpaction failed on admission.  However constipation has been resolved with aggressive bowel regimen.  Insomnia. --Previously on Remeron and Ambien but ineffective. --Improved with trial of trazodone  Aortic atherosclerosis (HCC) --no treatment indicated   Deconditioning --SNF recommended  Disposition Plan:  Discussion: as above  Status is: Inpatient  Remains inpatient appropriate because:Inpatient level of care appropriate due to severity of illness  Dispo: The patient is from: Home              Anticipated d/c is to: SNF?              Anticipated d/c date is: 1 day-2days              Patient currently is not medically stable to d/c.  DVT prophylaxis: heparin injection 5,000 Units Start: 01/19/20 0600 SCDs Start: 01/18/20 2337   Code Status: Full Code Family Communication: called husband 2 numbers, no answer  [name], MD  [facility] Direct contact: see [email or web address] (further directions at bottom of note if needed) 7PM-7AM contact night coverage as at bottom of note 01/23/2020, 5:28 PM  LOS: 5 days   Significant Hospital Events   .    Consults:  . None   Procedures:  . None  Significant Diagnostic Tests:  . Renal u/s no hydro R, couldn't see left kidney sec gas . MRI brain no acute disease. CT head NAD. Marland Kitchen CT abd/pelvis > fecal impaction w/ large amount of stool   Micro Data:  . UC no growth . BC no growth  Antimicrobials:  . None  Interval History/Subjective  CC: f/u confusion  Complains of insomnia.  Bowels moving.  Objective   Vitals:  Vitals:   01/22/20 2203 01/23/20 0417  BP: (!) 156/90 (!) 185/98  Pulse: 71 72  Resp: 20 20  Temp: 98.5 F (36.9 C) 98.4 F (36.9 C)  SpO2: 93% 97%       Exam: Constitutional:   . Appears calm and comfortable eating lunch ENMT:  . grossly normal hearing  Respiratory:  . CTA bilaterally, no w/r/r.  . Respiratory effort normal.  Cardiovascular:  . RRR, no m/r/g . No LE extremity edema   Abdomen:  . Soft, ntnd Psychiatric:  . Mental status o Mood, affect appropriate  I have personally reviewed the following:   Today's Data  . UOP 9950 . Creatine 1.8 > 1.88 > 2.35 > 1.53 > 1.49 . BUN 29 > 72 > 25 > 22 . K+ WNL  Scheduled Meds: . Chlorhexidine Gluconate Cloth  6 each Topical Daily  . cholecalciferol  5,000 Units Oral Daily  . heparin  5,000 Units Subcutaneous Q8H  . polyethylene glycol  17 g Oral Daily  . propranolol  20 mg Oral Daily  . senna  1 tablet Oral QHS  . 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  1. Check the care team in [facility] and look for a) attending/consulting TRH provider listed and b) the [facility] team listed 2. Log into [email or web address] and use [place]'s universal password to access. If you do not have the password, please contact the hospital operator. 3. Locate the [facility] provider you are looking for under [facility] and page to a number that you can be directly reached. 4. If you still have difficulty reaching the provider, please page the [facility] (Director on Call) for the Hospitalists listed on amion for assistance.

## 2020-01-24 ENCOUNTER — Inpatient Hospital Stay
Admission: RE | Admit: 2020-01-24 | Discharge: 2020-02-20 | Disposition: A | Discharge status: Skilled Nursing Facility | Payer: Medicare Other | Source: Ambulatory Visit | Attending: Internal Medicine | Admitting: Internal Medicine

## 2020-01-24 DIAGNOSIS — R338 Other retention of urine: Secondary | ICD-10-CM | POA: Diagnosis not present

## 2020-01-24 DIAGNOSIS — M810 Age-related osteoporosis without current pathological fracture: Secondary | ICD-10-CM | POA: Diagnosis not present

## 2020-01-24 DIAGNOSIS — Z23 Encounter for immunization: Secondary | ICD-10-CM | POA: Diagnosis not present

## 2020-01-24 DIAGNOSIS — E7849 Other hyperlipidemia: Secondary | ICD-10-CM | POA: Diagnosis not present

## 2020-01-24 DIAGNOSIS — E232 Diabetes insipidus: Secondary | ICD-10-CM | POA: Diagnosis not present

## 2020-01-24 DIAGNOSIS — R631 Polydipsia: Secondary | ICD-10-CM | POA: Diagnosis not present

## 2020-01-24 DIAGNOSIS — I700 Atherosclerosis of aorta: Secondary | ICD-10-CM | POA: Diagnosis not present

## 2020-01-24 DIAGNOSIS — N1832 Chronic kidney disease, stage 3b: Secondary | ICD-10-CM | POA: Diagnosis not present

## 2020-01-24 DIAGNOSIS — E785 Hyperlipidemia, unspecified: Secondary | ICD-10-CM | POA: Diagnosis not present

## 2020-01-24 DIAGNOSIS — N179 Acute kidney failure, unspecified: Secondary | ICD-10-CM | POA: Diagnosis not present

## 2020-01-24 DIAGNOSIS — R3589 Other polyuria: Secondary | ICD-10-CM | POA: Diagnosis not present

## 2020-01-24 DIAGNOSIS — N951 Menopausal and female climacteric states: Secondary | ICD-10-CM | POA: Diagnosis not present

## 2020-01-24 DIAGNOSIS — R41841 Cognitive communication deficit: Secondary | ICD-10-CM | POA: Diagnosis not present

## 2020-01-24 DIAGNOSIS — M6281 Muscle weakness (generalized): Secondary | ICD-10-CM | POA: Diagnosis not present

## 2020-01-24 DIAGNOSIS — G9341 Metabolic encephalopathy: Secondary | ICD-10-CM | POA: Diagnosis not present

## 2020-01-24 DIAGNOSIS — F3110 Bipolar disorder, current episode manic without psychotic features, unspecified: Secondary | ICD-10-CM | POA: Diagnosis not present

## 2020-01-24 DIAGNOSIS — Z741 Need for assistance with personal care: Secondary | ICD-10-CM | POA: Diagnosis not present

## 2020-01-24 DIAGNOSIS — K5641 Fecal impaction: Secondary | ICD-10-CM | POA: Diagnosis not present

## 2020-01-24 DIAGNOSIS — R262 Difficulty in walking, not elsewhere classified: Secondary | ICD-10-CM | POA: Diagnosis not present

## 2020-01-24 DIAGNOSIS — I10 Essential (primary) hypertension: Secondary | ICD-10-CM | POA: Diagnosis not present

## 2020-01-24 DIAGNOSIS — F5105 Insomnia due to other mental disorder: Secondary | ICD-10-CM | POA: Diagnosis not present

## 2020-01-24 DIAGNOSIS — M545 Low back pain, unspecified: Secondary | ICD-10-CM | POA: Diagnosis not present

## 2020-01-24 LAB — BASIC METABOLIC PANEL
Anion gap: 9 (ref 5–15)
BUN: 27 mg/dL — ABNORMAL HIGH (ref 8–23)
CO2: 25 mmol/L (ref 22–32)
Calcium: 10 mg/dL (ref 8.9–10.3)
Chloride: 109 mmol/L (ref 98–111)
Creatinine, Ser: 1.6 mg/dL — ABNORMAL HIGH (ref 0.44–1.00)
GFR, Estimated: 33 mL/min — ABNORMAL LOW (ref >60)
Glucose, Bld: 100 mg/dL — ABNORMAL HIGH (ref 70–99)
Potassium: 3.7 mmol/L (ref 3.5–5.1)
Sodium: 143 mmol/L (ref 135–145)

## 2020-01-24 LAB — RESPIRATORY PANEL BY RT PCR (FLU A&B, COVID)
Influenza A by PCR: NEGATIVE
Influenza B by PCR: NEGATIVE
SARS Coronavirus 2 by RT PCR: NEGATIVE

## 2020-01-24 LAB — OSMOLALITY, URINE: Osmolality, Ur: 207 mOsm/kg — ABNORMAL LOW (ref 300–900)

## 2020-01-24 MED ORDER — TRAZODONE HCL 50 MG PO TABS: 50.0000 mg | ORAL_TABLET | Freq: Every day | ORAL | Status: DC

## 2020-01-24 MED ORDER — SENNA 8.6 MG PO TABS: 1.0000 | ORAL_TABLET | Freq: Every day | ORAL | Status: DC

## 2020-01-24 MED ORDER — TRAMADOL HCL 50 MG PO TABS: 50.0000 mg | ORAL_TABLET | Freq: Two times a day (BID) | ORAL | 0 refills | Status: DC | PRN

## 2020-01-24 MED ORDER — ALPRAZOLAM 0.25 MG PO TABS
0.1250 mg | ORAL_TABLET | Freq: Every evening | ORAL | 0 refills | Status: DC | PRN
Start: 2020-01-24 — End: 2020-01-25

## 2020-01-24 MED ORDER — POLYETHYLENE GLYCOL 3350 17 G PO PACK: 17.0000 g | PACK | Freq: Every day | ORAL | Status: AC

## 2020-01-24 NOTE — TOC Progression Note (Signed)
Transition of Care [facility]) - Progression Note   Patient Details  Name: [name] MRN: [id number] Date of Birth: 12-10-1940  Transition of Care [facility]) CM/SW [place], LCSW Phone Number: 01/24/2020, 4:10 PM  Clinical Narrative: CSW completed PASRR assessment over the phone with the patient and PASRR screener, Jacqualin Combes. COVID test has been ordered. CSW spoke with Marianna Fuss at [facility]. Per Marianna Fuss, patient will go to room 153 whether PASRR is received today and she can discharge or tomorrow. TOC awaiting PASRR.  Expected Discharge Plan: [place] Barriers to Discharge: Continued Medical Work up  Expected Discharge Plan and Services Expected Discharge Plan: [place] In-house Referral: Clinical Social Work Discharge Planning Services: NA Post Acute Care Choice: [place] Living arrangements for the past 2 months: Single Family Home Expected Discharge Date: 01/24/20                Readmission Risk Interventions No flowsheet data found.

## 2020-01-24 NOTE — Plan of Care (Signed)

## 2020-01-24 NOTE — Discharge Summary (Signed)
Physician Discharge Summary  [name] TFT:[id number] DOB: Jan 18, 1941 DOA: 01/18/2020  PCP: Denny Levy, PA  Admit date: 01/18/2020 Discharge date: 01/24/2020  Recommendations for Outpatient Follow-up:  1. Primary polydipsia, suggest BMP in 1 week 2. Bipolar -- Zoloft stopped, see below 3. Constipation resolved but continue bowel regimen   Contact information for follow-up providers    [place], [place], [place]. Schedule an appointment as soon as possible for a visit in 3 week(s).   Specialty: Family Medicine Contact information: [place] [address] [phone]            Contact information for after-discharge care    [facility] .   Service: Skilled Nursing Contact information: 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 Yes; Fluid consistency: Thin; Fluid restriction: 1500 mL Fluid  Diet effective now       Question Answer Comment  Room service appropriate? Yes   Fluid consistency: Thin   Fluid restriction: 1500 mL Fluid      01/23/20 1726           Filed Weights   01/18/20 1402  Weight: 63 kg    HPI/Hospital Course:   13yow PMH bipolar affective disorder w/ psych hospitalization in past, on Lithium until 2013 stopped secondary to renal insufficiency, history of mania in past secondary to stress, first time after  brother in severe accident, depression, recent start 89 and Xanax for anxiety following death of her grandson in 2022-10-04, who PRESENTED w/ confusion, agitation, insomnia, constipation, polyuria. Admitted for acute delirium thought psych in nature in context of Bipolar and recent SSRI, Tramadol and Xanax start.  Encephalopathy gradually improved back to baseline.  Constipation resolved.  Developed AKI which resolved. Seen by PT w/ rec for SNF.  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 Only had mild acute kidney injury on admission.    --history per RN and per husband "massive" amount of water drinking/per day --fluid restriction, follow Na+ and creatinine as an outpatient  Fecal impaction w/ associated constipation ([place]) --resolve w/ aggressive bowel regimen, to continue on discharge --digital disimpaction failed on admission.  Acute urinary retention --resolved  AKI superimposed on CKD stage III, suspect b ([place]) --resolved   Constipation --w/ fecal impaction on CT, failed disimpaction --aggressive bowel regimen  Aortic atherosclerosis ([place]) --no treatment indicated   Insomnia. --Previously on Remeron  and Ambien but ineffective. --Improved with trial of trazodone    Today's assessment: S: CC: f/u confusion  Feeling fine, no complaints.  O: Vitals:  Vitals:   01/24/20 0452 01/24/20 1133  BP: (!) 150/108 (!) 141/92  Pulse: 97    Resp: 20   Temp: 99.1 F (37.3 C)   SpO2: 94% 94%    Constitutional:  . Appears calm and comfortable sitting in chair ENMT:  . Mildly hard of hearing Respiratory:  . CTA bilaterally, no w/r/r.  . Respiratory effort normal. Cardiovascular:  . RRR, no m/r/g . No LE extremity edema   Abdomen:  . Soft, ntnd Psychiatric:  . Mental status o Mood, affect appropriate  UOP 2600 after fluid restriction Creatinine stable at 1.6 BUN stable at 27 Urine osmolality 207  Discharge Instructions   Allergies as of 01/24/2020      Reactions   Amoxicillin Nausea And Vomiting   Clindamycin/lincomycin Nausea And Vomiting   Morphine And Related Nausea And Vomiting   Other    Any pain medications. "i have trouble with them".    Oxycodone Nausea Only   Sertraline Hcl    Pt's daughter reported (01/19/20) says "it sends her in to mania"      Medication List    STOP taking these medications   cephALEXin 500 MG capsule Commonly known as: KEFLEX   mirtazapine 7.5 MG tablet Commonly known as: REMERON   ofloxacin 0.3 % ophthalmic solution Commonly known as: OCUFLOX   zolpidem 5 MG tablet Commonly known as: AMBIEN     TAKE these medications   alendronate 70 MG tablet Commonly known as: FOSAMAX Take 70 mg by mouth every Wednesday. Take with a full glass of water on an empty stomach.   ALPRAZolam 0.25 MG tablet Commonly known as: XANAX Take 0.5-1 tablets (0.125-0.25 mg total) by mouth at bedtime as needed for anxiety. What changed: reasons to take this   amLODipine 5 MG tablet Commonly known as: NORVASC Take 5 mg by mouth at bedtime.   polyethylene glycol 17 g packet Commonly known as: MIRALAX / GLYCOLAX Take 17 g by mouth daily. Start taking on: January 25, 2020   propranolol 20 MG tablet Commonly known as: INDERAL Take 20 mg by mouth daily.   rosuvastatin 10 MG tablet Commonly known as: CRESTOR Take 10 mg by mouth at bedtime.   senna 8.6 MG Tabs tablet Commonly  known as: SENOKOT Take 1 tablet (8.6 mg total) by mouth at bedtime.   traMADol 50 MG tablet Commonly known as: ULTRAM Take 1 tablet (50 mg total) by mouth every 12 (twelve) hours as needed for moderate pain. What changed:   when to take this  reasons to take this   traZODone 50 MG tablet Commonly known as: DESYREL Take 1 tablet (50 mg total) by mouth at bedtime.   Vagifem 10 MCG Tabs vaginal tablet Generic drug: Estradiol Place 10 mcg vaginally once a week.   VITAMIN D-3 PO Take 1 capsule by mouth daily.      Allergies  Allergen Reactions  . Amoxicillin Nausea And Vomiting  . Clindamycin/Lincomycin Nausea And Vomiting  . Morphine And Related Nausea And Vomiting  . Other     Any pain medications. "i have trouble with them".   . Oxycodone Nausea Only  . Sertraline Hcl     Pt's daughter reported (01/19/20) says "it sends her in to mania"    The results of significant diagnostics from this hospitalization (including imaging, microbiology, ancillary and  laboratory) are listed below for reference.    Significant Diagnostic Studies: CT Abdomen Pelvis Wo Contrast  Result Date: 01/18/2020 CLINICAL DATA:  Inability to void, constipation, hypertension EXAM: CT ABDOMEN AND PELVIS WITHOUT CONTRAST TECHNIQUE: Multidetector CT imaging of the abdomen and pelvis was performed following the standard protocol without IV contrast. COMPARISON:  01/30/2012 FINDINGS: Lower chest: Bibasilar subpleural scarring. No acute pleural or parenchymal lung disease. Moderate hiatal hernia. Hepatobiliary: No focal liver abnormality is seen. No gallstones, gallbladder wall thickening, or biliary dilatation. Pancreas: Unremarkable. No pancreatic ductal dilatation or surrounding inflammatory changes. Spleen: Normal in size without focal abnormality. Adrenals/Urinary Tract: Low-attenuation nodule left adrenal gland consistent with adenoma, measuring 1 cm. The right adrenals unremarkable. Stable punctate  bilateral renal calcifications are felt to be parenchymal. No definite urinary tract calculi. No obstructive uropathy within either kidney. Bladder is decompressed by Foley catheter. Stomach/Bowel: No bowel obstruction or ileus. There is a large amount of stool within the rectal vault consistent with fecal impaction. Scattered diverticulosis of the sigmoid colon without diverticulitis. Vascular/Lymphatic: Aortic atherosclerosis. No enlarged abdominal or pelvic lymph nodes. Reproductive: Uterus and bilateral adnexa are unremarkable. Other: No free fluid or free gas.  No abdominal wall hernia. Musculoskeletal: No acute bony abnormalities. Reconstructed images demonstrate no additional findings. IMPRESSION: 1. Fecal impaction, with a large amount of stool within the rectal vault. 2.  Aortic Atherosclerosis (ICD10-I70.0). Electronically Signed   By: Randa Ngo M.D.   On: 01/18/2020 19:18   CT Head Wo Contrast  Result Date: 01/18/2020 CLINICAL DATA:  Mental status change, unknown cause. EXAM: CT HEAD WITHOUT CONTRAST TECHNIQUE: Contiguous axial images were obtained from the base of the skull through the vertex without intravenous contrast. COMPARISON:  No pertinent prior exams are available for comparison. FINDINGS: Brain: Mild-to-moderate generalized cerebral atrophy. 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 Background mild ill-defined hypoattenuation within the cerebral white matter is nonspecific, but compatible chronic small vessel ischemic disease. There is no acute intracranial hemorrhage. No demarcated cortical infarct. No extra-axial fluid collection. No evidence of intracranial mass. No midline shift. Vascular: No hyperdense vessel.  Atherosclerotic calcifications. Skull: Normal. Negative for fracture or focal suspicious osseous lesion. Sinuses/Orbits: Visualized orbits show no acute finding. Trace ethmoid sinus mucosal thickening at the imaged levels.  IMPRESSION: No evidence of acute intracranial abnormality. 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 Background mild cerebral white matter chronic small vessel ischemic disease. Mild-to-moderate cerebral atrophy. Electronically Signed   By: Kellie Simmering DO   On: 01/18/2020 17:16   MR BRAIN WO CONTRAST  Result Date: 01/19/2020 CLINICAL DATA:  Altered mental status. EXAM: MRI HEAD WITHOUT CONTRAST TECHNIQUE: Multiplanar, multiecho pulse sequences of the brain and surrounding structures were obtained without intravenous contrast. COMPARISON:  Head CT January 18, 2020. FINDINGS: Patient unable to lie still for the duration of the study. Images obtained are partially degraded by motion. Brain: No acute infarction, hemorrhage, hydrocephalus, extra-axial collection or mass lesion. Remote small infarct in the right lenticular capsular region. T2 hyperintensity within the periventricular white matter and within the pons, nonspecific, most likely to chronic small vessel ischemia. Moderate parenchymal volume loss. Vascular: Normal flow voids. Skull and upper cervical spine: Degenerative changes at C1-2. No focal marrow signal abnormality. Sinuses/Orbits: Negative. Other: None. IMPRESSION: 1. Limited and motion degraded study. 2. No acute intracranial abnormality. 3. Remote small infarct in the right lenticular capsular region. 4. Moderate parenchymal volume loss and probable chronic small  vessel ischemia. Electronically Signed   By: Pedro Earls M.D.   On: 01/19/2020 09:18   US RENAL  Result Date: 01/20/2020 CLINICAL DATA:  Acute kidney injury EXAM: RENAL ULTRASOUND COMPARISON:  CT abdomen and pelvis January 18, 2020 FINDINGS: Right Kidney: Renal measurements: 9.3 x 5.3 x 4.4 cm = volume: 114.3 mL. Echogenicity is increased. There is mild renal cortical thinning. No mass, perinephric fluid, or hydronephrosis visualized. No sonographically demonstrable  calculus or ureterectasis. Left Kidney: Unable to visualize due to copious gas obscuring the left flank region. Bladder: Decompressed with Foley catheter and cannot be assessed. Other: None. IMPRESSION: 1. Unable to visualize left kidney by ultrasound due to copious left flank region gas. 2. Right kidney demonstrates increased echogenicity and renal cortical thinning, findings indicative of medical renal disease. No obstructing focus in right kidney. 3.  Urinary bladder decompressed with Foley catheter. Electronically Signed   By: Lowella Grip III M.D.   On: 01/20/2020 13:57   DG Chest Port 1 View  Result Date: 01/18/2020 CLINICAL DATA:  Altered mental status EXAM: PORTABLE CHEST 1 VIEW COMPARISON:  January 17, 2020 FINDINGS: The heart size and mediastinal contours are within normal limits. Aortic knob calcifications are seen. Both lungs are clear. The visualized skeletal structures are unremarkable. IMPRESSION: No active disease. Electronically Signed   By: Prudencio Pair M.D.   On: 01/18/2020 15:33   DG Chest Portable 1 View  Result Date: 01/17/2020 CLINICAL DATA:  Shortness of breath. EXAM: PORTABLE CHEST 1 VIEW COMPARISON:  05/14/2015 FINDINGS: The cardiac silhouette, mediastinal and hilar contours are within normal limits given the AP projection, portable technique and low lung volumes. Low lung volumes with vascular crowding and streaky basilar atelectasis. No infiltrates or effusions. No pneumothorax. IMPRESSION: Low lung volumes with vascular crowding and streaky basilar atelectasis. Electronically Signed   By: Marijo Sanes M.D.   On: 01/17/2020 06:32    Microbiology: Recent Results (from the past 240 hour(s))  Urine culture     Status: None   Collection Time: 01/17/20  5:58 AM   Specimen: Urine, Catheterized  Result Value Ref Range Status   Specimen Description   Final    URINE, CATHETERIZED Performed at [facility], [address], [place], [place] [address]    Special Requests    Final    NONE Performed at [facility], [address], [place], [place] [address]    Culture   Final    NO GROWTH Performed at [facility], [place] [address] [place], [place] [address]    Report Status 01/19/2020 FINAL  Final  Culture, blood (single)     Status: None   Collection Time: 01/18/20  3:15 PM   Specimen: Right Antecubital; Blood  Result Value Ref Range Status   Specimen Description RIGHT ANTECUBITAL  Final   Special Requests BOTTLES DRAWN AEROBIC AND ANAEROBIC  Final   Culture   Final    NO GROWTH 5 DAYS Performed at [facility], [address] [place], [place] [address]    Report Status 01/23/2020 FINAL  Final  Respiratory Panel by RT PCR (Flu A&B, Covid) -     Status: None   Collection Time: 01/18/20  3:54 PM   Specimen: Nasopharyngeal  Result Value Ref Range Status   SARS Coronavirus 2 by RT PCR NEGATIVE NEGATIVE Final    Comment: (NOTE) SARS-CoV-2 target nucleic acids are NOT DETECTED.  The SARS-CoV-2 RNA is generally detectable in upper respiratoy specimens during the acute  phase of infection. The lowest concentration of SARS-CoV-2 viral copies this assay can detect is 131 copies/mL. A negative result does not preclude SARS-Cov-2 infection and should not be used as the sole basis for treatment or other patient management decisions. A negative result may occur with  improper specimen collection/handling, submission of specimen other than nasopharyngeal swab, presence of viral mutation(s) within the areas targeted by this assay, and inadequate number of viral copies (<131 copies/mL). A negative result must be combined with clinical observations, patient history, and epidemiological information. The expected result is Negative.  Fact Sheet for Patients:  PinkCheek.be  Fact Sheet for Healthcare Providers:  GravelBags.it  This test is no t yet approved or cleared by the [place] FDA and    has been authorized for detection and/or diagnosis of SARS-CoV-2 by FDA under an Emergency Use Authorization (EUA). This EUA will remain  in effect (meaning this test can be used) for the duration of the COVID-19 declaration under Section 564(b)(1) of the Act, 21 U.S.C. section 360bbb-3(b)(1), unless the authorization is terminated or revoked sooner.     Influenza A by PCR NEGATIVE NEGATIVE Final   Influenza B by PCR NEGATIVE NEGATIVE Final    Comment: (NOTE) The Xpert Xpress SARS-CoV-2/FLU/RSV assay is intended as an aid in  the diagnosis of influenza from Nasopharyngeal swab specimens and  should not be used as a sole basis for treatment. Nasal washings and  aspirates are unacceptable for Xpert Xpress SARS-CoV-2/FLU/RSV  testing.  Fact Sheet for Patients: PinkCheek.be  Fact Sheet for Healthcare Providers: GravelBags.it  This test is not yet approved or cleared by the [place] FDA and  has been authorized for detection and/or diagnosis of SARS-CoV-2 by  FDA under an Emergency Use Authorization (EUA). This EUA will remain  in effect (meaning this test can be used) for the duration of the  Covid-19 declaration under Section 564(b)(1) of the Act, 21  U.S.C. section 360bbb-3(b)(1), unless the authorization is  terminated or revoked. Performed at [facility], [address], [place], [place] [address]      Labs: Basic Metabolic Panel: Recent Labs  Lab 01/19/20 0428 01/19/20 0428 01/20/20 0622 01/21/20 0605 01/22/20 0559 01/23/20 0613 01/24/20 0736  NA 144   < > 136 144 145 141 143  K 3.8   < > 4.6 3.6 3.8 3.8 3.7  CL 110   < > 106 115* 115* 112* 109  CO2 23   < > 21* 20* 24 22 25   GLUCOSE 112*   < > 90 95 106* 106* 100*  BUN 29*   < > 72* 25* 22 25* 27*  CREATININE 1.88*   < > 2.35* 1.53* 1.49* 1.60* 1.60*  CALCIUM 9.6   < > 8.6* 8.9 9.6 9.7 10.0  MG 2.4  --   --   --   --   --   --    < > = values in  this interval not displayed.   Liver Function Tests: Recent Labs  Lab 01/18/20 1515 01/19/20 0428  AST 18 15  ALT 21 17  ALKPHOS 68 60  BILITOT 0.9 0.7  PROT 7.6 6.5  ALBUMIN 4.1 3.4*    Recent Labs  Lab 01/18/20 1515  AMMONIA 20   CBC: Recent Labs  Lab 01/18/20 1515 01/19/20 0428  WBC 13.6* 11.9*  NEUTROABS 11.7* 9.9*  HGB 15.8* 14.6  HCT 48.8* 46.3*  MCV 99.0 100.2*  PLT 240 232   CBG: Recent Labs  Lab  01/18/20 1524  GLUCAP 142*    Principal Problem:   Acute metabolic encephalopathy Active Problems:   Bipolar disease, manic ([place])   AKI superimposed on CKD stage III, suspect b ([place])   Acute urinary retention   CKD stage III, 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

## 2020-01-24 NOTE — Progress Notes (Signed)
Physical Therapy Treatment Patient Details Name: [name] MRN: [id number] DOB: 1940-09-04 Today's Date: 01/24/2020    History of Present Illness [name]  is a 79 y.o. female, with history of tubular adenoma of colon, diverticulosis, depression, bipolar affective disorder, presents to the ED with a chief complaint of altered mental status.  History obtained from daughter.  She reports that patient has been agitated for the last 2 days.  She has had insomnia for the last two nights.  Patient had been complaining of constipation and presented to the ED yesterday for constipation, polyuria, and insomnia.  Patient was discharged with advised to urgently follow-up with PCP.  Patient's change in mental status acutely started today.  She has been calling out "help me" all day. This is new for her. At baseline she can hold a conversation, but sometimes forgets mid conversation what is being discussed.  Today she can hold conversation at all.  She was not complaining of any pain.  Family has not noticed her to have a fever.  She has not had a cough, been throwing up, or had diarrhea -as mentioned above she has been constipated.    PT Comments    Patient demonstrates improved functional mobility performance today requiring less physical assistance for bed mobility, transfers, and ambulation on level surfaces. Continues to require frequent redirection to task and sequencing for initiation and follow-through.  Limited by fatigue requiring frequent rest periods between functional tasks   Follow Up Recommendations  SNF;Supervision for mobility/OOB     Equipment Recommendations  None recommended by PT    Recommendations for Other Services       Precautions / Restrictions Precautions Precautions: Fall Restrictions Weight Bearing Restrictions: No    Mobility  Bed Mobility Overal bed mobility: Needs Assistance Bed Mobility: Sit to Supine       Sit to supine: Supervision   General bed  mobility comments: increased time and cues for initiation/sequence  Transfers Overall transfer level: Needs assistance Equipment used: Rolling walker (2 wheeled) Transfers: Sit to/from [facility] Sit to Stand: Min assist Stand pivot transfers: Min assist       General transfer comment: transfer to standing with RW, assist for LE weakness  Ambulation/Gait Ambulation/Gait assistance: Min assist Gait Distance (Feet): 12 Feet Assistive device: Rolling walker (2 wheeled) Gait Pattern/deviations: Step-to pattern Gait velocity: decreased   General Gait Details: small, discontinuous steps   Stairs             Wheelchair Mobility    Modified Rankin (Stroke Patients Only)       Balance                                            Cognition Arousal/Alertness: Awake/alert Behavior During Therapy: WFL for tasks assessed/performed Overall Cognitive Status: Within Functional Limits for tasks assessed                                 General Comments: visual/tactile cues for sequence      Exercises      General Comments        Pertinent Vitals/Pain Pain Assessment: No/denies pain    Home Living                      Prior Function  PT Goals (current goals can now be found in the care plan section) Acute Rehab PT Goals Patient Stated Goal: Return home PT Goal Formulation: With patient Time For Goal Achievement: 02/05/20 Potential to Achieve Goals: Good Progress towards PT goals: Progressing toward goals    Frequency    Min 3X/week      PT Plan Current plan remains appropriate    Co-evaluation              AM-PAC PT "6 Clicks" Mobility   Outcome Measure  Help needed turning from your back to your side while in a flat bed without using bedrails?: A Little Help needed moving from lying on your back to sitting on the side of a flat bed without using bedrails?: A Little Help  needed moving to and from a bed to a chair (including a wheelchair)?: A Little Help needed standing up from a chair using your arms (e.g., wheelchair or bedside chair)?: A Little Help needed to walk in hospital room?: A Lot Help needed climbing 3-5 steps with a railing? : A Lot 6 Click Score: 16    End of Session Equipment Utilized During Treatment: Gait belt Activity Tolerance: Patient limited by fatigue Patient left: with call bell/phone within reach;in chair;with chair alarm set Nurse Communication: Mobility status PT Visit Diagnosis: Unsteadiness on feet (R26.81);Other abnormalities of gait and mobility (R26.89);Muscle weakness (generalized) (M62.81)     Time: 9373-4287 PT Time Calculation (min) (ACUTE ONLY): 25 min  Charges:  $Gait Training: 8-22 mins $Therapeutic Activity: 8-22 mins                    11:42 AM, 01/24/20 M. Sherlyn Lees, PT, DPT Physical Therapist- [facility] Office Number: [phone]

## 2020-01-24 NOTE — Progress Notes (Signed)
Pt discharged via [facility] to [facility] by [facility] staff members. Pt belongings given to [facility] staff by pt's husband Ed. Pt is wearing her ring.

## 2020-01-25 ENCOUNTER — Non-Acute Institutional Stay (SKILLED_NURSING_FACILITY): Payer: Medicare Other | Admitting: Adult Health

## 2020-01-25 ENCOUNTER — Encounter: Payer: Self-pay | Admitting: Adult Health

## 2020-01-25 ENCOUNTER — Other Ambulatory Visit: Payer: Self-pay | Admitting: Adult Health

## 2020-01-25 DIAGNOSIS — F3110 Bipolar disorder, current episode manic without psychotic features, unspecified: Secondary | ICD-10-CM

## 2020-01-25 DIAGNOSIS — N951 Menopausal and female climacteric states: Secondary | ICD-10-CM

## 2020-01-25 DIAGNOSIS — R3589 Other polyuria: Secondary | ICD-10-CM

## 2020-01-25 DIAGNOSIS — E785 Hyperlipidemia, unspecified: Secondary | ICD-10-CM

## 2020-01-25 DIAGNOSIS — I10 Essential (primary) hypertension: Secondary | ICD-10-CM | POA: Insufficient documentation

## 2020-01-25 DIAGNOSIS — G9341 Metabolic encephalopathy: Secondary | ICD-10-CM | POA: Diagnosis not present

## 2020-01-25 DIAGNOSIS — G8929 Other chronic pain: Secondary | ICD-10-CM | POA: Insufficient documentation

## 2020-01-25 DIAGNOSIS — M810 Age-related osteoporosis without current pathological fracture: Secondary | ICD-10-CM | POA: Insufficient documentation

## 2020-01-25 DIAGNOSIS — M545 Low back pain, unspecified: Secondary | ICD-10-CM | POA: Diagnosis not present

## 2020-01-25 DIAGNOSIS — I700 Atherosclerosis of aorta: Secondary | ICD-10-CM

## 2020-01-25 DIAGNOSIS — E232 Diabetes insipidus: Secondary | ICD-10-CM

## 2020-01-25 DIAGNOSIS — K5641 Fecal impaction: Secondary | ICD-10-CM

## 2020-01-25 DIAGNOSIS — R631 Polydipsia: Secondary | ICD-10-CM | POA: Insufficient documentation

## 2020-01-25 DIAGNOSIS — N1832 Chronic kidney disease, stage 3b: Secondary | ICD-10-CM | POA: Diagnosis not present

## 2020-01-25 MED ORDER — TRAMADOL HCL 50 MG PO TABS: 50.0000 mg | ORAL_TABLET | Freq: Two times a day (BID) | ORAL | 0 refills | Status: DC | PRN

## 2020-01-25 MED ORDER — ALPRAZOLAM 0.25 MG PO TABS: 0.1250 mg | ORAL_TABLET | Freq: Every evening | ORAL | 0 refills | Status: AC | PRN

## 2020-01-25 NOTE — Progress Notes (Signed)
Location:    [facility] Room Number: 153/P Place of Service:  SNF (31)   CODE STATUS: DNR  Allergies  Allergen Reactions  . Amoxicillin Nausea And Vomiting  . Clindamycin/Lincomycin Nausea And Vomiting  . Morphine And Related Nausea And Vomiting  . Other     Any pain medications. "i have trouble with them".   . Oxycodone Nausea Only  . Sertraline Hcl     Pt's daughter reported (01/19/20) says "it sends her in to mania"    Chief Complaint  Patient presents with  . Hospitalization Follow-up    Hospitalization Follow Up    HPI:  She is a 79 year old woman who has been hospitalized from 01-18-20 through 01-24-20. She presented to the ED after 2 days of increased agitation and insomnia. She was found to have impaction failed disimpaction and required an aggressive bowel regimen. She had been hospitalized for her bipolar disorder in the past. She was on lithium until 2013 this was stopped due to her renal function. She had done well until the death of her son; and again in October 01, 2022 of this year the death of her grandson. She had been drinking large amounts of water prior to her hospitalization.she had been started on zoloft one week prior to her hospitalization. She takes ultram on a routine basis at home for back pain management. It was determined in the hospital that she did not have serotonin syndrome.  Her renal function will need to be monitored. She is here for short term rehab with her goal to return back home. She is complaining of back pain and states that she was taking ultram three times daily at home.she is focused on her back pain; however; she is alert and oriented.  She will continue to be followed for her chronic illnesses including:    Stage 3b chronic kidney disease:  Bipolar affective disorder current episode manic current episode severity unspecified:   Aortic atherosclerosis:  Past Medical History:  Diagnosis Date  . Bipolar affective disorder ([place])     . Depression   . Diverticulosis of colon 07/29/2007  . Hypotension   . Tubular adenoma of colon 07/29/2007   Dr. Faith Rogue colonoscopy, previous focally adenomatous cecal polyp 2006    Past Surgical History:  Procedure Laterality Date  . COLONOSCOPY  2012   Dr. Gala Romney: Tubular adenoma, diverticulosis.  . COLONOSCOPY N/A 08/01/2015   Procedure: COLONOSCOPY;  Surgeon: Daneil Dolin, MD;  [facility];  Service: Endoscopy;  Laterality: N/A;  1345  . KNEE ARTHROSCOPY    . spinal tumor removal    . TOTAL KNEE ARTHROPLASTY      Social History   Socioeconomic History  . Marital status: Married    Spouse name: Not on file  . Number of children: Not on file  . Years of education: Not on file  . Highest education level: Not on file  Occupational History  . Occupation: retired  Tobacco Use  . Smoking status: Former Smoker    Types: Cigarettes  . Smokeless tobacco: Never Used  Substance and Sexual Activity  . Alcohol use: No  . Drug use: No  . Sexual activity: Not on file  Other Topics Concern  . Not on file  Social History Narrative  . Not on file   Social Determinants of Health   Financial Resource Strain:   . Difficulty of Paying Living Expenses: Not on file  Food Insecurity:   . Worried About Running  Out of Food in the Last Year: Not on file  . Ran Out of Food in the Last Year: Not on file  Transportation Needs:   . Lack of Transportation (Medical): Not on file  . Lack of Transportation (Non-Medical): Not on file  Physical Activity:   . Days of Exercise per Week: Not on file  . Minutes of Exercise per Session: Not on file  Stress:   . Feeling of Stress : Not on file  Social Connections:   . Frequency of Communication with Friends and Family: Not on file  . Frequency of Social Gatherings with Friends and Family: Not on file  . Attends Religious Services: Not on file  . Active Member of Clubs or Organizations: Not on file  . Attends Archivist  Meetings: Not on file  . Marital Status: Not on file  Intimate Partner Violence:   . Fear of Current or Ex-Partner: Not on file  . Emotionally Abused: Not on file  . Physically Abused: Not on file  . Sexually Abused: Not on file   Family History  Problem Relation Age of Onset  . Kidney disease Mother   . Colitis Other        ? type in sibling  . Colon cancer Neg Hx       VITAL SIGNS BP 130/82   Pulse 82   Temp 98 F (36.7 C)   Resp 20   Ht 5\' 5"  (1.651 m)   Wt 138 lb 14.2 oz (63 kg)   SpO2 96%   BMI 23.11 kg/m   Outpatient Encounter Medications as of 01/25/2020  Medication Sig  . alendronate (FOSAMAX) 70 MG tablet Take 70 mg by mouth every Wednesday. Take with a full glass of water on an empty stomach.  . ALPRAZolam (XANAX) 0.25 MG tablet Take 0.125 mg by mouth at bedtime as needed for anxiety or sleep.  Marland Kitchen amLODipine (NORVASC) 5 MG tablet Take 5 mg by mouth at bedtime.  . Cholecalciferol (VITAMIN D-3 PO) Take 1,000 Units by mouth daily.   . Estradiol (VAGIFEM) 10 MCG TABS vaginal tablet Place 10 mcg vaginally once a week.   . NON FORMULARY 2L fluid restriction in 24 hour period. Document total intake from all sources qshift. 7-3 = 815ml 3-11 = 874ml 11-7 = 350ml Every Shift Day, Evening, Night  . NON FORMULARY Diet: NAS with 2L fluid restriction in 24 hours.  . polyethylene glycol (MIRALAX / GLYCOLAX) 17 g packet Take 17 g by mouth daily.  . propranolol (INDERAL) 20 MG tablet Take 20 mg by mouth daily.  . rosuvastatin (CRESTOR) 10 MG tablet Take 10 mg by mouth at bedtime.  . senna (SENOKOT) 8.6 MG TABS tablet Take 1 tablet (8.6 mg total) by mouth at bedtime.  . traMADol (ULTRAM) 50 MG tablet Take 1 tablet (50 mg total) by mouth every 12 (twelve) hours as needed for moderate pain.  . traZODone (DESYREL) 50 MG tablet Take 1 tablet (50 mg total) by mouth at bedtime.  . [DISCONTINUED] ALPRAZolam (XANAX) 0.25 MG tablet Take 0.5-1 tablets (0.125-0.25 mg total) by mouth at  bedtime as needed for anxiety.   No facility-administered encounter medications on file as of 01/25/2020.     SIGNIFICANT DIAGNOSTIC EXAMS  TODAY  01-18-20: chest x-ray: No active disease.   01-18-20: ct of head:  No evidence of acute intracranial abnormality. 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 Background mild cerebral white matter chronic small vessel  ischemic disease. Mild-to-moderate cerebral atrophy.  01-18-20: ct of abdomen and pelvis:  1. Fecal impaction, with a large amount of stool within the rectal vault. 2.  Aortic Atherosclerosis   01-19-20: mri of brain:  1. Limited and motion degraded study. 2. No acute intracranial abnormality. 3. Remote small infarct in the right lenticular capsular region. 4. Moderate parenchymal volume loss and probable chronic small vessel ischemia.  01-20-20: renal ultrasound 1. Unable to visualize left kidney by ultrasound due to copious left flank region gas. 2. Right kidney demonstrates increased echogenicity and renal cortical thinning, findings indicative of medical renal disease. No obstructing focus in right kidney. 3.  Urinary bladder decompressed with Foley catheter.  LABS REVIEWED TODAY;   01-18-20: wbc 13.6; hgb 15.8; hct 48.8; mcv 99.0 plt 240; glucose 137; bun 32; creat 1.80; k+ 4.3; na++ 138; ca 10.3; liver normal albumin 4.1 tsh 0.398; blood culture: no growth 01-20-20: glucose 90; bun 72; creat 2.35; k+ 4.6; na++ 136; ca 8.6 01-23-20: glucose 106; bun 25; creat 1.60; k+ 3.8; na++ 141; ca 9.7 01-24-20: glucose 100; bun 27; creat 1.60; k+ 3.7; na++ 143; ca 10.0    Review of Systems  Reason unable to perform ROS: is focused on her back pain     Physical Exam Constitutional:      General: She is not in acute distress.    Appearance: She is well-developed. She is not diaphoretic.  Neck:     Thyroid: No thyromegaly.  Cardiovascular:     Rate and Rhythm: Normal rate and  regular rhythm.     Pulses: Normal pulses.     Heart sounds: Normal heart sounds.  Pulmonary:     Effort: Pulmonary effort is normal. No respiratory distress.     Breath sounds: Normal breath sounds.  Abdominal:     General: Bowel sounds are normal. There is no distension.     Palpations: Abdomen is soft.     Tenderness: There is no abdominal tenderness.  Musculoskeletal:     Cervical back: Neck supple.     Right lower leg: No edema.     Left lower leg: No edema.     Comments: Is able to move all extremities Has kyphosis present   Lymphadenopathy:     Cervical: No cervical adenopathy.  Skin:    General: Skin is warm and dry.  Neurological:     Mental Status: She is alert and oriented to person, place, and time.  Psychiatric:        Mood and Affect: Mood normal.       ASSESSMENT/ PLAN:  TODAY  1. Polydipsia/polyuria: is presently stable; is on 2 liter fluid restriction. Will monitor her renal function   2. Fecal impaction w/ associated constipation: is stable will continue miralax daily and senna daily; will monitor for constipation.   3. Acute metabolic encephalopathy: is presently stable; is more alert; is focused on her back pain today.  4. Stage 3b chronic kidney disease: is stable bun 27; creat 1.60; will monitor  5. Bipolar affective disorder current episode manic current episode severity unspecified: is presently stable is off zoloft due to mania; will continue trazodone 50 mg nightly xanax 0.125 mg nightly as needed   6. Aortic atherosclerosis: is stable will monitor  7. Essential hypertension: is stable b/p 130/82 will continue norvasc 5 mg daily inderal 20 mg daily   8. Hyperlipidemia LDL goal <100: is stable will continue crestor 10 mg daily   9. 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     MD is aware of resident's narcotic use and is in agreement with current plan of care. We will attempt to wean resident as appropriate.  Ok Edwards NP [facility]  Contact [phone] Monday through Friday 8am- 5pm  After hours call [phone]

## 2020-01-26 NOTE — Clinical Social Work Note (Signed)
Patient's PASRR has resulted: [id number] [name] with [facility] updated.

## 2020-01-31 ENCOUNTER — Encounter: Payer: Self-pay | Admitting: Adult Health

## 2020-01-31 ENCOUNTER — Non-Acute Institutional Stay (SKILLED_NURSING_FACILITY): Payer: Medicare Other | Admitting: Adult Health

## 2020-01-31 DIAGNOSIS — F3110 Bipolar disorder, current episode manic without psychotic features, unspecified: Secondary | ICD-10-CM

## 2020-01-31 DIAGNOSIS — I700 Atherosclerosis of aorta: Secondary | ICD-10-CM | POA: Diagnosis not present

## 2020-01-31 DIAGNOSIS — E232 Diabetes insipidus: Secondary | ICD-10-CM

## 2020-01-31 DIAGNOSIS — R3589 Other polyuria: Secondary | ICD-10-CM | POA: Diagnosis not present

## 2020-01-31 DIAGNOSIS — F54 Psychological and behavioral factors associated with disorders or diseases classified elsewhere: Secondary | ICD-10-CM

## 2020-01-31 NOTE — Progress Notes (Signed)
Location:     [facility] Room Number: 153-P Place of Service:  SNF (31)   CODE STATUS: DNR  Allergies  Allergen Reactions  . Amoxicillin Nausea And Vomiting  . Clindamycin/Lincomycin Nausea And Vomiting  . Morphine And Related Nausea And Vomiting  . Other     Any pain medications. "i have trouble with them".   . Oxycodone Nausea Only  . Sertraline Hcl     Pt's daughter reported (01/19/20) says "it sends her in to mania"    Chief Complaint  Patient presents with  . Medical Management of Chronic Issues           Polydipsia/polyuria:     Aortic atherosclerosis:    Bipolar affective disorder current episode manic current episode severity unspecified    HPI:  She is a 79 year old short term rehab patient being seen for the management of her chronic illnesses:  Polydipsia/polyuria:     Aortic atherosclerosis:    Bipolar affective disorder current episode manic current episode severity unspecified. Her daughter is present in room. She is complaining of increased anxiety; worrying; insomnia; and claustrophobia. She has come off lithium in 2013. She was on lamictal in the past but cannot remember why she came off this medication. She tells me that it did help with her mood state. She does have constant thirst. She has great difficulty adhering to the fluid restriction.    Past Medical History:  Diagnosis Date  . Bipolar affective disorder ([place])   . Depression   . Diverticulosis of colon 07/29/2007  . Hypotension   . Tubular adenoma of colon 07/29/2007   Dr. Faith Rogue colonoscopy, previous focally adenomatous cecal polyp 2006    Past Surgical History:  Procedure Laterality Date  . COLONOSCOPY  2012   Dr. Gala Romney: Tubular adenoma, diverticulosis.  . COLONOSCOPY N/A 08/01/2015   Procedure: COLONOSCOPY;  Surgeon: Daneil Dolin, MD;  [facility];  Service: Endoscopy;  Laterality: N/A;  1345  . KNEE ARTHROSCOPY    . spinal tumor removal    . TOTAL  KNEE ARTHROPLASTY      Social History   Socioeconomic History  . Marital status: Married    Spouse name: Not on file  . Number of children: Not on file  . Years of education: Not on file  . Highest education level: Not on file  Occupational History  . Occupation: retired  Tobacco Use  . Smoking status: Former Smoker    Types: Cigarettes  . Smokeless tobacco: Never Used  Vaping Use  . Vaping Use: Never used  Substance and Sexual Activity  . Alcohol use: No  . Drug use: No  . Sexual activity: Not on file  Other Topics Concern  . Not on file  Social History Narrative  . Not on file   Social Determinants of Health   Financial Resource Strain:   . Difficulty of Paying Living Expenses: Not on file  Food Insecurity:   . Worried About Charity fundraiser in the Last Year: Not on file  . Ran Out of Food in the Last Year: Not on file  Transportation Needs:   . Lack of Transportation (Medical): Not on file  . Lack of Transportation (Non-Medical): Not on file  Physical Activity:   . Days of Exercise per Week: Not on file  . Minutes of Exercise per Session: Not on file  Stress:   . Feeling of Stress : Not on file  Social Connections:   .  Frequency of Communication with Friends and Family: Not on file  . Frequency of Social Gatherings with Friends and Family: Not on file  . Attends Religious Services: Not on file  . Active Member of Clubs or Organizations: Not on file  . Attends Archivist Meetings: Not on file  . Marital Status: Not on file  Intimate Partner Violence:   . Fear of Current or Ex-Partner: Not on file  . Emotionally Abused: Not on file  . Physically Abused: Not on file  . Sexually Abused: Not on file   Family History  Problem Relation Age of Onset  . Kidney disease Mother   . Colitis Other        ? type in sibling  . Colon cancer Neg Hx       VITAL SIGNS BP (!) 141/90   Pulse 82   Temp 98.1 F (36.7 C)   Ht 5\' 5"  (1.651 m)   Wt 152 lb  9.6 oz (69.2 kg)   SpO2 94%   BMI 25.39 kg/m   Outpatient Encounter Medications as of 01/31/2020  Medication Sig  . alendronate (FOSAMAX) 70 MG tablet Take 70 mg by mouth every Wednesday. Take with a full glass of water on an empty stomach.  . ALPRAZolam (XANAX) 0.25 MG tablet Take 0.5 tablets (0.125 mg total) by mouth at bedtime as needed for up to 12 days for anxiety or sleep.  Marland Kitchen amLODipine (NORVASC) 5 MG tablet Take 5 mg by mouth at bedtime.  . Cholecalciferol (VITAMIN D-3 PO) Take 1,000 Units by mouth daily.   . Estradiol (VAGIFEM) 10 MCG TABS vaginal tablet Place 10 mcg vaginally once a week.   . lamoTRIgine (LAMICTAL) 25 MG tablet Take 25 mg by mouth at bedtime.  . NON FORMULARY 2L fluid restriction in 24 hour period. Document total intake from all sources qshift. 7-3 = 842ml 3-11 = 853ml 11-7 = 311ml Every Shift Day, Evening, Night  . NON FORMULARY Diet: NAS with 2L fluid restriction in 24 hours.  . polyethylene glycol (MIRALAX / GLYCOLAX) 17 g packet Take 17 g by mouth daily.  . propranolol (INDERAL) 20 MG tablet Take 20 mg by mouth daily.  . rosuvastatin (CRESTOR) 10 MG tablet Take 10 mg by mouth at bedtime.  . senna (SENOKOT) 8.6 MG TABS tablet Take 1 tablet (8.6 mg total) by mouth at bedtime.  . traMADol (ULTRAM) 50 MG tablet Take 50 mg by mouth in the morning, at noon, and at bedtime. 9 am, 2 pm, and 9 pm  . traZODone (DESYREL) 50 MG tablet Take 1 tablet (50 mg total) by mouth at bedtime.  . [DISCONTINUED] traMADol (ULTRAM) 50 MG tablet Take 1 tablet (50 mg total) by mouth every 12 (twelve) hours as needed for moderate pain. (Patient not taking: Reported on 01/31/2020)   No facility-administered encounter medications on file as of 01/31/2020.     SIGNIFICANT DIAGNOSTIC EXAMS   PREVIOUS   01-18-20: chest x-ray: No active disease.   01-18-20: ct of head:  No evidence of acute intracranial abnormality. Chronic infarcts within the right corona radiata/basal ganglia  and left basal ganglia. Background mild cerebral white matter chronic small vessel ischemic disease. Mild-to-moderate cerebral atrophy.  01-18-20: ct of abdomen and pelvis:  1. Fecal impaction, with a large amount of stool within the rectal vault. 2.  Aortic Atherosclerosis   01-19-20: mri of brain:  1. Limited and motion degraded study. 2. No acute intracranial abnormality. 3. Remote small infarct in the  right lenticular capsular region. 4. Moderate parenchymal volume loss and probable chronic small vessel ischemia.  01-20-20: renal ultrasound 1. Unable to visualize left kidney by ultrasound due to copious left flank region gas. 2. Right kidney demonstrates increased echogenicity and renal cortical thinning, findings indicative of medical renal disease. No obstructing focus in right kidney. 3.  Urinary bladder decompressed with Foley catheter.  NO NEW EXAMS   LABS REVIEWED PREVIOUS   01-18-20: wbc 13.6; hgb 15.8; hct 48.8; mcv 99.0 plt 240; glucose 137; bun 32; creat 1.80; k+ 4.3; na++ 138; ca 10.3; liver normal albumin 4.1 tsh 0.398; blood culture: no growth 01-20-20: glucose 90; bun 72; creat 2.35; k+ 4.6; na++ 136; ca 8.6 01-23-20: glucose 106; bun 25; creat 1.60; k+ 3.8; na++ 141; ca 9.7 01-24-20: glucose 100; bun 27; creat 1.60; k+ 3.7; na++ 143; ca 10.0   NO NEW LABS.    Review of Systems  Constitutional: Negative for malaise/fatigue.  Respiratory: Negative for cough and shortness of breath.   Cardiovascular: Negative for chest pain, palpitations and leg swelling.  Gastrointestinal: Negative for abdominal pain, constipation and heartburn.  Musculoskeletal: Negative for back pain, joint pain and myalgias.  Skin: Negative.   Neurological: Negative for dizziness.  Psychiatric/Behavioral: The patient is nervous/anxious and has insomnia.      Physical Exam Constitutional:      General: She is not in acute distress.    Appearance: She is well-developed. She is not  diaphoretic.  Neck:     Thyroid: No thyromegaly.  Cardiovascular:     Rate and Rhythm: Normal rate and regular rhythm.     Heart sounds: Normal heart sounds.  Pulmonary:     Effort: Pulmonary effort is normal. No respiratory distress.     Breath sounds: Normal breath sounds.  Abdominal:     General: Bowel sounds are normal. There is no distension.     Palpations: Abdomen is soft.     Tenderness: There is no abdominal tenderness.  Musculoskeletal:     Right lower leg: No edema.     Left lower leg: No edema.     Comments: Is able to move all extremities Has kyphosis present  Lymphadenopathy:     Cervical: No cervical adenopathy.  Skin:    General: Skin is warm and dry.  Neurological:     Mental Status: She is alert. Mental status is at baseline.  Psychiatric:        Mood and Affect: Mood normal.     ASSESSMENT/ PLAN:  TODAY  1. Polydipsia/polyuria: is presently stable remains on 2 liter fluid restriction; has great difficulty adhering to the restriction will monitor  2. Aortic atherosclerosis: is stable will monitor   3. Bipolar affective disorder current episode manic current episode severity unspecified: is having increased anxiety; feelings of claustrophobia; insomnia. Is off zoloft due to mania; will continue trazodone 50 mg nightly xanax 0.125 mg nightly as needed through 02-06-20: will begin lamictal 25 mg nightly to help stabilize mood.    PREVIOUS   4. Acute metabolic encephalopathy: is presently stable; is more alert; is focused on her back pain today  5. Stage 3b chronic kidney disease: is stable bun 27; creat 1.60; will monitor  6. Essential hypertension: is stable b/p 130/82 will continue norvasc 5 mg daily inderal 20 mg daily   7. Hyperlipidemia LDL goal <100: is stable will continue crestor 10 mg daily   8. Post menopausal syndrome: is stable will continue estradiol 10 mcg vaginally weekly  9. Post menopausal osteoporosis: is stable will continue  supplements and fosamax 70 mg weekly   10. 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   11. Fecal impaction w/ associated constipation: is stable will continue miralax daily and senna daily; will monitor for constipation.    MD is aware of resident's narcotic use and is in agreement with current plan of care. We will attempt to wean resident as appropriate.  Ok Edwards NP [facility]  Contact [phone] Monday through Friday 8am- 5pm  After hours call [phone]

## 2020-02-01 ENCOUNTER — Non-Acute Institutional Stay (SKILLED_NURSING_FACILITY): Payer: Medicare Other | Admitting: Internal Medicine

## 2020-02-01 ENCOUNTER — Encounter: Payer: Self-pay | Admitting: Internal Medicine

## 2020-02-01 DIAGNOSIS — K5641 Fecal impaction: Secondary | ICD-10-CM

## 2020-02-01 DIAGNOSIS — E232 Diabetes insipidus: Secondary | ICD-10-CM | POA: Diagnosis not present

## 2020-02-01 DIAGNOSIS — N1832 Chronic kidney disease, stage 3b: Secondary | ICD-10-CM

## 2020-02-01 DIAGNOSIS — F3110 Bipolar disorder, current episode manic without psychotic features, unspecified: Secondary | ICD-10-CM

## 2020-02-01 DIAGNOSIS — G9341 Metabolic encephalopathy: Secondary | ICD-10-CM | POA: Diagnosis not present

## 2020-02-01 DIAGNOSIS — R631 Polydipsia: Secondary | ICD-10-CM

## 2020-02-01 NOTE — Assessment & Plan Note (Signed)
NP initiated Lamictal 25 mg at bedtime because of persistent anxiety associated with insomnia and restlessness.

## 2020-02-01 NOTE — Assessment & Plan Note (Addendum)
NP initiated Lamictal 25 mg at bedtime because of persistent and anxiety with associated insomnia and restlessness. TSH in the hospital was 0.398 (0.35-4.5).  She denies history of thyroid disease or thyroid supplementation.  Repeat TSH with free T3 and free T4 will be pursued to rule out subclinical hyperthyroidism as contributing factor.

## 2020-02-01 NOTE — Progress Notes (Signed)
NURSING HOME LOCATION:  [facility] ROOM NUMBER: 153/P   CODE STATUS:  DNR  PCP:  Denny Levy, PA  This is a comprehensive admission note to [facility] performed on this date less than 30 days from date of admission. Included are preadmission medical/surgical history; reconciled medication list; family history; social history and comprehensive review of systems.  Corrections and additions to the records were documented. Comprehensive physical exam was also performed. Additionally a clinical summary was entered for each active diagnosis pertinent to this admission in the Problem List to enhance continuity of care.  HPI: Patient was hospitalized 11/10-11/16/2021 with acute metabolic encephalopathy in the context of history of manic bipolar disease.  Imaging with CT and MRI revealed no acute process.  Urinalysis revealed no evidence of UTI. Course was associated with AKI superimposed on CKD stage III, possibly stage IIIb and acute urinary retention. The creatinine rose as high as 2.35 with associated GFR of 21.  At discharge creatinine was 1.60 and GFR 33.  Nephrology does monitor the patient. The patient does have a history of stress-induced mania.  She was on lithium until 2013.  Zoloft and Xanax were initiated in 2022/10/30 of this year after the death of her grandson .This is in the context of maintenance tramadol medication for disc disease. The possibility of serotonin syndrome was raised but clinically was not present based on review of Hunter criteria. A pattern of polydipsia was described by her husband with "massive" amounts of fluid intake.  Despite this history there was no hyponatremia documented. Constipation was attributed to fecal impaction. Outpatient BMP monitor was recommended because of the primary polydipsia.  Zoloft was discontinued.  Bowel regimen was been initiated for constipation with resolution.  Past medical and surgical history: Includes history of  tubular adenoma of the colon; essential hypertension, history of diverticulosis, and chronic depression. 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  She describes chronic disc disease as the reason she takes tramadol.  She is also has had a TKA.  Social history: non drinker; former smoker.  Family history: denied   Review of systems: Her major complaint is severe dry mouth.  She repeatedly asked for more fluids during the interview.  She states that her mouth is so dry that it is difficult to talk. She describes occasional chest pain; she made the comment "they've not been able to figure out why".  She describes intermittent shortness of breath. Depression is reported as chronic. Staff reports persistent anxiety with insomnia & restlessness. As noted she describes "osteo" mainly in the hands especially the right medial thumb/wrist area.   Constitutional: No fever, significant weight change, fatigue  Eyes: No redness, discharge, pain, vision change ENT/mouth: No nasal congestion, purulent discharge, earache, change in hearing, sore throat. Cardiovascular: No palpitations, paroxysmal nocturnal dyspnea, claudication, edema  Respiratory: No cough, sputum production, hemoptysis, significant snoring, apnea  Gastrointestinal: No heartburn, dysphagia, abdominal pain, nausea /vomiting, rectal bleeding, melena,new change in bowels Genitourinary: No dysuria, hematuria, pyuria, incontinence, nocturia Dermatologic: No rash, pruritus, change in appearance of skin Neurologic: No dizziness, headache, syncope, seizures, numbness, tingling Psychiatric: No significant anorexia Endocrine: No change in hair/skin/nails, excessive hunger, excessive urination  Hematologic/lymphatic: No significant bruising, lymphadenopathy, abnormal bleeding Allergy/immunology: No itchy/watery eyes, significant sneezing, urticaria, angioedema  Physical exam:  Pertinent or positive findings: Facies are blank.  There is minimal  ptosis, slightly greater on the left.  Nasolabial folds are decreased.  She is profoundly hard of hearing.  Indeed  the oral mucosa and tongue are dry.  The second heart sound is slightly increased.  There is marked interosseous wasting of the hands especially on the right.  There is enlargement of the PIP/MCP area of the right hand medially.  General appearance: Adequately nourished; no acute distress, increased work of breathing is present.   Lymphatic: No lymphadenopathy about the head, neck, axilla. Eyes: No conjunctival inflammation or lid edema is present. There is no scleral icterus. Ears:  External ear exam shows no significant lesions or deformities.   Nose:  External nasal examination shows no deformity or inflammation. Nasal mucosa are pink and moist without lesions, exudates Oral exam: Lips and gums are healthy appearing.There is no oropharyngeal erythema or exudate. Neck:  No thyromegaly, masses, tenderness noted.    Heart:  Normal rate and regular rhythm. S1 normal without gallop, murmur, click, rub.  Lungs: Chest clear to auscultation without wheezes, rhonchi, rales, rubs. Abdomen: Bowel sounds are normal.  Abdomen is soft and nontender with no organomegaly, hernias, masses. GU: Deferred  Extremities:  No cyanosis, clubbing, edema. Neurologic exam: Balance, Rhomberg, finger to nose testing could not be completed due to clinical state Skin: Warm & dry w/o tenting. No significant lesions or rash.  See clinical summary under each active problem in the Problem List with associated updated therapeutic plan

## 2020-02-01 NOTE — Assessment & Plan Note (Addendum)
Resolved as per patient.

## 2020-02-01 NOTE — Assessment & Plan Note (Signed)
Monitor BMP @ SNF.

## 2020-02-01 NOTE — Patient Instructions (Signed)
See assessment and plan under each diagnosis in the problem list and acutely for this visit 

## 2020-02-01 NOTE — Assessment & Plan Note (Addendum)
She states she has been diagnosed as "osteo" (arthritis).  Evaluation for any associated collagen vascular disease as cause of xerostomia deferred to PCP.

## 2020-02-06 ENCOUNTER — Encounter (HOSPITAL_COMMUNITY)
Admission: RE | Admit: 2020-02-06 | Discharge: 2020-02-06 | Disposition: A | Discharge status: Home / Self Care | Payer: Medicare Other | Source: Skilled Nursing Facility | Attending: Adult Health | Admitting: Adult Health

## 2020-02-06 DIAGNOSIS — F3110 Bipolar disorder, current episode manic without psychotic features, unspecified: Secondary | ICD-10-CM | POA: Insufficient documentation

## 2020-02-06 LAB — T4, FREE: Free T4: 1.03 ng/dL (ref 0.61–1.12)

## 2020-02-06 LAB — TSH: TSH: 0.875 u[IU]/mL (ref 0.350–4.500)

## 2020-02-07 ENCOUNTER — Other Ambulatory Visit: Payer: Self-pay | Admitting: Adult Health

## 2020-02-07 LAB — T3, FREE: T3, Free: 2 pg/mL (ref 2.0–4.4)

## 2020-02-07 MED ORDER — TRAMADOL HCL 50 MG PO TABS: 50.0000 mg | ORAL_TABLET | Freq: Three times a day (TID) | ORAL | 0 refills | Status: DC

## 2020-02-08 ENCOUNTER — Encounter: Payer: Self-pay | Admitting: Adult Health

## 2020-02-08 ENCOUNTER — Non-Acute Institutional Stay (SKILLED_NURSING_FACILITY): Payer: Medicare Other | Admitting: Adult Health

## 2020-02-08 DIAGNOSIS — E785 Hyperlipidemia, unspecified: Secondary | ICD-10-CM

## 2020-02-08 DIAGNOSIS — I10 Essential (primary) hypertension: Secondary | ICD-10-CM

## 2020-02-08 DIAGNOSIS — N1832 Chronic kidney disease, stage 3b: Secondary | ICD-10-CM | POA: Diagnosis not present

## 2020-02-08 NOTE — Progress Notes (Signed)
Location:    [facility] Room Number: 153/P Place of Service:  SNF (31)   CODE STATUS: DNR  Allergies  Allergen Reactions  . Amoxicillin Nausea And Vomiting  . Clindamycin/Lincomycin Nausea And Vomiting  . Morphine And Related Nausea And Vomiting  . Other     Any pain medications. "i have trouble with them".   . Oxycodone Nausea Only  . Sertraline Hcl     Pt's daughter reported (01/19/20) says "it sends her in to mania"    Chief Complaint  Patient presents with  . Short Term Rehab (STR)          Stage 3b chronic kidney disease    Essential hypertension:   Hyperlipidemia LDL goal <100    Weekly follow up for the first 30 days post hospitalization.     HPI:  She is a 79 year old short term rehab patient being seen for the management of her chronic illnesses: Stage 3b chronic kidney disease    Essential hypertension:   Hyperlipidemia LDL goal <100. She denies any anxiety or insomnia. States feels calm. There are no reports of pain present.   Past Medical History:  Diagnosis Date  . Bipolar affective disorder ([place])   . Depression   . Diverticulosis of colon 07/29/2007  . Hypotension   . Tubular adenoma of colon 07/29/2007   Dr. Faith Rogue colonoscopy, previous focally adenomatous cecal polyp 2006    Past Surgical History:  Procedure Laterality Date  . COLONOSCOPY  2012   Dr. Gala Romney: Tubular adenoma, diverticulosis.  . COLONOSCOPY N/A 08/01/2015   Procedure: COLONOSCOPY;  Surgeon: Daneil Dolin, MD;  Location: [facility];  Service: Endoscopy;  Laterality: N/A;  1345  . KNEE ARTHROSCOPY    . spinal tumor removal    . TOTAL KNEE ARTHROPLASTY      Social History   Socioeconomic History  . Marital status: Married    Spouse name: Not on file  . Number of children: Not on file  . Years of education: Not on file  . Highest education level: Not on file  Occupational History  . Occupation: retired  Tobacco Use  . Smoking status: Former Smoker     Types: Cigarettes  . Smokeless tobacco: Never Used  Vaping Use  . Vaping Use: Never used  Substance and Sexual Activity  . Alcohol use: No  . Drug use: No  . Sexual activity: Not on file  Other Topics Concern  . Not on file  Social History Narrative  . Not on file   Social Determinants of Health   Financial Resource Strain:   . Difficulty of Paying Living Expenses: Not on file  Food Insecurity:   . Worried About Charity fundraiser in the Last Year: Not on file  . Ran Out of Food in the Last Year: Not on file  Transportation Needs:   . Lack of Transportation (Medical): Not on file  . Lack of Transportation (Non-Medical): Not on file  Physical Activity:   . Days of Exercise per Week: Not on file  . Minutes of Exercise per Session: Not on file  Stress:   . Feeling of Stress : Not on file  Social Connections:   . Frequency of Communication with Friends and Family: Not on file  . Frequency of Social Gatherings with Friends and Family: Not on file  . Attends Religious Services: Not on file  . Active Member of Clubs or Organizations: Not on file  .  Attends Archivist Meetings: Not on file  . Marital Status: Not on file  Intimate Partner Violence:   . Fear of Current or Ex-Partner: Not on file  . Emotionally Abused: Not on file  . Physically Abused: Not on file  . Sexually Abused: Not on file   Family History  Problem Relation Age of Onset  . Kidney disease Mother   . Colitis Other        ? type in sibling  . Colon cancer Neg Hx       VITAL SIGNS BP (!) 141/90   Pulse 82   Temp 98.1 F (36.7 C)   Resp 18   Ht 5\' 5"  (1.651 m)   Wt 151 lb 3.2 oz (68.6 kg)   SpO2 94%   BMI 25.16 kg/m   Outpatient Encounter Medications as of 02/08/2020  Medication Sig  . acetaminophen (TYLENOL) 325 MG tablet Take 650 mg by mouth every 6 (six) hours as needed.  Marland Kitchen alendronate (FOSAMAX) 70 MG tablet Take 70 mg by mouth every Wednesday. Take with a full glass of water on an  empty stomach.  Marland Kitchen amLODipine (NORVASC) 5 MG tablet Take 5 mg by mouth at bedtime.  . Cholecalciferol (VITAMIN D-3 PO) Take 1,000 Units by mouth daily.   . Estradiol (VAGIFEM) 10 MCG TABS vaginal tablet Place 10 mcg vaginally once a week.   . lamoTRIgine (LAMICTAL) 25 MG tablet Take 25 mg by mouth at bedtime.  . NON FORMULARY 2L fluid restriction in 24 hour period. Document total intake from all sources qshift. 7-3 = 863ml 3-11 = 873ml 11-7 = 361ml Every Shift Day, Evening, Night  . NON FORMULARY Diet: NAS with 2L fluid restriction in 24 hours.  . polyethylene glycol (MIRALAX / GLYCOLAX) 17 g packet Take 17 g by mouth daily.  . propranolol (INDERAL) 20 MG tablet Take 20 mg by mouth daily.  . rosuvastatin (CRESTOR) 10 MG tablet Take 10 mg by mouth at bedtime.  . senna (SENOKOT) 8.6 MG TABS tablet Take 1 tablet (8.6 mg total) by mouth at bedtime.  . traMADol (ULTRAM) 50 MG tablet Take 1 tablet (50 mg total) by mouth in the morning, at noon, and at bedtime. 9 am, 2 pm, and 9 pm  . traZODone (DESYREL) 50 MG tablet Take 1 tablet (50 mg total) by mouth at bedtime.   No facility-administered encounter medications on file as of 02/08/2020.     SIGNIFICANT DIAGNOSTIC EXAMS   PREVIOUS   01-18-20: chest x-ray: No active disease.   01-18-20: ct of head:  No evidence of acute intracranial abnormality. 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 Background mild cerebral white matter chronic small vessel ischemic disease. Mild-to-moderate cerebral atrophy.  01-18-20: ct of abdomen and pelvis:  1. Fecal impaction, with a large amount of stool within the rectal vault. 2.  Aortic Atherosclerosis   01-19-20: mri of brain:  1. Limited and motion degraded study. 2. No acute intracranial abnormality. 3. Remote small infarct in the right lenticular capsular region. 4. Moderate parenchymal volume loss and probable chronic small vessel  ischemia.  01-20-20: renal ultrasound 1. Unable to visualize left kidney by ultrasound due to copious left flank region gas. 2. Right kidney demonstrates increased echogenicity and renal cortical thinning, findings indicative of medical renal disease. No obstructing focus in right kidney. 3.  Urinary bladder decompressed with Foley catheter.  NO NEW EXAMS   LABS REVIEWED PREVIOUS   01-18-20: wbc 13.6;  hgb 15.8; hct 48.8; mcv 99.0 plt 240; glucose 137; bun 32; creat 1.80; k+ 4.3; na++ 138; ca 10.3; liver normal albumin 4.1 tsh 0.398; blood culture: no growth 01-20-20: glucose 90; bun 72; creat 2.35; k+ 4.6; na++ 136; ca 8.6 01-23-20: glucose 106; bun 25; creat 1.60; k+ 3.8; na++ 141; ca 9.7 01-24-20: glucose 100; bun 27; creat 1.60; k+ 3.7; na++ 143; ca 10.0   NO NEW LABS.   Review of Systems  Constitutional: Negative for malaise/fatigue.  Respiratory: Negative for cough and shortness of breath.   Cardiovascular: Negative for chest pain, palpitations and leg swelling.  Gastrointestinal: Negative for abdominal pain, constipation and heartburn.  Musculoskeletal: Negative for back pain, joint pain and myalgias.  Skin: Negative.   Neurological: Negative for dizziness.  Psychiatric/Behavioral: The patient is not nervous/anxious.      Physical Exam Constitutional:      General: She is not in acute distress.    Appearance: She is well-developed. She is not diaphoretic.  Neck:     Thyroid: No thyromegaly.  Cardiovascular:     Rate and Rhythm: Normal rate and regular rhythm.     Pulses: Normal pulses.     Heart sounds: Normal heart sounds.  Pulmonary:     Effort: Pulmonary effort is normal. No respiratory distress.     Breath sounds: Normal breath sounds.  Abdominal:     General: Bowel sounds are normal. There is no distension.     Palpations: Abdomen is soft.     Tenderness: There is no abdominal tenderness.  Musculoskeletal:        General: Normal range of motion.     Cervical  back: Neck supple.     Right lower leg: No edema.     Left lower leg: No edema.     Comments: Is able to move all extremities Kyphosis   Lymphadenopathy:     Cervical: No cervical adenopathy.  Skin:    General: Skin is warm and dry.  Neurological:     Mental Status: She is alert. Mental status is at baseline.  Psychiatric:        Mood and Affect: Mood normal.       ASSESSMENT/ PLAN:  TODAY  1. Stage 3b chronic kidney disease is stable bun 27; creat 1.60 will monitor  2. Essential hypertension: is stable 141/90 will continue norvasc 5 mg daily inderal 20 mg daily   3. Hyperlipidemia LDL goal <100. Is stable will continue crestor 10 mg daily   PREVIOUS   4. Post menopausal syndrome: is stable will continue estradiol 10 mcg vaginally weekly   5. Post menopausal osteoporosis: is stable will continue supplements and fosamax 70 mg weekly   6. 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   7. Fecal impaction w/ associated constipation: is stable will continue miralax daily and senna daily; will monitor for constipation.   8. Polydipsia/polyuria: is presently stable remains on 2 liter fluid restriction; has great difficulty adhering to the restriction will monitor  9. Aortic atherosclerosis: is stable will monitor   10. Bipolar affective disorder current episode manic current episode severity unspecified: is stable . Is off zoloft due to mania; will continue trazodone 50 mg nightly  lamictal 25 mg nightly to help stabilize mood.         MD is aware of resident's narcotic use and is in agreement with  current plan of care. We will attempt to wean resident as appropriate.  Ok Edwards NP [facility]  Contact [phone] Monday through Friday 8am- 5pm  After hours call [phone]

## 2020-02-09 ENCOUNTER — Non-Acute Institutional Stay (SKILLED_NURSING_FACILITY): Payer: Medicare Other | Admitting: Adult Health

## 2020-02-09 ENCOUNTER — Encounter: Payer: Self-pay | Admitting: Adult Health

## 2020-02-09 DIAGNOSIS — E232 Diabetes insipidus: Secondary | ICD-10-CM

## 2020-02-09 DIAGNOSIS — F54 Psychological and behavioral factors associated with disorders or diseases classified elsewhere: Secondary | ICD-10-CM

## 2020-02-09 DIAGNOSIS — F3110 Bipolar disorder, current episode manic without psychotic features, unspecified: Secondary | ICD-10-CM

## 2020-02-09 DIAGNOSIS — I700 Atherosclerosis of aorta: Secondary | ICD-10-CM

## 2020-02-09 NOTE — Progress Notes (Signed)
Location:    [facility] [place] Room Number: 153/P Place of Service:  SNF (31)   CODE STATUS: DNR  Allergies  Allergen Reactions  . Amoxicillin Nausea And Vomiting  . Clindamycin/Lincomycin Nausea And Vomiting  . Morphine And Related Nausea And Vomiting  . Other     Any pain medications. "i have trouble with them".   . Oxycodone Nausea Only  . Sertraline Hcl     Pt's daughter reported (01/19/20) says "it sends her in to mania"    Chief Complaint  Patient presents with  . Acute Visit    Care Plan Meeting    HPI:  We have come together for her care plan meeting. Family present. BIMS 13/15 mood 9/30. She requires extensive assist with adls is occasionally incontinent of urine and frequently incontinent of bowel. She does feed herself. There are no reports of falls. Her weight is stable. She is participating with therapy. She will be discharged to assisted living in the next 1-2 weeks. There are no reports of pain present. Her mood state is improving since starting lamictal. She continues to be followed for her chronic illnesses including: Aortic atherosclerosis  Primary polydipsia  Bipolar affective disorder current episode manic current episode severity unspecified.   Past Medical History:  Diagnosis Date  . Bipolar affective disorder ([place])   . Depression   . Diverticulosis of colon 07/29/2007  . Hypotension   . Tubular adenoma of colon 07/29/2007   Dr. Faith Rogue colonoscopy, previous focally adenomatous cecal polyp 2006    Past Surgical History:  Procedure Laterality Date  . COLONOSCOPY  2012   Dr. Gala Romney: Tubular adenoma, diverticulosis.  . COLONOSCOPY N/A 08/01/2015   Procedure: COLONOSCOPY;  Surgeon: Daneil Dolin, MD;  [facility]: [facility];  Service: Endoscopy;  Laterality: N/A;  1345  . KNEE ARTHROSCOPY    . spinal tumor removal    . TOTAL KNEE ARTHROPLASTY      Social History   Socioeconomic History  . Marital status: Married    Spouse  name: Not on file  . Number of children: Not on file  . Years of education: Not on file  . Highest education level: Not on file  Occupational History  . Occupation: retired  Tobacco Use  . Smoking status: Former Smoker    Types: Cigarettes  . Smokeless tobacco: Never Used  Vaping Use  . Vaping Use: Never used  Substance and Sexual Activity  . Alcohol use: No  . Drug use: No  . Sexual activity: Not on file  Other Topics Concern  . Not on file  Social History Narrative  . Not on file   Social Determinants of Health   Financial Resource Strain:   . Difficulty of Paying Living Expenses: Not on file  Food Insecurity:   . Worried About Charity fundraiser in the Last Year: Not on file  . Ran Out of Food in the Last Year: Not on file  Transportation Needs:   . Lack of Transportation (Medical): Not on file  . Lack of Transportation (Non-Medical): Not on file  Physical Activity:   . Days of Exercise per Week: Not on file  . Minutes of Exercise per Session: Not on file  Stress:   . Feeling of Stress : Not on file  Social Connections:   . Frequency of Communication with Friends and Family: Not on file  . Frequency of Social Gatherings with Friends and Family: Not on file  . Attends Religious  Services: Not on file  . Active Member of Clubs or Organizations: Not on file  . Attends Archivist Meetings: Not on file  . Marital Status: Not on file  Intimate Partner Violence:   . Fear of Current or Ex-Partner: Not on file  . Emotionally Abused: Not on file  . Physically Abused: Not on file  . Sexually Abused: Not on file   Family History  Problem Relation Age of Onset  . Kidney disease Mother   . Colitis Other        ? type in sibling  . Colon cancer Neg Hx       VITAL SIGNS BP (!) 141/90   Pulse 82   Temp 98.1 F (36.7 C)   Resp 18   Ht 5\' 5"  (1.651 m)   Wt 151 lb 3.2 oz (68.6 kg)   SpO2 94%   BMI 25.16 kg/m   Outpatient Encounter Medications as of  02/09/2020  Medication Sig  . acetaminophen (TYLENOL) 325 MG tablet Take 650 mg by mouth every 6 (six) hours as needed.  Marland Kitchen alendronate (FOSAMAX) 70 MG tablet Take 70 mg by mouth every Wednesday. Take with a full glass of water on an empty stomach.  Marland Kitchen amLODipine (NORVASC) 5 MG tablet Take 5 mg by mouth at bedtime.  . Cholecalciferol (VITAMIN D-3 PO) Take 1,000 Units by mouth daily.   . Estradiol (VAGIFEM) 10 MCG TABS vaginal tablet Place 10 mcg vaginally once a week.   . lamoTRIgine (LAMICTAL) 25 MG tablet Take 25 mg by mouth at bedtime.  . NON FORMULARY 2L fluid restriction in 24 hour period. Document total intake from all sources qshift. 7-3 = 825ml 3-11 = 887ml 11-7 = 392ml Every Shift Day, Evening, Night  . NON FORMULARY Diet: NAS with 2L fluid restriction in 24 hours.  . polyethylene glycol (MIRALAX / GLYCOLAX) 17 g packet Take 17 g by mouth daily.  . propranolol (INDERAL) 20 MG tablet Take 20 mg by mouth daily.  . rosuvastatin (CRESTOR) 10 MG tablet Take 10 mg by mouth at bedtime.  . senna (SENOKOT) 8.6 MG TABS tablet Take 1 tablet (8.6 mg total) by mouth at bedtime.  . traZODone (DESYREL) 50 MG tablet Take 1 tablet (50 mg total) by mouth at bedtime.  . [DISCONTINUED] traMADol (ULTRAM) 50 MG tablet Take 1 tablet (50 mg total) by mouth in the morning, at noon, and at bedtime. 9 am, 2 pm, and 9 pm   No facility-administered encounter medications on file as of 02/09/2020.     SIGNIFICANT DIAGNOSTIC EXAMS   PREVIOUS   01-18-20: chest x-ray: No active disease.   01-18-20: ct of head:  No evidence of acute intracranial abnormality. 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 Background mild cerebral white matter chronic small vessel ischemic disease. Mild-to-moderate cerebral atrophy.  01-18-20: ct of abdomen and pelvis:  1. Fecal impaction, with a large amount of stool within the rectal vault. 2.  Aortic Atherosclerosis   01-19-20:  mri of brain:  1. Limited and motion degraded study. 2. No acute intracranial abnormality. 3. Remote small infarct in the right lenticular capsular region. 4. Moderate parenchymal volume loss and probable chronic small vessel ischemia.  01-20-20: renal ultrasound 1. Unable to visualize left kidney by ultrasound due to copious left flank region gas. 2. Right kidney demonstrates increased echogenicity and renal cortical thinning, findings indicative of medical renal disease. No obstructing focus in right kidney. 3.  Urinary bladder  decompressed with Foley catheter.  NO NEW EXAMS   LABS REVIEWED PREVIOUS   01-18-20: wbc 13.6; hgb 15.8; hct 48.8; mcv 99.0 plt 240; glucose 137; bun 32; creat 1.80; k+ 4.3; na++ 138; ca 10.3; liver normal albumin 4.1 tsh 0.398; blood culture: no growth 01-20-20: glucose 90; bun 72; creat 2.35; k+ 4.6; na++ 136; ca 8.6 01-23-20: glucose 106; bun 25; creat 1.60; k+ 3.8; na++ 141; ca 9.7 01-24-20: glucose 100; bun 27; creat 1.60; k+ 3.7; na++ 143; ca 10.0   NO NEW LABS.    Review of Systems  Constitutional: Negative for malaise/fatigue.  Respiratory: Negative for cough and shortness of breath.   Cardiovascular: Negative for chest pain, palpitations and leg swelling.  Gastrointestinal: Negative for abdominal pain, constipation and heartburn.  Musculoskeletal: Negative for back pain, joint pain and myalgias.  Skin: Negative.   Neurological: Negative for dizziness.  Psychiatric/Behavioral: The patient is not nervous/anxious.     Physical Exam Constitutional:      General: She is not in acute distress.    Appearance: She is well-developed. She is not diaphoretic.  Neck:     Thyroid: No thyromegaly.  Cardiovascular:     Rate and Rhythm: Normal rate and regular rhythm.     Pulses: Normal pulses.     Heart sounds: Normal heart sounds.  Pulmonary:     Effort: Pulmonary effort is normal. No respiratory distress.     Breath sounds: Normal breath sounds.    Abdominal:     General: Bowel sounds are normal. There is no distension.     Palpations: Abdomen is soft.     Tenderness: There is no abdominal tenderness.  Musculoskeletal:        General: Normal range of motion.     Cervical back: Neck supple.     Right lower leg: No edema.     Left lower leg: No edema.     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  Skin:    General: Skin is warm and dry.  Neurological:     Mental Status: She is alert. Mental status is at baseline.  Psychiatric:        Mood and Affect: Mood normal.       ASSESSMENT/ PLAN:  TODAY  1. Aortic atherosclerosis  2. Primary polydipsia  3. Bipolar affective disorder current episode manic current episode severity unspecified.   Will continue current plan of care Will continue therapy as directed Will continue current medications Her goal is to go to assisted living.    MD is aware of resident's narcotic use and is in agreement with current plan of care. We will attempt to wean resident as appropriate.  Ok Edwards NP [facility]  Contact [phone] Monday through Friday 8am- 5pm  After hours call [phone]

## 2020-02-13 ENCOUNTER — Other Ambulatory Visit: Payer: Self-pay | Admitting: Adult Health

## 2020-02-13 ENCOUNTER — Encounter: Payer: Self-pay | Admitting: Adult Health

## 2020-02-13 ENCOUNTER — Non-Acute Institutional Stay (SKILLED_NURSING_FACILITY): Payer: Medicare Other | Admitting: Adult Health

## 2020-02-13 DIAGNOSIS — I700 Atherosclerosis of aorta: Secondary | ICD-10-CM | POA: Diagnosis not present

## 2020-02-13 DIAGNOSIS — K5641 Fecal impaction: Secondary | ICD-10-CM | POA: Diagnosis not present

## 2020-02-13 DIAGNOSIS — E232 Diabetes insipidus: Secondary | ICD-10-CM

## 2020-02-13 DIAGNOSIS — R631 Polydipsia: Secondary | ICD-10-CM

## 2020-02-13 DIAGNOSIS — G9341 Metabolic encephalopathy: Secondary | ICD-10-CM

## 2020-02-13 NOTE — Progress Notes (Signed)
Location:    [place] Room Number: 153/P Place of Service:  SNF (31)    CODE STATUS: DNR  Allergies  Allergen Reactions  . Amoxicillin Nausea And Vomiting  . Clindamycin/Lincomycin Nausea And Vomiting  . Morphine And Related Nausea And Vomiting  . Other     Any pain medications. "i have trouble with them".   . Oxycodone Nausea Only  . Sertraline Hcl     Pt's daughter reported (01/19/20) says "it sends her in to mania"    Chief Complaint  Patient presents with  . Discharge Note    Discharge Visit    HPI:  She is being discharged to assisted living. She will not need any dme. Will need home health for pt/ot. Her medications and medical follow up will be provided by the assisted living. She had been hospitalized for confusion and impaction. She was admitted to this facility for short term rehab. She has participated in therapy; but will not be able to return back home. She does require 24 hour care at this time.    Past Medical History:  Diagnosis Date  . Bipolar affective disorder ([place])   . Depression   . Diverticulosis of colon 07/29/2007  . Hypotension   . Tubular adenoma of colon 07/29/2007   Dr. Faith Rogue colonoscopy, previous focally adenomatous cecal polyp 2006    Past Surgical History:  Procedure Laterality Date  . COLONOSCOPY  2012   Dr. Gala Romney: Tubular adenoma, diverticulosis.  . COLONOSCOPY N/A 08/01/2015   Procedure: COLONOSCOPY;  Surgeon: Daneil Dolin, MD;  [facility];  Service: Endoscopy;  Laterality: N/A;  1345  . KNEE ARTHROSCOPY    . spinal tumor removal    . TOTAL KNEE ARTHROPLASTY      Social History   Socioeconomic History  . Marital status: Married    Spouse name: Not on file  . Number of children: Not on file  . Years of education: Not on file  . Highest education level: Not on file  Occupational History  . Occupation: retired  Tobacco Use  . Smoking status: Former Smoker    Types: Cigarettes  .  Smokeless tobacco: Never Used  Vaping Use  . Vaping Use: Never used  Substance and Sexual Activity  . Alcohol use: No  . Drug use: No  . Sexual activity: Not on file  Other Topics Concern  . Not on file  Social History Narrative  . Not on file   Social Determinants of Health   Financial Resource Strain:   . Difficulty of Paying Living Expenses: Not on file  Food Insecurity:   . Worried About Charity fundraiser in the Last Year: Not on file  . Ran Out of Food in the Last Year: Not on file  Transportation Needs:   . Lack of Transportation (Medical): Not on file  . Lack of Transportation (Non-Medical): Not on file  Physical Activity:   . Days of Exercise per Week: Not on file  . Minutes of Exercise per Session: Not on file  Stress:   . Feeling of Stress : Not on file  Social Connections:   . Frequency of Communication with Friends and Family: Not on file  . Frequency of Social Gatherings with Friends and Family: Not on file  . Attends Religious Services: Not on file  . Active Member of Clubs or Organizations: Not on file  . Attends Archivist Meetings: Not on file  . Marital Status:  Not on file  Intimate Partner Violence:   . Fear of Current or Ex-Partner: Not on file  . Emotionally Abused: Not on file  . Physically Abused: Not on file  . Sexually Abused: Not on file   Family History  Problem Relation Age of Onset  . Kidney disease Mother   . Colitis Other        ? type in sibling  . Colon cancer Neg Hx     VITAL SIGNS BP (!) 141/90   Pulse 82   Temp 98.1 F (36.7 C)   Resp 18   Ht 5\' 5"  (1.651 m)   Wt 150 lb 6.4 oz (68.2 kg)   SpO2 94%   BMI 25.03 kg/m   Patient's Medications  New Prescriptions   No medications on file  Previous Medications   ACETAMINOPHEN (TYLENOL) 325 MG TABLET    Take 650 mg by mouth every 6 (six) hours as needed.   ALENDRONATE (FOSAMAX) 70 MG TABLET    Take 70 mg by mouth every Wednesday. Take with a full glass of water on  an empty stomach.   AMLODIPINE (NORVASC) 5 MG TABLET    Take 5 mg by mouth at bedtime.   CHOLECALCIFEROL (VITAMIN D-3 PO)    Take 1,000 Units by mouth daily.    ESTRADIOL (VAGIFEM) 10 MCG TABS VAGINAL TABLET    Place 10 mcg vaginally once a week.    LAMOTRIGINE (LAMICTAL) 25 MG TABLET    Take 25 mg by mouth at bedtime.   NON FORMULARY    2L fluid restriction in 24 hour period. Document total intake from all sources qshift. 7-3 = 861ml 3-11 = 856ml 11-7 = 332ml Every Shift Day, Evening, Night   NON FORMULARY    Diet: NAS with 2L fluid restriction in 24 hours.   POLYETHYLENE GLYCOL (MIRALAX / GLYCOLAX) 17 G PACKET    Take 17 g by mouth daily.   PROPRANOLOL (INDERAL) 20 MG TABLET    Take 20 mg by mouth daily.   ROSUVASTATIN (CRESTOR) 10 MG TABLET    Take 10 mg by mouth at bedtime.   SENNA (SENOKOT) 8.6 MG TABS TABLET    Take 1 tablet (8.6 mg total) by mouth at bedtime.   TRAZODONE (DESYREL) 50 MG TABLET    Take 1 tablet (50 mg total) by mouth at bedtime.  Modified Medications   No medications on file  Discontinued Medications   No medications on file     SIGNIFICANT DIAGNOSTIC EXAMS   PREVIOUS   01-18-20: chest x-ray: No active disease.   01-18-20: ct of head:  No evidence of acute intracranial abnormality. 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 Background mild cerebral white matter chronic small vessel ischemic disease. Mild-to-moderate cerebral atrophy.  01-18-20: ct of abdomen and pelvis:  1. Fecal impaction, with a large amount of stool within the rectal vault. 2.  Aortic Atherosclerosis   01-19-20: mri of brain:  1. Limited and motion degraded study. 2. No acute intracranial abnormality. 3. Remote small infarct in the right lenticular capsular region. 4. Moderate parenchymal volume loss and probable chronic small vessel ischemia.  01-20-20: renal ultrasound 1. Unable to visualize left kidney by ultrasound due to copious  left flank region gas. 2. Right kidney demonstrates increased echogenicity and renal cortical thinning, findings indicative of medical renal disease. No obstructing focus in right kidney. 3.  Urinary bladder decompressed with Foley catheter.  NO NEW EXAMS   LABS  REVIEWED PREVIOUS   01-18-20: wbc 13.6; hgb 15.8; hct 48.8; mcv 99.0 plt 240; glucose 137; bun 32; creat 1.80; k+ 4.3; na++ 138; ca 10.3; liver normal albumin 4.1 tsh 0.398; blood culture: no growth 01-20-20: glucose 90; bun 72; creat 2.35; k+ 4.6; na++ 136; ca 8.6 01-23-20: glucose 106; bun 25; creat 1.60; k+ 3.8; na++ 141; ca 9.7 01-24-20: glucose 100; bun 27; creat 1.60; k+ 3.7; na++ 143; ca 10.0   NO NEW LABS.   Review of Systems  Constitutional: Negative for malaise/fatigue.  Respiratory: Negative for cough and shortness of breath.   Cardiovascular: Negative for chest pain, palpitations and leg swelling.  Gastrointestinal: Negative for abdominal pain, constipation and heartburn.  Musculoskeletal: Negative for back pain, joint pain and myalgias.  Skin: Negative.   Neurological: Negative for dizziness.  Psychiatric/Behavioral: The patient is not nervous/anxious.     Physical Exam Constitutional:      General: She is not in acute distress.    Appearance: She is well-developed. She is not diaphoretic.  Neck:     Thyroid: No thyromegaly.  Cardiovascular:     Rate and Rhythm: Normal rate and regular rhythm.     Pulses: Normal pulses.     Heart sounds: Normal heart sounds.  Pulmonary:     Effort: Pulmonary effort is normal. No respiratory distress.     Breath sounds: Normal breath sounds.  Abdominal:     General: Bowel sounds are normal. There is no distension.     Palpations: Abdomen is soft.     Tenderness: There is no abdominal tenderness.  Musculoskeletal:     Cervical back: Neck supple.     Right lower leg: No edema.     Left lower leg: No edema.     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  Skin:    General: Skin is warm and dry.  Neurological:     Mental Status: She is alert. Mental status is at baseline.  Psychiatric:        Mood and Affect: Mood normal.       ASSESSMENT/ PLAN:   Patient is being discharged with the following home health services:  Pt/ot to evaluate and treat as indicated for gait balance strength adl training.   Patient is being discharged with the following durable medical equipment:  None needed   Patient has been advised to f/u with their PCP in 1-2 weeks to bring them up to date on their rehab stay.  Social services at facility was responsible for arranging this appointment.  Pt was provided with a 30 day supply of prescriptions for medications and refills must be obtained from their PCP.  For controlled substances, a more limited supply may be provided adequate until PCP appointment only.  Her medications will be provided by the assisted living.     Ok Edwards NP [facility]  Contact [phone] Monday through Friday 8am- 5pm  After hours call [phone]

## 2020-02-23 DIAGNOSIS — H9193 Unspecified hearing loss, bilateral: Secondary | ICD-10-CM | POA: Diagnosis not present

## 2020-02-23 DIAGNOSIS — K5730 Diverticulosis of large intestine without perforation or abscess without bleeding: Secondary | ICD-10-CM | POA: Diagnosis not present

## 2020-02-23 DIAGNOSIS — F319 Bipolar disorder, unspecified: Secondary | ICD-10-CM | POA: Diagnosis not present

## 2020-02-23 DIAGNOSIS — Z87891 Personal history of nicotine dependence: Secondary | ICD-10-CM | POA: Diagnosis not present

## 2020-02-23 DIAGNOSIS — Z85038 Personal history of other malignant neoplasm of large intestine: Secondary | ICD-10-CM | POA: Diagnosis not present

## 2020-02-23 DIAGNOSIS — K5641 Fecal impaction: Secondary | ICD-10-CM | POA: Diagnosis not present

## 2020-02-23 DIAGNOSIS — I959 Hypotension, unspecified: Secondary | ICD-10-CM | POA: Diagnosis not present

## 2020-02-23 DIAGNOSIS — R631 Polydipsia: Secondary | ICD-10-CM | POA: Diagnosis not present

## 2020-02-23 DIAGNOSIS — Z96659 Presence of unspecified artificial knee joint: Secondary | ICD-10-CM | POA: Diagnosis not present

## 2020-02-23 DIAGNOSIS — N183 Chronic kidney disease, stage 3 unspecified: Secondary | ICD-10-CM | POA: Diagnosis not present

## 2020-02-23 DIAGNOSIS — I700 Atherosclerosis of aorta: Secondary | ICD-10-CM | POA: Diagnosis not present

## 2020-02-24 DIAGNOSIS — K5641 Fecal impaction: Secondary | ICD-10-CM | POA: Diagnosis not present

## 2020-02-24 DIAGNOSIS — F319 Bipolar disorder, unspecified: Secondary | ICD-10-CM | POA: Diagnosis not present

## 2020-02-24 DIAGNOSIS — I959 Hypotension, unspecified: Secondary | ICD-10-CM | POA: Diagnosis not present

## 2020-02-24 DIAGNOSIS — R631 Polydipsia: Secondary | ICD-10-CM | POA: Diagnosis not present

## 2020-02-24 DIAGNOSIS — H9193 Unspecified hearing loss, bilateral: Secondary | ICD-10-CM | POA: Diagnosis not present

## 2020-02-24 DIAGNOSIS — N183 Chronic kidney disease, stage 3 unspecified: Secondary | ICD-10-CM | POA: Diagnosis not present

## 2020-02-27 DIAGNOSIS — Z79899 Other long term (current) drug therapy: Secondary | ICD-10-CM | POA: Diagnosis not present

## 2020-02-27 DIAGNOSIS — N951 Menopausal and female climacteric states: Secondary | ICD-10-CM | POA: Diagnosis not present

## 2020-02-27 DIAGNOSIS — N1832 Chronic kidney disease, stage 3b: Secondary | ICD-10-CM | POA: Diagnosis not present

## 2020-02-27 DIAGNOSIS — E232 Diabetes insipidus: Secondary | ICD-10-CM | POA: Diagnosis not present

## 2020-02-27 DIAGNOSIS — M810 Age-related osteoporosis without current pathological fracture: Secondary | ICD-10-CM | POA: Diagnosis not present

## 2020-02-27 DIAGNOSIS — K5901 Slow transit constipation: Secondary | ICD-10-CM | POA: Diagnosis not present

## 2020-02-27 DIAGNOSIS — F5104 Psychophysiologic insomnia: Secondary | ICD-10-CM | POA: Diagnosis not present

## 2020-02-27 DIAGNOSIS — Z7189 Other specified counseling: Secondary | ICD-10-CM | POA: Diagnosis not present

## 2020-02-27 DIAGNOSIS — I10 Essential (primary) hypertension: Secondary | ICD-10-CM | POA: Diagnosis not present

## 2020-02-27 DIAGNOSIS — F3110 Bipolar disorder, current episode manic without psychotic features, unspecified: Secondary | ICD-10-CM | POA: Diagnosis not present

## 2020-02-27 DIAGNOSIS — E782 Mixed hyperlipidemia: Secondary | ICD-10-CM | POA: Diagnosis not present

## 2020-02-27 DIAGNOSIS — I700 Atherosclerosis of aorta: Secondary | ICD-10-CM | POA: Diagnosis not present

## 2020-03-01 DIAGNOSIS — N183 Chronic kidney disease, stage 3 unspecified: Secondary | ICD-10-CM | POA: Diagnosis not present

## 2020-03-01 DIAGNOSIS — I959 Hypotension, unspecified: Secondary | ICD-10-CM | POA: Diagnosis not present

## 2020-03-01 DIAGNOSIS — F319 Bipolar disorder, unspecified: Secondary | ICD-10-CM | POA: Diagnosis not present

## 2020-03-01 DIAGNOSIS — H9193 Unspecified hearing loss, bilateral: Secondary | ICD-10-CM | POA: Diagnosis not present

## 2020-03-01 DIAGNOSIS — K5641 Fecal impaction: Secondary | ICD-10-CM | POA: Diagnosis not present

## 2020-03-01 DIAGNOSIS — R631 Polydipsia: Secondary | ICD-10-CM | POA: Diagnosis not present

## 2020-03-06 DIAGNOSIS — I959 Hypotension, unspecified: Secondary | ICD-10-CM | POA: Diagnosis not present

## 2020-03-06 DIAGNOSIS — K5641 Fecal impaction: Secondary | ICD-10-CM | POA: Diagnosis not present

## 2020-03-06 DIAGNOSIS — H9193 Unspecified hearing loss, bilateral: Secondary | ICD-10-CM | POA: Diagnosis not present

## 2020-03-06 DIAGNOSIS — R631 Polydipsia: Secondary | ICD-10-CM | POA: Diagnosis not present

## 2020-03-06 DIAGNOSIS — N183 Chronic kidney disease, stage 3 unspecified: Secondary | ICD-10-CM | POA: Diagnosis not present

## 2020-03-06 DIAGNOSIS — F319 Bipolar disorder, unspecified: Secondary | ICD-10-CM | POA: Diagnosis not present

## 2020-03-07 DIAGNOSIS — N183 Chronic kidney disease, stage 3 unspecified: Secondary | ICD-10-CM | POA: Diagnosis not present

## 2020-03-07 DIAGNOSIS — K5641 Fecal impaction: Secondary | ICD-10-CM | POA: Diagnosis not present

## 2020-03-07 DIAGNOSIS — R631 Polydipsia: Secondary | ICD-10-CM | POA: Diagnosis not present

## 2020-03-07 DIAGNOSIS — I959 Hypotension, unspecified: Secondary | ICD-10-CM | POA: Diagnosis not present

## 2020-03-07 DIAGNOSIS — H9193 Unspecified hearing loss, bilateral: Secondary | ICD-10-CM | POA: Diagnosis not present

## 2020-03-07 DIAGNOSIS — F319 Bipolar disorder, unspecified: Secondary | ICD-10-CM | POA: Diagnosis not present

## 2020-03-09 DIAGNOSIS — F319 Bipolar disorder, unspecified: Secondary | ICD-10-CM | POA: Diagnosis not present

## 2020-03-09 DIAGNOSIS — N183 Chronic kidney disease, stage 3 unspecified: Secondary | ICD-10-CM | POA: Diagnosis not present

## 2020-03-09 DIAGNOSIS — I959 Hypotension, unspecified: Secondary | ICD-10-CM | POA: Diagnosis not present

## 2020-03-09 DIAGNOSIS — H9193 Unspecified hearing loss, bilateral: Secondary | ICD-10-CM | POA: Diagnosis not present

## 2020-03-09 DIAGNOSIS — K5641 Fecal impaction: Secondary | ICD-10-CM | POA: Diagnosis not present

## 2020-03-09 DIAGNOSIS — R631 Polydipsia: Secondary | ICD-10-CM | POA: Diagnosis not present

## 2020-03-12 DIAGNOSIS — R631 Polydipsia: Secondary | ICD-10-CM | POA: Diagnosis not present

## 2020-03-12 DIAGNOSIS — F3110 Bipolar disorder, current episode manic without psychotic features, unspecified: Secondary | ICD-10-CM | POA: Diagnosis not present

## 2020-03-12 DIAGNOSIS — K5901 Slow transit constipation: Secondary | ICD-10-CM | POA: Diagnosis not present

## 2020-03-12 DIAGNOSIS — Z87898 Personal history of other specified conditions: Secondary | ICD-10-CM | POA: Diagnosis not present

## 2020-03-12 DIAGNOSIS — R682 Dry mouth, unspecified: Secondary | ICD-10-CM | POA: Diagnosis not present

## 2020-03-12 DIAGNOSIS — I959 Hypotension, unspecified: Secondary | ICD-10-CM | POA: Diagnosis not present

## 2020-03-12 DIAGNOSIS — Z79899 Other long term (current) drug therapy: Secondary | ICD-10-CM | POA: Diagnosis not present

## 2020-03-12 DIAGNOSIS — I10 Essential (primary) hypertension: Secondary | ICD-10-CM | POA: Diagnosis not present

## 2020-03-12 DIAGNOSIS — E782 Mixed hyperlipidemia: Secondary | ICD-10-CM | POA: Diagnosis not present

## 2020-03-12 DIAGNOSIS — K30 Functional dyspepsia: Secondary | ICD-10-CM | POA: Diagnosis not present

## 2020-03-12 DIAGNOSIS — H9193 Unspecified hearing loss, bilateral: Secondary | ICD-10-CM | POA: Diagnosis not present

## 2020-03-12 DIAGNOSIS — K5641 Fecal impaction: Secondary | ICD-10-CM | POA: Diagnosis not present

## 2020-03-12 DIAGNOSIS — N183 Chronic kidney disease, stage 3 unspecified: Secondary | ICD-10-CM | POA: Diagnosis not present

## 2020-03-12 DIAGNOSIS — F5104 Psychophysiologic insomnia: Secondary | ICD-10-CM | POA: Diagnosis not present

## 2020-03-12 DIAGNOSIS — Z712 Person consulting for explanation of examination or test findings: Secondary | ICD-10-CM | POA: Diagnosis not present

## 2020-03-12 DIAGNOSIS — F319 Bipolar disorder, unspecified: Secondary | ICD-10-CM | POA: Diagnosis not present

## 2020-03-12 DIAGNOSIS — N184 Chronic kidney disease, stage 4 (severe): Secondary | ICD-10-CM | POA: Diagnosis not present

## 2020-03-13 DIAGNOSIS — N183 Chronic kidney disease, stage 3 unspecified: Secondary | ICD-10-CM | POA: Diagnosis not present

## 2020-03-13 DIAGNOSIS — I959 Hypotension, unspecified: Secondary | ICD-10-CM | POA: Diagnosis not present

## 2020-03-13 DIAGNOSIS — F319 Bipolar disorder, unspecified: Secondary | ICD-10-CM | POA: Diagnosis not present

## 2020-03-13 DIAGNOSIS — K5641 Fecal impaction: Secondary | ICD-10-CM | POA: Diagnosis not present

## 2020-03-13 DIAGNOSIS — R631 Polydipsia: Secondary | ICD-10-CM | POA: Diagnosis not present

## 2020-03-13 DIAGNOSIS — H9193 Unspecified hearing loss, bilateral: Secondary | ICD-10-CM | POA: Diagnosis not present

## 2020-03-14 DIAGNOSIS — F319 Bipolar disorder, unspecified: Secondary | ICD-10-CM | POA: Diagnosis not present

## 2020-03-14 DIAGNOSIS — H9193 Unspecified hearing loss, bilateral: Secondary | ICD-10-CM | POA: Diagnosis not present

## 2020-03-14 DIAGNOSIS — K5641 Fecal impaction: Secondary | ICD-10-CM | POA: Diagnosis not present

## 2020-03-14 DIAGNOSIS — I959 Hypotension, unspecified: Secondary | ICD-10-CM | POA: Diagnosis not present

## 2020-03-14 DIAGNOSIS — R631 Polydipsia: Secondary | ICD-10-CM | POA: Diagnosis not present

## 2020-03-14 DIAGNOSIS — N183 Chronic kidney disease, stage 3 unspecified: Secondary | ICD-10-CM | POA: Diagnosis not present

## 2020-03-16 DIAGNOSIS — K5641 Fecal impaction: Secondary | ICD-10-CM | POA: Diagnosis not present

## 2020-03-16 DIAGNOSIS — F319 Bipolar disorder, unspecified: Secondary | ICD-10-CM | POA: Diagnosis not present

## 2020-03-16 DIAGNOSIS — H9193 Unspecified hearing loss, bilateral: Secondary | ICD-10-CM | POA: Diagnosis not present

## 2020-03-16 DIAGNOSIS — N183 Chronic kidney disease, stage 3 unspecified: Secondary | ICD-10-CM | POA: Diagnosis not present

## 2020-03-16 DIAGNOSIS — I959 Hypotension, unspecified: Secondary | ICD-10-CM | POA: Diagnosis not present

## 2020-03-16 DIAGNOSIS — R631 Polydipsia: Secondary | ICD-10-CM | POA: Diagnosis not present

## 2020-03-19 DIAGNOSIS — F319 Bipolar disorder, unspecified: Secondary | ICD-10-CM | POA: Diagnosis not present

## 2020-03-19 DIAGNOSIS — K5641 Fecal impaction: Secondary | ICD-10-CM | POA: Diagnosis not present

## 2020-03-19 DIAGNOSIS — R631 Polydipsia: Secondary | ICD-10-CM | POA: Diagnosis not present

## 2020-03-19 DIAGNOSIS — I959 Hypotension, unspecified: Secondary | ICD-10-CM | POA: Diagnosis not present

## 2020-03-19 DIAGNOSIS — H9193 Unspecified hearing loss, bilateral: Secondary | ICD-10-CM | POA: Diagnosis not present

## 2020-03-19 DIAGNOSIS — Z79899 Other long term (current) drug therapy: Secondary | ICD-10-CM | POA: Diagnosis not present

## 2020-03-19 DIAGNOSIS — N390 Urinary tract infection, site not specified: Secondary | ICD-10-CM | POA: Diagnosis not present

## 2020-03-19 DIAGNOSIS — N183 Chronic kidney disease, stage 3 unspecified: Secondary | ICD-10-CM | POA: Diagnosis not present

## 2020-03-20 DIAGNOSIS — N390 Urinary tract infection, site not specified: Secondary | ICD-10-CM | POA: Diagnosis not present

## 2020-03-21 DIAGNOSIS — I959 Hypotension, unspecified: Secondary | ICD-10-CM | POA: Diagnosis not present

## 2020-03-21 DIAGNOSIS — N183 Chronic kidney disease, stage 3 unspecified: Secondary | ICD-10-CM | POA: Diagnosis not present

## 2020-03-21 DIAGNOSIS — H9193 Unspecified hearing loss, bilateral: Secondary | ICD-10-CM | POA: Diagnosis not present

## 2020-03-21 DIAGNOSIS — F319 Bipolar disorder, unspecified: Secondary | ICD-10-CM | POA: Diagnosis not present

## 2020-03-21 DIAGNOSIS — R631 Polydipsia: Secondary | ICD-10-CM | POA: Diagnosis not present

## 2020-03-21 DIAGNOSIS — K5641 Fecal impaction: Secondary | ICD-10-CM | POA: Diagnosis not present

## 2020-03-23 DIAGNOSIS — N183 Chronic kidney disease, stage 3 unspecified: Secondary | ICD-10-CM | POA: Diagnosis not present

## 2020-03-23 DIAGNOSIS — F319 Bipolar disorder, unspecified: Secondary | ICD-10-CM | POA: Diagnosis not present

## 2020-03-23 DIAGNOSIS — I959 Hypotension, unspecified: Secondary | ICD-10-CM | POA: Diagnosis not present

## 2020-03-23 DIAGNOSIS — K5641 Fecal impaction: Secondary | ICD-10-CM | POA: Diagnosis not present

## 2020-03-23 DIAGNOSIS — H9193 Unspecified hearing loss, bilateral: Secondary | ICD-10-CM | POA: Diagnosis not present

## 2020-03-23 DIAGNOSIS — R631 Polydipsia: Secondary | ICD-10-CM | POA: Diagnosis not present

## 2020-03-24 DIAGNOSIS — K5730 Diverticulosis of large intestine without perforation or abscess without bleeding: Secondary | ICD-10-CM | POA: Diagnosis not present

## 2020-03-24 DIAGNOSIS — Z85038 Personal history of other malignant neoplasm of large intestine: Secondary | ICD-10-CM | POA: Diagnosis not present

## 2020-03-24 DIAGNOSIS — Z96659 Presence of unspecified artificial knee joint: Secondary | ICD-10-CM | POA: Diagnosis not present

## 2020-03-24 DIAGNOSIS — H9193 Unspecified hearing loss, bilateral: Secondary | ICD-10-CM | POA: Diagnosis not present

## 2020-03-24 DIAGNOSIS — I700 Atherosclerosis of aorta: Secondary | ICD-10-CM | POA: Diagnosis not present

## 2020-03-24 DIAGNOSIS — R631 Polydipsia: Secondary | ICD-10-CM | POA: Diagnosis not present

## 2020-03-24 DIAGNOSIS — N183 Chronic kidney disease, stage 3 unspecified: Secondary | ICD-10-CM | POA: Diagnosis not present

## 2020-03-24 DIAGNOSIS — K5641 Fecal impaction: Secondary | ICD-10-CM | POA: Diagnosis not present

## 2020-03-24 DIAGNOSIS — F319 Bipolar disorder, unspecified: Secondary | ICD-10-CM | POA: Diagnosis not present

## 2020-03-24 DIAGNOSIS — I959 Hypotension, unspecified: Secondary | ICD-10-CM | POA: Diagnosis not present

## 2020-03-24 DIAGNOSIS — Z87891 Personal history of nicotine dependence: Secondary | ICD-10-CM | POA: Diagnosis not present

## 2020-03-27 DIAGNOSIS — R631 Polydipsia: Secondary | ICD-10-CM | POA: Diagnosis not present

## 2020-03-27 DIAGNOSIS — F319 Bipolar disorder, unspecified: Secondary | ICD-10-CM | POA: Diagnosis not present

## 2020-03-27 DIAGNOSIS — N183 Chronic kidney disease, stage 3 unspecified: Secondary | ICD-10-CM | POA: Diagnosis not present

## 2020-03-27 DIAGNOSIS — H9193 Unspecified hearing loss, bilateral: Secondary | ICD-10-CM | POA: Diagnosis not present

## 2020-03-27 DIAGNOSIS — I959 Hypotension, unspecified: Secondary | ICD-10-CM | POA: Diagnosis not present

## 2020-03-27 DIAGNOSIS — K5641 Fecal impaction: Secondary | ICD-10-CM | POA: Diagnosis not present

## 2020-03-30 DIAGNOSIS — F319 Bipolar disorder, unspecified: Secondary | ICD-10-CM | POA: Diagnosis not present

## 2020-03-30 DIAGNOSIS — I959 Hypotension, unspecified: Secondary | ICD-10-CM | POA: Diagnosis not present

## 2020-03-30 DIAGNOSIS — K5641 Fecal impaction: Secondary | ICD-10-CM | POA: Diagnosis not present

## 2020-03-30 DIAGNOSIS — R631 Polydipsia: Secondary | ICD-10-CM | POA: Diagnosis not present

## 2020-03-30 DIAGNOSIS — N183 Chronic kidney disease, stage 3 unspecified: Secondary | ICD-10-CM | POA: Diagnosis not present

## 2020-03-30 DIAGNOSIS — H9193 Unspecified hearing loss, bilateral: Secondary | ICD-10-CM | POA: Diagnosis not present

## 2020-04-02 DIAGNOSIS — K5641 Fecal impaction: Secondary | ICD-10-CM | POA: Diagnosis not present

## 2020-04-02 DIAGNOSIS — H9193 Unspecified hearing loss, bilateral: Secondary | ICD-10-CM | POA: Diagnosis not present

## 2020-04-02 DIAGNOSIS — I959 Hypotension, unspecified: Secondary | ICD-10-CM | POA: Diagnosis not present

## 2020-04-02 DIAGNOSIS — R631 Polydipsia: Secondary | ICD-10-CM | POA: Diagnosis not present

## 2020-04-02 DIAGNOSIS — F319 Bipolar disorder, unspecified: Secondary | ICD-10-CM | POA: Diagnosis not present

## 2020-04-02 DIAGNOSIS — N183 Chronic kidney disease, stage 3 unspecified: Secondary | ICD-10-CM | POA: Diagnosis not present

## 2020-04-06 DIAGNOSIS — K5641 Fecal impaction: Secondary | ICD-10-CM | POA: Diagnosis not present

## 2020-04-06 DIAGNOSIS — H9193 Unspecified hearing loss, bilateral: Secondary | ICD-10-CM | POA: Diagnosis not present

## 2020-04-06 DIAGNOSIS — R631 Polydipsia: Secondary | ICD-10-CM | POA: Diagnosis not present

## 2020-04-06 DIAGNOSIS — N183 Chronic kidney disease, stage 3 unspecified: Secondary | ICD-10-CM | POA: Diagnosis not present

## 2020-04-06 DIAGNOSIS — I959 Hypotension, unspecified: Secondary | ICD-10-CM | POA: Diagnosis not present

## 2020-04-06 DIAGNOSIS — F319 Bipolar disorder, unspecified: Secondary | ICD-10-CM | POA: Diagnosis not present

## 2020-04-09 DIAGNOSIS — Z789 Other specified health status: Secondary | ICD-10-CM | POA: Diagnosis not present

## 2020-04-09 DIAGNOSIS — Z0000 Encounter for general adult medical examination without abnormal findings: Secondary | ICD-10-CM | POA: Diagnosis not present

## 2020-04-13 DIAGNOSIS — F319 Bipolar disorder, unspecified: Secondary | ICD-10-CM | POA: Diagnosis not present

## 2020-04-13 DIAGNOSIS — K5641 Fecal impaction: Secondary | ICD-10-CM | POA: Diagnosis not present

## 2020-04-13 DIAGNOSIS — H9193 Unspecified hearing loss, bilateral: Secondary | ICD-10-CM | POA: Diagnosis not present

## 2020-04-13 DIAGNOSIS — I959 Hypotension, unspecified: Secondary | ICD-10-CM | POA: Diagnosis not present

## 2020-04-13 DIAGNOSIS — N183 Chronic kidney disease, stage 3 unspecified: Secondary | ICD-10-CM | POA: Diagnosis not present

## 2020-04-13 DIAGNOSIS — R631 Polydipsia: Secondary | ICD-10-CM | POA: Diagnosis not present

## 2020-04-16 DIAGNOSIS — R82991 Hypocitraturia: Secondary | ICD-10-CM | POA: Diagnosis not present

## 2020-04-16 DIAGNOSIS — R8281 Pyuria: Secondary | ICD-10-CM | POA: Diagnosis not present

## 2020-04-16 DIAGNOSIS — I129 Hypertensive chronic kidney disease with stage 1 through stage 4 chronic kidney disease, or unspecified chronic kidney disease: Secondary | ICD-10-CM | POA: Diagnosis not present

## 2020-04-16 DIAGNOSIS — N184 Chronic kidney disease, stage 4 (severe): Secondary | ICD-10-CM | POA: Diagnosis not present

## 2020-04-16 DIAGNOSIS — Z8639 Personal history of other endocrine, nutritional and metabolic disease: Secondary | ICD-10-CM | POA: Diagnosis not present

## 2020-04-17 DIAGNOSIS — R631 Polydipsia: Secondary | ICD-10-CM | POA: Diagnosis not present

## 2020-04-17 DIAGNOSIS — K5641 Fecal impaction: Secondary | ICD-10-CM | POA: Diagnosis not present

## 2020-04-17 DIAGNOSIS — H9193 Unspecified hearing loss, bilateral: Secondary | ICD-10-CM | POA: Diagnosis not present

## 2020-04-17 DIAGNOSIS — F319 Bipolar disorder, unspecified: Secondary | ICD-10-CM | POA: Diagnosis not present

## 2020-04-17 DIAGNOSIS — N183 Chronic kidney disease, stage 3 unspecified: Secondary | ICD-10-CM | POA: Diagnosis not present

## 2020-04-17 DIAGNOSIS — I959 Hypotension, unspecified: Secondary | ICD-10-CM | POA: Diagnosis not present

## 2020-04-23 DIAGNOSIS — K5901 Slow transit constipation: Secondary | ICD-10-CM | POA: Diagnosis not present

## 2020-04-23 DIAGNOSIS — Z79899 Other long term (current) drug therapy: Secondary | ICD-10-CM | POA: Diagnosis not present

## 2020-04-23 DIAGNOSIS — F5104 Psychophysiologic insomnia: Secondary | ICD-10-CM | POA: Diagnosis not present

## 2020-04-23 DIAGNOSIS — M8580 Other specified disorders of bone density and structure, unspecified site: Secondary | ICD-10-CM | POA: Diagnosis not present

## 2020-04-24 ENCOUNTER — Encounter (HOSPITAL_BASED_OUTPATIENT_CLINIC_OR_DEPARTMENT_OTHER): Payer: Self-pay | Admitting: *Deleted

## 2020-04-24 ENCOUNTER — Other Ambulatory Visit: Payer: Self-pay

## 2020-04-24 ENCOUNTER — Emergency Department (HOSPITAL_BASED_OUTPATIENT_CLINIC_OR_DEPARTMENT_OTHER)
Admission: EM | Admit: 2020-04-24 | Discharge: 2020-04-24 | Disposition: A | Discharge status: Home / Self Care | Payer: Medicare Other | Attending: Emergency Medicine | Admitting: Emergency Medicine

## 2020-04-24 DIAGNOSIS — R339 Retention of urine, unspecified: Secondary | ICD-10-CM | POA: Insufficient documentation

## 2020-04-24 DIAGNOSIS — I129 Hypertensive chronic kidney disease with stage 1 through stage 4 chronic kidney disease, or unspecified chronic kidney disease: Secondary | ICD-10-CM | POA: Diagnosis not present

## 2020-04-24 DIAGNOSIS — Z711 Person with feared health complaint in whom no diagnosis is made: Secondary | ICD-10-CM

## 2020-04-24 DIAGNOSIS — Z86010 Personal history of colonic polyps: Secondary | ICD-10-CM | POA: Diagnosis not present

## 2020-04-24 DIAGNOSIS — N1832 Chronic kidney disease, stage 3b: Secondary | ICD-10-CM | POA: Diagnosis not present

## 2020-04-24 DIAGNOSIS — Z79899 Other long term (current) drug therapy: Secondary | ICD-10-CM | POA: Diagnosis not present

## 2020-04-24 DIAGNOSIS — Z96653 Presence of artificial knee joint, bilateral: Secondary | ICD-10-CM | POA: Diagnosis not present

## 2020-04-24 DIAGNOSIS — N390 Urinary tract infection, site not specified: Secondary | ICD-10-CM | POA: Diagnosis not present

## 2020-04-24 LAB — CBC WITH DIFFERENTIAL/PLATELET
Abs Immature Granulocytes: 0.05 10*3/uL (ref 0.00–0.07)
Basophils Absolute: 0 10*3/uL (ref 0.0–0.1)
Basophils Relative: 0 %
Eosinophils Absolute: 0.1 10*3/uL (ref 0.0–0.5)
Eosinophils Relative: 2 %
HCT: 38.6 % (ref 36.0–46.0)
Hemoglobin: 12.5 g/dL (ref 12.0–15.0)
Immature Granulocytes: 1 %
Lymphocytes Relative: 19 %
Lymphs Abs: 1.6 10*3/uL (ref 0.7–4.0)
MCH: 32.4 pg (ref 26.0–34.0)
MCHC: 32.4 g/dL (ref 30.0–36.0)
MCV: 100 fL (ref 80.0–100.0)
Monocytes Absolute: 0.5 10*3/uL (ref 0.1–1.0)
Monocytes Relative: 6 %
Neutro Abs: 5.9 10*3/uL (ref 1.7–7.7)
Neutrophils Relative %: 72 %
Platelets: 193 10*3/uL (ref 150–400)
RBC: 3.86 MIL/uL — ABNORMAL LOW (ref 3.87–5.11)
RDW: 13.5 % (ref 11.5–15.5)
WBC: 8.1 10*3/uL (ref 4.0–10.5)
nRBC: 0 % (ref 0.0–0.2)

## 2020-04-24 LAB — BASIC METABOLIC PANEL
Anion gap: 10 (ref 5–15)
BUN: 23 mg/dL (ref 8–23)
CO2: 24 mmol/L (ref 22–32)
Calcium: 9.8 mg/dL (ref 8.9–10.3)
Chloride: 107 mmol/L (ref 98–111)
Creatinine, Ser: 1.89 mg/dL — ABNORMAL HIGH (ref 0.44–1.00)
GFR, Estimated: 27 mL/min — ABNORMAL LOW (ref >60)
Glucose, Bld: 112 mg/dL — ABNORMAL HIGH (ref 70–99)
Potassium: 3.6 mmol/L (ref 3.5–5.1)
Sodium: 141 mmol/L (ref 135–145)

## 2020-04-24 LAB — URINALYSIS, ROUTINE W REFLEX MICROSCOPIC
Bilirubin Urine: NEGATIVE
Glucose, UA: NEGATIVE mg/dL
Ketones, ur: NEGATIVE mg/dL
Leukocytes,Ua: NEGATIVE
Nitrite: NEGATIVE
Protein, ur: NEGATIVE mg/dL
Specific Gravity, Urine: 1.005 (ref 1.005–1.030)
pH: 6.5 (ref 5.0–8.0)

## 2020-04-24 LAB — URINALYSIS, MICROSCOPIC (REFLEX)

## 2020-04-24 MED ORDER — TAMSULOSIN HCL 0.4 MG PO CAPS: 0.4000 mg | ORAL_CAPSULE | Freq: Every day | ORAL | 0 refills | Status: DC

## 2020-04-24 NOTE — ED Notes (Signed)
PT bladder scanned at 126ML. No change from first post void

## 2020-04-24 NOTE — ED Triage Notes (Addendum)
C/o urinary retention x 1 day , sent here by UC for eval

## 2020-04-24 NOTE — ED Notes (Addendum)
PT bladder scanned at 102

## 2020-04-24 NOTE — Discharge Instructions (Addendum)
Your work-up today was reassuring.  I have referred you to a urologist, please call to schedule an appointment.  I would also like for you to follow-up with your primary care provider regarding today's encounter for ongoing evaluation and management.  Please continue to take MiraLAX regularly and drink plenty of fluids.  Return to the ED or seek immediate medical attention should you experience any new or worsening symptoms.

## 2020-04-24 NOTE — ED Provider Notes (Signed)
[facility] EMERGENCY DEPARTMENT Provider Note   CSN: [id number] Arrival date & time: 04/24/20  1818     History Chief Complaint  Patient presents with  . Urinary Retention    [name] is a 80 y.o. female with PMH of depression who presents the ED sent from urgent care for a 1 day history of urinary retention.  I reviewed patient's urgent care evaluation that commented that she reported that she had not been able to urinate x7 days.  She lives at a 24-hour assisted nursing facility and has only been "trickling urine".  Daughter and husband commented that she is confused at baseline.  On my examination however, daughter at bedside states that she was perfectly normal yesterday and did not complain of any urinary retention.  She states this all started today.  She suspects that it is in her head rather than pathologic or mechanical issue.  Patient denies any recent illness or infection, fevers, chills, abdominal pain, pain with urination, constipation, inability to pass stool, or other symptoms.  She states that she passed a little bit of stool earlier today.  She is given MiraLAX at her assisted living facility regularly.  No history of stones.  No new back pain.   HPI     Past Medical History:  Diagnosis Date  . Bipolar affective disorder ([place])   . Depression   . Diverticulosis of colon 07/29/2007  . Hypotension   . Tubular adenoma of colon 07/29/2007   Dr. Faith Rogue colonoscopy, previous focally adenomatous cecal polyp 2006    Patient Active Problem List   Diagnosis Date Noted  . Essential hypertension 01/25/2020  . Hyperlipidemia LDL goal <100 01/25/2020  . Post menopausal syndrome 01/25/2020  . Chronic bilateral low back pain without sciatica 01/25/2020  . 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 Polyuria 01/23/2020  . Bipolar disease, manic ([place]) 01/19/2020  . AKI superimposed on CKD stage III, suspect b ([place])  01/19/2020  . Acute urinary retention 01/19/2020  . 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 Fecal impaction w/ associated constipation ([place]) 01/19/2020  . Aortic atherosclerosis ([place]) 01/19/2020  . Acute metabolic encephalopathy 81/82/9937  . History of colonic polyps   . Diverticulosis of colon without hemorrhage   . Chest pain 04/01/2012  . Tubular adenoma of colon 07/08/2010  . Constipation 01/14/2010    Past Surgical History:  Procedure Laterality Date  . COLONOSCOPY  2012   Dr. Gala Romney: Tubular adenoma, diverticulosis.  . COLONOSCOPY N/A 08/01/2015   Procedure: COLONOSCOPY;  Surgeon: Daneil Dolin, MD;  [facility];  Service: Endoscopy;  Laterality: N/A;  1345  . KNEE ARTHROSCOPY    . spinal tumor removal    . TOTAL KNEE ARTHROPLASTY       OB History   No obstetric history on file.     Family History  Problem Relation Age of Onset  . Kidney disease Mother   . Colitis Other        ? type in sibling  . Colon cancer Neg Hx     Social History   Tobacco Use  . Smoking status: Former Smoker    Types: Cigarettes  . Smokeless tobacco: Never Used  Vaping Use  . Vaping Use: Never used  Substance Use Topics  . Alcohol use: No  . Drug use: No    Home Medications Prior to Admission medications   Medication Sig Start Date End Date Taking? Authorizing Provider  tamsulosin (FLOMAX) 0.4 MG CAPS capsule Take 1 capsule (0.4 mg total) by mouth daily. 04/24/20  Yes Corena Herter, PA-C  acetaminophen (TYLENOL) 325 MG tablet Take 650 mg by mouth every 6 (six) hours as needed. 02/01/20   [provider]  alendronate (FOSAMAX) 70 MG tablet Take 70 mg by mouth every Wednesday. Take with a full glass of water on an empty stomach.    [provider]  amLODipine (NORVASC) 5 MG tablet Take 5 mg by mouth at bedtime. 12/19/19   [provider]  Cholecalciferol (VITAMIN D-3 PO) Take 1,000 Units by mouth daily.  01/24/20   [provider]   Estradiol (VAGIFEM) 10 MCG TABS vaginal tablet Place 10 mcg vaginally once a week.     [provider]  lamoTRIgine (LAMICTAL) 25 MG tablet Take 25 mg by mouth at bedtime.    [provider]  NON FORMULARY 2L fluid restriction in 24 hour period. Document total intake from all sources qshift. 7-3 = 844ml 3-11 = 873ml 11-7 = 370ml Every Shift Day, Evening, Night 01/24/20   [provider]  NON FORMULARY Diet: NAS with 2L fluid restriction in 24 hours. 01/24/20   [provider]  polyethylene glycol (MIRALAX / GLYCOLAX) 17 g packet Take 17 g by mouth daily. 01/25/20   Samuella Cota, MD  propranolol (INDERAL) 20 MG tablet Take 20 mg by mouth daily. 01/06/14   [provider]  rosuvastatin (CRESTOR) 10 MG tablet Take 10 mg by mouth at bedtime.    [provider]  senna (SENOKOT) 8.6 MG TABS tablet Take 1 tablet (8.6 mg total) by mouth at bedtime. 01/24/20   Samuella Cota, MD  traZODone (DESYREL) 50 MG tablet Take 1 tablet (50 mg total) by mouth at bedtime. 01/24/20   Samuella Cota, MD    Allergies    Amoxicillin, Clindamycin/lincomycin, Morphine and related, Other, Oxycodone, and Sertraline hcl  Review of Systems   Review of Systems  All other systems reviewed and are negative.   Physical Exam Updated Vital Signs BP (!) 151/84   Pulse 80   Temp 98.3 F (36.8 C) (Oral)   Resp 19   Ht 5\' 5"  (1.651 m)   Wt 70.3 kg   SpO2 97%   BMI 25.79 kg/m   Physical Exam Vitals and nursing note reviewed. Exam conducted with a chaperone present.  Constitutional:      Appearance: Normal appearance.  HENT:     Head: Normocephalic and atraumatic.  Eyes:     General: No scleral icterus.    Conjunctiva/sclera: Conjunctivae normal.  Cardiovascular:     Rate and Rhythm: Normal rate.  Pulmonary:     Effort: Pulmonary effort is normal. No respiratory distress.  Abdominal:     General: Abdomen is flat. There is no distension.      Palpations: Abdomen is soft. There is no mass.     Tenderness: There is no abdominal tenderness. There is no right CVA tenderness, left CVA tenderness or guarding.     Comments: Soft, nondistended.  No areas of tenderness.  Specifically, no suprapubic tenderness.  Genitourinary:    General: Normal vulva.     Comments: No mechanical obstruction visualized. Musculoskeletal:        General: Normal range of motion.  Skin:    General: Skin is dry.  Neurological:     Mental Status: She is alert.     GCS: GCS eye subscore is 4. GCS verbal subscore  is 5. GCS motor subscore is 6.  Psychiatric:        Mood and Affect: Mood normal.        Behavior: Behavior normal.     Comments: Very pleasant.  Hard of hearing.  Possible confusion (chronic).     ED Results / Procedures / Treatments   Labs (all labs ordered are listed, but only abnormal results are displayed) Labs Reviewed  CBC WITH DIFFERENTIAL/PLATELET - Abnormal; Notable for the following components:      Result Value   RBC 3.86 (*)    All other components within normal limits  BASIC METABOLIC PANEL - Abnormal; Notable for the following components:   Glucose, Bld 112 (*)    Creatinine, Ser 1.89 (*)    GFR, Estimated 27 (*)    All other components within normal limits  URINALYSIS, ROUTINE W REFLEX MICROSCOPIC - Abnormal; Notable for the following components:   Color, Urine STRAW (*)    APPearance CLOUDY (*)    Hgb urine dipstick LARGE (*)    All other components within normal limits  URINALYSIS, MICROSCOPIC (REFLEX) - Abnormal; Notable for the following components:   Bacteria, UA FEW (*)    All other components within normal limits  URINE CULTURE    EKG None  Radiology No results found.  Procedures Procedures   Medications Ordered in ED Medications - No data to display  ED Course  I have reviewed the triage vital signs and the nursing notes.  Pertinent labs & imaging results that were available during my care of the  patient were reviewed by me and considered in my medical decision making (see chart for details).    MDM Rules/Calculators/A&P                          [name] was evaluated in Emergency Department on 04/24/2020 for the symptoms described in the history of present illness. She was evaluated in the context of the global COVID-19 pandemic, which necessitated consideration that the patient might be at risk for infection with the SARS-CoV-2 virus that causes COVID-19. Institutional protocols and algorithms that pertain to the evaluation of patients at risk for COVID-19 are in a state of rapid change based on information released by regulatory bodies including the CDC and federal and state organizations. These policies and algorithms were followed during the patient's care in the ED.  I personally reviewed patient's medical chart and all notes from triage and staff during today's encounter. I have also ordered and reviewed all labs and imaging that I felt to be medically necessary in the evaluation of this patient's complaints and with consideration of their physical exam. If needed, translation services were available and utilized.   I have lower suspicion for acute urinary retention.  Bladder scan was unremarkable at 102 cc.  Daughter who was at bedside believes that this is out of confusion.  Patient is still passing bowel movements.  She is not ill-appearing and denies any recent infection.  Daughter states that she was without any complaints yesterday.  Patient denies any new or different back pain, abdominal pain, pain with urination, fevers, or other symptoms.  Renal function appears to be consistent with baseline.  No leukocytosis concerning for infection.  UA is reviewed and no evidence of infection.  We will provide her with a prescription for Flomax and refer her to urology for ongoing evaluation and management.  Otherwise she is stable for discharge.  Discussed case with Dr. Langston Masker who agrees  with assessment and plan.    ED return precautions discussed.  Patient voices understanding and is agreeable to plan.  Final Clinical Impression(s) / ED Diagnoses Final diagnoses:  Concern about urinary tract disease without diagnosis    Rx / DC Orders ED Discharge Orders         Ordered    tamsulosin (FLOMAX) 0.4 MG CAPS capsule  Daily        04/24/20 2112    Ambulatory referral to Urology        04/24/20 2112           Reita Chard 04/24/20 2125    Wyvonnia Dusky, MD 04/25/20 1149

## 2020-04-26 LAB — URINE CULTURE: Culture: NO GROWTH

## 2020-04-30 DIAGNOSIS — Z79899 Other long term (current) drug therapy: Secondary | ICD-10-CM | POA: Diagnosis not present

## 2020-04-30 DIAGNOSIS — G8929 Other chronic pain: Secondary | ICD-10-CM | POA: Diagnosis not present

## 2020-04-30 DIAGNOSIS — M5441 Lumbago with sciatica, right side: Secondary | ICD-10-CM | POA: Diagnosis not present

## 2020-04-30 DIAGNOSIS — Z09 Encounter for follow-up examination after completed treatment for conditions other than malignant neoplasm: Secondary | ICD-10-CM | POA: Diagnosis not present

## 2020-04-30 DIAGNOSIS — F5104 Psychophysiologic insomnia: Secondary | ICD-10-CM | POA: Diagnosis not present

## 2020-04-30 DIAGNOSIS — R339 Retention of urine, unspecified: Secondary | ICD-10-CM | POA: Diagnosis not present

## 2020-05-15 DIAGNOSIS — N189 Chronic kidney disease, unspecified: Secondary | ICD-10-CM | POA: Diagnosis not present

## 2020-05-15 DIAGNOSIS — B351 Tinea unguium: Secondary | ICD-10-CM | POA: Diagnosis not present

## 2020-05-15 DIAGNOSIS — M2141 Flat foot [pes planus] (acquired), right foot: Secondary | ICD-10-CM | POA: Diagnosis not present

## 2020-05-21 DIAGNOSIS — Z1159 Encounter for screening for other viral diseases: Secondary | ICD-10-CM | POA: Diagnosis not present

## 2020-05-21 DIAGNOSIS — Z20828 Contact with and (suspected) exposure to other viral communicable diseases: Secondary | ICD-10-CM | POA: Diagnosis not present

## 2020-05-24 DIAGNOSIS — Z1159 Encounter for screening for other viral diseases: Secondary | ICD-10-CM | POA: Diagnosis not present

## 2020-05-24 DIAGNOSIS — Z20828 Contact with and (suspected) exposure to other viral communicable diseases: Secondary | ICD-10-CM | POA: Diagnosis not present

## 2020-05-28 DIAGNOSIS — Z20828 Contact with and (suspected) exposure to other viral communicable diseases: Secondary | ICD-10-CM | POA: Diagnosis not present

## 2020-05-28 DIAGNOSIS — Z1159 Encounter for screening for other viral diseases: Secondary | ICD-10-CM | POA: Diagnosis not present

## 2020-05-31 DIAGNOSIS — Z1159 Encounter for screening for other viral diseases: Secondary | ICD-10-CM | POA: Diagnosis not present

## 2020-05-31 DIAGNOSIS — Z20828 Contact with and (suspected) exposure to other viral communicable diseases: Secondary | ICD-10-CM | POA: Diagnosis not present

## 2020-06-04 DIAGNOSIS — Z20828 Contact with and (suspected) exposure to other viral communicable diseases: Secondary | ICD-10-CM | POA: Diagnosis not present

## 2020-06-04 DIAGNOSIS — Z1159 Encounter for screening for other viral diseases: Secondary | ICD-10-CM | POA: Diagnosis not present

## 2020-06-07 DIAGNOSIS — Z1159 Encounter for screening for other viral diseases: Secondary | ICD-10-CM | POA: Diagnosis not present

## 2020-06-07 DIAGNOSIS — Z20828 Contact with and (suspected) exposure to other viral communicable diseases: Secondary | ICD-10-CM | POA: Diagnosis not present

## 2020-06-11 DIAGNOSIS — Z20828 Contact with and (suspected) exposure to other viral communicable diseases: Secondary | ICD-10-CM | POA: Diagnosis not present

## 2020-06-11 DIAGNOSIS — Z1159 Encounter for screening for other viral diseases: Secondary | ICD-10-CM | POA: Diagnosis not present

## 2020-06-11 NOTE — Progress Notes (Incomplete)
Chief Complaint: ***  History of Present Illness: [name] is a 80 y.o. year old female ***  Past Medical History:  Diagnosis Date  . Bipolar affective disorder ([place])   . Depression   . Diverticulosis of colon 07/29/2007  . Hypotension   . Tubular adenoma of colon 07/29/2007   Dr. Faith Rogue colonoscopy, previous focally adenomatous cecal polyp 2006    Past Surgical History:  Procedure Laterality Date  . COLONOSCOPY  2012   Dr. Gala Romney: Tubular adenoma, diverticulosis.  . COLONOSCOPY N/A 08/01/2015   Procedure: COLONOSCOPY;  Surgeon: Daneil Dolin, MD;  [facility];  Service: Endoscopy;  Laterality: N/A;  1345  . KNEE ARTHROSCOPY    . spinal tumor removal    . TOTAL KNEE ARTHROPLASTY      Home Medications:  (Not in a hospital admission)   Allergies:  Allergies  Allergen Reactions  . Amoxicillin Nausea And Vomiting  . Clindamycin/Lincomycin Nausea And Vomiting  . Morphine And Related Nausea And Vomiting  . Other     Any pain medications. "i have trouble with them".   . Oxycodone Nausea Only  . Sertraline Hcl     Pt's daughter reported (01/19/20) says "it sends her in to mania"    Family History  Problem Relation Age of Onset  . Kidney disease Mother   . Colitis Other        ? type in sibling  . Colon cancer Neg Hx     Social History:  reports that she has quit smoking. Her smoking use included cigarettes. She has never used smokeless tobacco. She reports that she does not drink alcohol and does not use drugs.  ROS: A complete review of systems was performed.  All systems are negative except for pertinent findings as noted.  Physical Exam:  Vital signs in last 24 hours: @VSRANGES @ General:  Alert and oriented, No acute distress HEENT: Normocephalic, atraumatic Neck: No JVD or lymphadenopathy Cardiovascular: Regular rate  Lungs: Normal inspiratory/expiratory excursion Abdomen: Soft, nontender, nondistended, no abdominal masses Back: No CVA  tenderness Extremities: No edema Neurologic: Grossly intact  Laboratory Data:  No results found for this or any previous visit (from the past 24 hour(s)). No results found for this or any previous visit (from the past 240 hour(s)). Creatinine: No results for input(s): CREATININE in the last 168 hours.  Radiologic Imaging: No results found.  Impression/Assessment:  ***  Plan:  [name] 06/11/2020, 9:34 PM  Yvonne Boxer. Dahlstedt MD

## 2020-06-12 ENCOUNTER — Ambulatory Visit: Payer: Medicare Other | Admitting: Urology

## 2020-06-12 DIAGNOSIS — R339 Retention of urine, unspecified: Secondary | ICD-10-CM

## 2020-06-14 DIAGNOSIS — Z20828 Contact with and (suspected) exposure to other viral communicable diseases: Secondary | ICD-10-CM | POA: Diagnosis not present

## 2020-06-14 DIAGNOSIS — Z1159 Encounter for screening for other viral diseases: Secondary | ICD-10-CM | POA: Diagnosis not present

## 2020-06-18 DIAGNOSIS — Z1159 Encounter for screening for other viral diseases: Secondary | ICD-10-CM | POA: Diagnosis not present

## 2020-06-18 DIAGNOSIS — Z20828 Contact with and (suspected) exposure to other viral communicable diseases: Secondary | ICD-10-CM | POA: Diagnosis not present

## 2020-06-21 DIAGNOSIS — Z20828 Contact with and (suspected) exposure to other viral communicable diseases: Secondary | ICD-10-CM | POA: Diagnosis not present

## 2020-06-21 DIAGNOSIS — Z1159 Encounter for screening for other viral diseases: Secondary | ICD-10-CM | POA: Diagnosis not present

## 2020-06-25 DIAGNOSIS — Z1159 Encounter for screening for other viral diseases: Secondary | ICD-10-CM | POA: Diagnosis not present

## 2020-06-25 DIAGNOSIS — Z20828 Contact with and (suspected) exposure to other viral communicable diseases: Secondary | ICD-10-CM | POA: Diagnosis not present

## 2020-06-28 DIAGNOSIS — Z20828 Contact with and (suspected) exposure to other viral communicable diseases: Secondary | ICD-10-CM | POA: Diagnosis not present

## 2020-06-28 DIAGNOSIS — Z1159 Encounter for screening for other viral diseases: Secondary | ICD-10-CM | POA: Diagnosis not present

## 2020-07-02 DIAGNOSIS — G8929 Other chronic pain: Secondary | ICD-10-CM | POA: Diagnosis not present

## 2020-07-02 DIAGNOSIS — F331 Major depressive disorder, recurrent, moderate: Secondary | ICD-10-CM | POA: Diagnosis not present

## 2020-07-02 DIAGNOSIS — Z79899 Other long term (current) drug therapy: Secondary | ICD-10-CM | POA: Diagnosis not present

## 2020-07-02 DIAGNOSIS — I10 Essential (primary) hypertension: Secondary | ICD-10-CM | POA: Diagnosis not present

## 2020-07-02 DIAGNOSIS — Z20828 Contact with and (suspected) exposure to other viral communicable diseases: Secondary | ICD-10-CM | POA: Diagnosis not present

## 2020-07-02 DIAGNOSIS — Z1159 Encounter for screening for other viral diseases: Secondary | ICD-10-CM | POA: Diagnosis not present

## 2020-07-02 DIAGNOSIS — M5441 Lumbago with sciatica, right side: Secondary | ICD-10-CM | POA: Diagnosis not present

## 2020-07-05 DIAGNOSIS — Z1159 Encounter for screening for other viral diseases: Secondary | ICD-10-CM | POA: Diagnosis not present

## 2020-07-05 DIAGNOSIS — Z20828 Contact with and (suspected) exposure to other viral communicable diseases: Secondary | ICD-10-CM | POA: Diagnosis not present

## 2020-07-09 DIAGNOSIS — Z20828 Contact with and (suspected) exposure to other viral communicable diseases: Secondary | ICD-10-CM | POA: Diagnosis not present

## 2020-07-09 DIAGNOSIS — Z1159 Encounter for screening for other viral diseases: Secondary | ICD-10-CM | POA: Diagnosis not present

## 2020-07-12 DIAGNOSIS — Z20828 Contact with and (suspected) exposure to other viral communicable diseases: Secondary | ICD-10-CM | POA: Diagnosis not present

## 2020-07-12 DIAGNOSIS — Z1159 Encounter for screening for other viral diseases: Secondary | ICD-10-CM | POA: Diagnosis not present

## 2020-07-16 DIAGNOSIS — Z20828 Contact with and (suspected) exposure to other viral communicable diseases: Secondary | ICD-10-CM | POA: Diagnosis not present

## 2020-07-16 DIAGNOSIS — Z1159 Encounter for screening for other viral diseases: Secondary | ICD-10-CM | POA: Diagnosis not present

## 2020-07-19 DIAGNOSIS — Z20828 Contact with and (suspected) exposure to other viral communicable diseases: Secondary | ICD-10-CM | POA: Diagnosis not present

## 2020-07-19 DIAGNOSIS — Z1159 Encounter for screening for other viral diseases: Secondary | ICD-10-CM | POA: Diagnosis not present

## 2020-07-23 DIAGNOSIS — Z1159 Encounter for screening for other viral diseases: Secondary | ICD-10-CM | POA: Diagnosis not present

## 2020-07-23 DIAGNOSIS — Z20828 Contact with and (suspected) exposure to other viral communicable diseases: Secondary | ICD-10-CM | POA: Diagnosis not present

## 2020-07-26 DIAGNOSIS — Z20828 Contact with and (suspected) exposure to other viral communicable diseases: Secondary | ICD-10-CM | POA: Diagnosis not present

## 2020-07-26 DIAGNOSIS — Z1159 Encounter for screening for other viral diseases: Secondary | ICD-10-CM | POA: Diagnosis not present

## 2020-08-02 DIAGNOSIS — Z20828 Contact with and (suspected) exposure to other viral communicable diseases: Secondary | ICD-10-CM | POA: Diagnosis not present

## 2020-08-02 DIAGNOSIS — Z1159 Encounter for screening for other viral diseases: Secondary | ICD-10-CM | POA: Diagnosis not present

## 2020-08-06 DIAGNOSIS — Z20828 Contact with and (suspected) exposure to other viral communicable diseases: Secondary | ICD-10-CM | POA: Diagnosis not present

## 2020-08-06 DIAGNOSIS — Z1159 Encounter for screening for other viral diseases: Secondary | ICD-10-CM | POA: Diagnosis not present

## 2020-08-08 DIAGNOSIS — N184 Chronic kidney disease, stage 4 (severe): Secondary | ICD-10-CM | POA: Diagnosis not present

## 2020-08-09 DIAGNOSIS — Z20828 Contact with and (suspected) exposure to other viral communicable diseases: Secondary | ICD-10-CM | POA: Diagnosis not present

## 2020-08-09 DIAGNOSIS — Z1159 Encounter for screening for other viral diseases: Secondary | ICD-10-CM | POA: Diagnosis not present

## 2020-08-13 DIAGNOSIS — R8281 Pyuria: Secondary | ICD-10-CM | POA: Diagnosis not present

## 2020-08-13 DIAGNOSIS — I129 Hypertensive chronic kidney disease with stage 1 through stage 4 chronic kidney disease, or unspecified chronic kidney disease: Secondary | ICD-10-CM | POA: Diagnosis not present

## 2020-08-13 DIAGNOSIS — N184 Chronic kidney disease, stage 4 (severe): Secondary | ICD-10-CM | POA: Diagnosis not present

## 2020-08-13 DIAGNOSIS — Z20828 Contact with and (suspected) exposure to other viral communicable diseases: Secondary | ICD-10-CM | POA: Diagnosis not present

## 2020-08-13 DIAGNOSIS — Z8639 Personal history of other endocrine, nutritional and metabolic disease: Secondary | ICD-10-CM | POA: Diagnosis not present

## 2020-08-13 DIAGNOSIS — Z1159 Encounter for screening for other viral diseases: Secondary | ICD-10-CM | POA: Diagnosis not present

## 2020-08-13 DIAGNOSIS — R82991 Hypocitraturia: Secondary | ICD-10-CM | POA: Diagnosis not present

## 2020-08-16 DIAGNOSIS — Z1159 Encounter for screening for other viral diseases: Secondary | ICD-10-CM | POA: Diagnosis not present

## 2020-08-16 DIAGNOSIS — Z20828 Contact with and (suspected) exposure to other viral communicable diseases: Secondary | ICD-10-CM | POA: Diagnosis not present

## 2020-08-20 DIAGNOSIS — Z20828 Contact with and (suspected) exposure to other viral communicable diseases: Secondary | ICD-10-CM | POA: Diagnosis not present

## 2020-08-20 DIAGNOSIS — Z1159 Encounter for screening for other viral diseases: Secondary | ICD-10-CM | POA: Diagnosis not present

## 2020-08-23 DIAGNOSIS — Z20828 Contact with and (suspected) exposure to other viral communicable diseases: Secondary | ICD-10-CM | POA: Diagnosis not present

## 2020-08-23 DIAGNOSIS — Z1159 Encounter for screening for other viral diseases: Secondary | ICD-10-CM | POA: Diagnosis not present

## 2020-08-27 DIAGNOSIS — Z20828 Contact with and (suspected) exposure to other viral communicable diseases: Secondary | ICD-10-CM | POA: Diagnosis not present

## 2020-08-27 DIAGNOSIS — I10 Essential (primary) hypertension: Secondary | ICD-10-CM | POA: Diagnosis not present

## 2020-08-27 DIAGNOSIS — N184 Chronic kidney disease, stage 4 (severe): Secondary | ICD-10-CM | POA: Diagnosis not present

## 2020-08-27 DIAGNOSIS — Z79899 Other long term (current) drug therapy: Secondary | ICD-10-CM | POA: Diagnosis not present

## 2020-08-27 DIAGNOSIS — Z1159 Encounter for screening for other viral diseases: Secondary | ICD-10-CM | POA: Diagnosis not present

## 2020-08-27 DIAGNOSIS — R0981 Nasal congestion: Secondary | ICD-10-CM | POA: Diagnosis not present

## 2020-08-28 DIAGNOSIS — U071 COVID-19: Secondary | ICD-10-CM | POA: Diagnosis not present

## 2020-09-03 DIAGNOSIS — E782 Mixed hyperlipidemia: Secondary | ICD-10-CM | POA: Diagnosis not present

## 2020-09-03 DIAGNOSIS — N184 Chronic kidney disease, stage 4 (severe): Secondary | ICD-10-CM | POA: Diagnosis not present

## 2020-09-03 DIAGNOSIS — U071 COVID-19: Secondary | ICD-10-CM | POA: Diagnosis not present

## 2020-09-03 DIAGNOSIS — R0981 Nasal congestion: Secondary | ICD-10-CM | POA: Diagnosis not present

## 2020-09-03 DIAGNOSIS — Z20828 Contact with and (suspected) exposure to other viral communicable diseases: Secondary | ICD-10-CM | POA: Diagnosis not present

## 2020-09-03 DIAGNOSIS — Z79899 Other long term (current) drug therapy: Secondary | ICD-10-CM | POA: Diagnosis not present

## 2020-09-03 DIAGNOSIS — I10 Essential (primary) hypertension: Secondary | ICD-10-CM | POA: Diagnosis not present

## 2020-09-03 DIAGNOSIS — Z1159 Encounter for screening for other viral diseases: Secondary | ICD-10-CM | POA: Diagnosis not present

## 2020-09-06 DIAGNOSIS — Z1159 Encounter for screening for other viral diseases: Secondary | ICD-10-CM | POA: Diagnosis not present

## 2020-09-06 DIAGNOSIS — Z20828 Contact with and (suspected) exposure to other viral communicable diseases: Secondary | ICD-10-CM | POA: Diagnosis not present

## 2020-09-06 DIAGNOSIS — N760 Acute vaginitis: Secondary | ICD-10-CM | POA: Diagnosis not present

## 2020-09-10 DIAGNOSIS — Z20828 Contact with and (suspected) exposure to other viral communicable diseases: Secondary | ICD-10-CM | POA: Diagnosis not present

## 2020-09-10 DIAGNOSIS — Z1159 Encounter for screening for other viral diseases: Secondary | ICD-10-CM | POA: Diagnosis not present

## 2020-09-11 DIAGNOSIS — M810 Age-related osteoporosis without current pathological fracture: Secondary | ICD-10-CM | POA: Diagnosis not present

## 2020-09-11 DIAGNOSIS — Z1231 Encounter for screening mammogram for malignant neoplasm of breast: Secondary | ICD-10-CM | POA: Diagnosis not present

## 2020-09-11 DIAGNOSIS — M85852 Other specified disorders of bone density and structure, left thigh: Secondary | ICD-10-CM | POA: Diagnosis not present

## 2020-09-11 DIAGNOSIS — M85851 Other specified disorders of bone density and structure, right thigh: Secondary | ICD-10-CM | POA: Diagnosis not present

## 2020-09-13 DIAGNOSIS — Z1159 Encounter for screening for other viral diseases: Secondary | ICD-10-CM | POA: Diagnosis not present

## 2020-09-13 DIAGNOSIS — Z20828 Contact with and (suspected) exposure to other viral communicable diseases: Secondary | ICD-10-CM | POA: Diagnosis not present

## 2020-09-17 DIAGNOSIS — Z20828 Contact with and (suspected) exposure to other viral communicable diseases: Secondary | ICD-10-CM | POA: Diagnosis not present

## 2020-09-17 DIAGNOSIS — F319 Bipolar disorder, unspecified: Secondary | ICD-10-CM | POA: Diagnosis not present

## 2020-09-17 DIAGNOSIS — Z79899 Other long term (current) drug therapy: Secondary | ICD-10-CM | POA: Diagnosis not present

## 2020-09-17 DIAGNOSIS — I10 Essential (primary) hypertension: Secondary | ICD-10-CM | POA: Diagnosis not present

## 2020-09-17 DIAGNOSIS — U071 COVID-19: Secondary | ICD-10-CM | POA: Diagnosis not present

## 2020-09-17 DIAGNOSIS — Z1159 Encounter for screening for other viral diseases: Secondary | ICD-10-CM | POA: Diagnosis not present

## 2020-09-20 DIAGNOSIS — Z20828 Contact with and (suspected) exposure to other viral communicable diseases: Secondary | ICD-10-CM | POA: Diagnosis not present

## 2020-09-20 DIAGNOSIS — Z1159 Encounter for screening for other viral diseases: Secondary | ICD-10-CM | POA: Diagnosis not present

## 2020-09-24 DIAGNOSIS — F5101 Primary insomnia: Secondary | ICD-10-CM | POA: Diagnosis not present

## 2020-09-24 DIAGNOSIS — K5901 Slow transit constipation: Secondary | ICD-10-CM | POA: Diagnosis not present

## 2020-09-24 DIAGNOSIS — I10 Essential (primary) hypertension: Secondary | ICD-10-CM | POA: Diagnosis not present

## 2020-09-24 DIAGNOSIS — Z79899 Other long term (current) drug therapy: Secondary | ICD-10-CM | POA: Diagnosis not present

## 2020-09-24 DIAGNOSIS — Z20828 Contact with and (suspected) exposure to other viral communicable diseases: Secondary | ICD-10-CM | POA: Diagnosis not present

## 2020-09-24 DIAGNOSIS — Z1159 Encounter for screening for other viral diseases: Secondary | ICD-10-CM | POA: Diagnosis not present

## 2020-09-27 DIAGNOSIS — Z20828 Contact with and (suspected) exposure to other viral communicable diseases: Secondary | ICD-10-CM | POA: Diagnosis not present

## 2020-09-27 DIAGNOSIS — Z1159 Encounter for screening for other viral diseases: Secondary | ICD-10-CM | POA: Diagnosis not present

## 2020-10-01 DIAGNOSIS — Z1159 Encounter for screening for other viral diseases: Secondary | ICD-10-CM | POA: Diagnosis not present

## 2020-10-01 DIAGNOSIS — Z20828 Contact with and (suspected) exposure to other viral communicable diseases: Secondary | ICD-10-CM | POA: Diagnosis not present

## 2020-10-04 DIAGNOSIS — Z1159 Encounter for screening for other viral diseases: Secondary | ICD-10-CM | POA: Diagnosis not present

## 2020-10-04 DIAGNOSIS — I129 Hypertensive chronic kidney disease with stage 1 through stage 4 chronic kidney disease, or unspecified chronic kidney disease: Secondary | ICD-10-CM | POA: Diagnosis not present

## 2020-10-04 DIAGNOSIS — Z20828 Contact with and (suspected) exposure to other viral communicable diseases: Secondary | ICD-10-CM | POA: Diagnosis not present

## 2020-10-04 DIAGNOSIS — K5901 Slow transit constipation: Secondary | ICD-10-CM | POA: Diagnosis not present

## 2020-10-04 DIAGNOSIS — N184 Chronic kidney disease, stage 4 (severe): Secondary | ICD-10-CM | POA: Diagnosis not present

## 2020-10-04 DIAGNOSIS — M810 Age-related osteoporosis without current pathological fracture: Secondary | ICD-10-CM | POA: Diagnosis not present

## 2020-10-04 DIAGNOSIS — F319 Bipolar disorder, unspecified: Secondary | ICD-10-CM | POA: Diagnosis not present

## 2020-10-08 DIAGNOSIS — Z1159 Encounter for screening for other viral diseases: Secondary | ICD-10-CM | POA: Diagnosis not present

## 2020-10-08 DIAGNOSIS — Z20828 Contact with and (suspected) exposure to other viral communicable diseases: Secondary | ICD-10-CM | POA: Diagnosis not present

## 2020-10-11 DIAGNOSIS — Z1159 Encounter for screening for other viral diseases: Secondary | ICD-10-CM | POA: Diagnosis not present

## 2020-10-11 DIAGNOSIS — Z20828 Contact with and (suspected) exposure to other viral communicable diseases: Secondary | ICD-10-CM | POA: Diagnosis not present

## 2020-10-29 DIAGNOSIS — F319 Bipolar disorder, unspecified: Secondary | ICD-10-CM | POA: Diagnosis not present

## 2020-10-29 DIAGNOSIS — M545 Low back pain, unspecified: Secondary | ICD-10-CM | POA: Diagnosis not present

## 2020-10-29 DIAGNOSIS — I129 Hypertensive chronic kidney disease with stage 1 through stage 4 chronic kidney disease, or unspecified chronic kidney disease: Secondary | ICD-10-CM | POA: Diagnosis not present

## 2020-10-29 DIAGNOSIS — G8929 Other chronic pain: Secondary | ICD-10-CM | POA: Diagnosis not present

## 2020-10-31 DIAGNOSIS — Z8616 Personal history of COVID-19: Secondary | ICD-10-CM | POA: Diagnosis not present

## 2020-11-07 DIAGNOSIS — Z20828 Contact with and (suspected) exposure to other viral communicable diseases: Secondary | ICD-10-CM | POA: Diagnosis not present

## 2020-11-14 DIAGNOSIS — Z20828 Contact with and (suspected) exposure to other viral communicable diseases: Secondary | ICD-10-CM | POA: Diagnosis not present

## 2020-11-21 DIAGNOSIS — Z20828 Contact with and (suspected) exposure to other viral communicable diseases: Secondary | ICD-10-CM | POA: Diagnosis not present

## 2020-11-23 DIAGNOSIS — Z23 Encounter for immunization: Secondary | ICD-10-CM | POA: Diagnosis not present

## 2020-11-26 DIAGNOSIS — G8929 Other chronic pain: Secondary | ICD-10-CM | POA: Diagnosis not present

## 2020-11-26 DIAGNOSIS — F319 Bipolar disorder, unspecified: Secondary | ICD-10-CM | POA: Diagnosis not present

## 2020-11-26 DIAGNOSIS — F5101 Primary insomnia: Secondary | ICD-10-CM | POA: Diagnosis not present

## 2020-11-26 DIAGNOSIS — I129 Hypertensive chronic kidney disease with stage 1 through stage 4 chronic kidney disease, or unspecified chronic kidney disease: Secondary | ICD-10-CM | POA: Diagnosis not present

## 2020-11-28 DIAGNOSIS — Z20828 Contact with and (suspected) exposure to other viral communicable diseases: Secondary | ICD-10-CM | POA: Diagnosis not present

## 2020-12-05 DIAGNOSIS — Z20828 Contact with and (suspected) exposure to other viral communicable diseases: Secondary | ICD-10-CM | POA: Diagnosis not present

## 2020-12-05 DIAGNOSIS — N184 Chronic kidney disease, stage 4 (severe): Secondary | ICD-10-CM | POA: Diagnosis not present

## 2020-12-10 DIAGNOSIS — F319 Bipolar disorder, unspecified: Secondary | ICD-10-CM | POA: Diagnosis not present

## 2020-12-10 DIAGNOSIS — R3989 Other symptoms and signs involving the genitourinary system: Secondary | ICD-10-CM | POA: Diagnosis not present

## 2020-12-10 DIAGNOSIS — F5101 Primary insomnia: Secondary | ICD-10-CM | POA: Diagnosis not present

## 2020-12-10 DIAGNOSIS — I129 Hypertensive chronic kidney disease with stage 1 through stage 4 chronic kidney disease, or unspecified chronic kidney disease: Secondary | ICD-10-CM | POA: Diagnosis not present

## 2020-12-10 DIAGNOSIS — N898 Other specified noninflammatory disorders of vagina: Secondary | ICD-10-CM | POA: Diagnosis not present

## 2020-12-11 DIAGNOSIS — I129 Hypertensive chronic kidney disease with stage 1 through stage 4 chronic kidney disease, or unspecified chronic kidney disease: Secondary | ICD-10-CM | POA: Diagnosis not present

## 2020-12-11 DIAGNOSIS — N184 Chronic kidney disease, stage 4 (severe): Secondary | ICD-10-CM | POA: Diagnosis not present

## 2020-12-11 DIAGNOSIS — R82991 Hypocitraturia: Secondary | ICD-10-CM | POA: Diagnosis not present

## 2020-12-11 DIAGNOSIS — R8281 Pyuria: Secondary | ICD-10-CM | POA: Diagnosis not present

## 2020-12-11 DIAGNOSIS — Z8639 Personal history of other endocrine, nutritional and metabolic disease: Secondary | ICD-10-CM | POA: Diagnosis not present

## 2020-12-12 DIAGNOSIS — Z20828 Contact with and (suspected) exposure to other viral communicable diseases: Secondary | ICD-10-CM | POA: Diagnosis not present

## 2020-12-19 DIAGNOSIS — Z20828 Contact with and (suspected) exposure to other viral communicable diseases: Secondary | ICD-10-CM | POA: Diagnosis not present

## 2020-12-20 DIAGNOSIS — Z23 Encounter for immunization: Secondary | ICD-10-CM | POA: Diagnosis not present

## 2020-12-26 DIAGNOSIS — Z20828 Contact with and (suspected) exposure to other viral communicable diseases: Secondary | ICD-10-CM | POA: Diagnosis not present

## 2021-01-02 DIAGNOSIS — Z20828 Contact with and (suspected) exposure to other viral communicable diseases: Secondary | ICD-10-CM | POA: Diagnosis not present

## 2021-01-07 DIAGNOSIS — F319 Bipolar disorder, unspecified: Secondary | ICD-10-CM | POA: Diagnosis not present

## 2021-01-07 DIAGNOSIS — Z79899 Other long term (current) drug therapy: Secondary | ICD-10-CM | POA: Diagnosis not present

## 2021-01-07 DIAGNOSIS — I129 Hypertensive chronic kidney disease with stage 1 through stage 4 chronic kidney disease, or unspecified chronic kidney disease: Secondary | ICD-10-CM | POA: Diagnosis not present

## 2021-01-07 DIAGNOSIS — M159 Polyosteoarthritis, unspecified: Secondary | ICD-10-CM | POA: Diagnosis not present

## 2021-01-07 DIAGNOSIS — K219 Gastro-esophageal reflux disease without esophagitis: Secondary | ICD-10-CM | POA: Diagnosis not present

## 2021-01-09 DIAGNOSIS — Z20822 Contact with and (suspected) exposure to covid-19: Secondary | ICD-10-CM | POA: Diagnosis not present

## 2021-01-16 DIAGNOSIS — Z20828 Contact with and (suspected) exposure to other viral communicable diseases: Secondary | ICD-10-CM | POA: Diagnosis not present

## 2021-01-23 DIAGNOSIS — Z8616 Personal history of COVID-19: Secondary | ICD-10-CM | POA: Diagnosis not present

## 2021-01-28 DIAGNOSIS — N771 Vaginitis, vulvitis and vulvovaginitis in diseases classified elsewhere: Secondary | ICD-10-CM | POA: Diagnosis not present

## 2021-01-30 DIAGNOSIS — Z20828 Contact with and (suspected) exposure to other viral communicable diseases: Secondary | ICD-10-CM | POA: Diagnosis not present

## 2021-02-04 DIAGNOSIS — Z20828 Contact with and (suspected) exposure to other viral communicable diseases: Secondary | ICD-10-CM | POA: Diagnosis not present

## 2021-02-04 DIAGNOSIS — N771 Vaginitis, vulvitis and vulvovaginitis in diseases classified elsewhere: Secondary | ICD-10-CM | POA: Diagnosis not present

## 2021-02-04 DIAGNOSIS — Z1159 Encounter for screening for other viral diseases: Secondary | ICD-10-CM | POA: Diagnosis not present

## 2021-02-11 DIAGNOSIS — F3112 Bipolar disorder, current episode manic without psychotic features, moderate: Secondary | ICD-10-CM | POA: Diagnosis not present

## 2021-02-11 DIAGNOSIS — B351 Tinea unguium: Secondary | ICD-10-CM | POA: Diagnosis not present

## 2021-02-11 DIAGNOSIS — Z79899 Other long term (current) drug therapy: Secondary | ICD-10-CM | POA: Diagnosis not present

## 2021-02-11 DIAGNOSIS — M2141 Flat foot [pes planus] (acquired), right foot: Secondary | ICD-10-CM | POA: Diagnosis not present

## 2021-02-11 DIAGNOSIS — N189 Chronic kidney disease, unspecified: Secondary | ICD-10-CM | POA: Diagnosis not present

## 2021-02-11 DIAGNOSIS — I129 Hypertensive chronic kidney disease with stage 1 through stage 4 chronic kidney disease, or unspecified chronic kidney disease: Secondary | ICD-10-CM | POA: Diagnosis not present

## 2021-02-11 DIAGNOSIS — Z20828 Contact with and (suspected) exposure to other viral communicable diseases: Secondary | ICD-10-CM | POA: Diagnosis not present

## 2021-02-11 DIAGNOSIS — N3000 Acute cystitis without hematuria: Secondary | ICD-10-CM | POA: Diagnosis not present

## 2021-02-11 DIAGNOSIS — Z1159 Encounter for screening for other viral diseases: Secondary | ICD-10-CM | POA: Diagnosis not present

## 2021-02-18 DIAGNOSIS — Z20828 Contact with and (suspected) exposure to other viral communicable diseases: Secondary | ICD-10-CM | POA: Diagnosis not present

## 2021-02-18 DIAGNOSIS — Z1159 Encounter for screening for other viral diseases: Secondary | ICD-10-CM | POA: Diagnosis not present

## 2021-02-20 DIAGNOSIS — Z1159 Encounter for screening for other viral diseases: Secondary | ICD-10-CM | POA: Diagnosis not present

## 2021-02-20 DIAGNOSIS — Z20828 Contact with and (suspected) exposure to other viral communicable diseases: Secondary | ICD-10-CM | POA: Diagnosis not present

## 2021-02-22 DIAGNOSIS — Z20828 Contact with and (suspected) exposure to other viral communicable diseases: Secondary | ICD-10-CM | POA: Diagnosis not present

## 2021-02-22 DIAGNOSIS — Z1159 Encounter for screening for other viral diseases: Secondary | ICD-10-CM | POA: Diagnosis not present

## 2021-02-25 DIAGNOSIS — N905 Atrophy of vulva: Secondary | ICD-10-CM | POA: Diagnosis not present

## 2021-02-25 DIAGNOSIS — I129 Hypertensive chronic kidney disease with stage 1 through stage 4 chronic kidney disease, or unspecified chronic kidney disease: Secondary | ICD-10-CM | POA: Diagnosis not present

## 2021-02-25 DIAGNOSIS — Z20828 Contact with and (suspected) exposure to other viral communicable diseases: Secondary | ICD-10-CM | POA: Diagnosis not present

## 2021-02-25 DIAGNOSIS — F3112 Bipolar disorder, current episode manic without psychotic features, moderate: Secondary | ICD-10-CM | POA: Diagnosis not present

## 2021-02-25 DIAGNOSIS — Z1159 Encounter for screening for other viral diseases: Secondary | ICD-10-CM | POA: Diagnosis not present

## 2021-02-25 DIAGNOSIS — N1832 Chronic kidney disease, stage 3b: Secondary | ICD-10-CM | POA: Diagnosis not present

## 2021-02-27 DIAGNOSIS — Z1159 Encounter for screening for other viral diseases: Secondary | ICD-10-CM | POA: Diagnosis not present

## 2021-02-27 DIAGNOSIS — Z20828 Contact with and (suspected) exposure to other viral communicable diseases: Secondary | ICD-10-CM | POA: Diagnosis not present

## 2021-03-04 DIAGNOSIS — Z1159 Encounter for screening for other viral diseases: Secondary | ICD-10-CM | POA: Diagnosis not present

## 2021-03-04 DIAGNOSIS — Z20828 Contact with and (suspected) exposure to other viral communicable diseases: Secondary | ICD-10-CM | POA: Diagnosis not present

## 2021-03-06 DIAGNOSIS — Z1159 Encounter for screening for other viral diseases: Secondary | ICD-10-CM | POA: Diagnosis not present

## 2021-03-06 DIAGNOSIS — Z20828 Contact with and (suspected) exposure to other viral communicable diseases: Secondary | ICD-10-CM | POA: Diagnosis not present

## 2021-03-07 DIAGNOSIS — I129 Hypertensive chronic kidney disease with stage 1 through stage 4 chronic kidney disease, or unspecified chronic kidney disease: Secondary | ICD-10-CM | POA: Diagnosis not present

## 2021-03-07 DIAGNOSIS — F3112 Bipolar disorder, current episode manic without psychotic features, moderate: Secondary | ICD-10-CM | POA: Diagnosis not present

## 2021-03-07 DIAGNOSIS — N1832 Chronic kidney disease, stage 3b: Secondary | ICD-10-CM | POA: Diagnosis not present

## 2021-03-07 DIAGNOSIS — K5901 Slow transit constipation: Secondary | ICD-10-CM | POA: Diagnosis not present

## 2021-06-15 ENCOUNTER — Encounter (HOSPITAL_BASED_OUTPATIENT_CLINIC_OR_DEPARTMENT_OTHER): Payer: Self-pay | Admitting: Emergency Medicine

## 2021-06-15 ENCOUNTER — Emergency Department (HOSPITAL_BASED_OUTPATIENT_CLINIC_OR_DEPARTMENT_OTHER): Payer: Medicare Other | Admitting: Radiology

## 2021-06-15 ENCOUNTER — Emergency Department (HOSPITAL_BASED_OUTPATIENT_CLINIC_OR_DEPARTMENT_OTHER)
Admission: EM | Admit: 2021-06-15 | Discharge: 2021-06-15 | Disposition: A | Discharge status: Home / Self Care | Payer: Medicare Other | Attending: Emergency Medicine | Admitting: Emergency Medicine

## 2021-06-15 ENCOUNTER — Other Ambulatory Visit: Payer: Self-pay

## 2021-06-15 DIAGNOSIS — R059 Cough, unspecified: Secondary | ICD-10-CM | POA: Insufficient documentation

## 2021-06-15 DIAGNOSIS — R062 Wheezing: Secondary | ICD-10-CM | POA: Diagnosis not present

## 2021-06-15 DIAGNOSIS — Z20822 Contact with and (suspected) exposure to covid-19: Secondary | ICD-10-CM | POA: Insufficient documentation

## 2021-06-15 DIAGNOSIS — R0602 Shortness of breath: Secondary | ICD-10-CM | POA: Insufficient documentation

## 2021-06-15 HISTORY — DX: Essential (primary) hypertension: I10

## 2021-06-15 LAB — RESP PANEL BY RT-PCR (FLU A&B, COVID) ARPGX2
Influenza A by PCR: NEGATIVE
Influenza B by PCR: NEGATIVE
SARS Coronavirus 2 by RT PCR: NEGATIVE

## 2021-06-15 MED ORDER — PREDNISONE 50 MG PO TABS
60.0000 mg | ORAL_TABLET | Freq: Once | ORAL | Status: AC
  Administered 2021-06-15: 60 mg via ORAL
  Filled 2021-06-15: qty 1

## 2021-06-15 MED ORDER — IPRATROPIUM-ALBUTEROL 0.5-2.5 (3) MG/3ML IN SOLN
3.0000 mL | Freq: Once | RESPIRATORY_TRACT | Status: AC
  Administered 2021-06-15: 3 mL via RESPIRATORY_TRACT
  Filled 2021-06-15: qty 3

## 2021-06-15 MED ORDER — PREDNISONE 10 MG PO TABS: 40.0000 mg | ORAL_TABLET | Freq: Every day | ORAL | 0 refills | Status: AC

## 2021-06-15 MED ORDER — DOXYCYCLINE HYCLATE 100 MG PO CAPS: 100.0000 mg | ORAL_CAPSULE | Freq: Two times a day (BID) | ORAL | 0 refills | Status: AC

## 2021-06-15 MED ORDER — ALBUTEROL SULFATE HFA 108 (90 BASE) MCG/ACT IN AERS
2.0000 | INHALATION_SPRAY | RESPIRATORY_TRACT | Status: DC | PRN
  Administered 2021-06-15: 2 via RESPIRATORY_TRACT
  Filled 2021-06-15: qty 6.7

## 2021-06-15 MED ORDER — ALBUTEROL SULFATE HFA 108 (90 BASE) MCG/ACT IN AERS: 2.0000 | INHALATION_SPRAY | RESPIRATORY_TRACT | 2 refills | Status: DC | PRN

## 2021-06-15 MED ORDER — DOXYCYCLINE HYCLATE 100 MG PO TABS
100.0000 mg | ORAL_TABLET | Freq: Once | ORAL | Status: AC
  Administered 2021-06-15: 100 mg via ORAL
  Filled 2021-06-15: qty 1

## 2021-06-15 NOTE — Discharge Instructions (Addendum)
?  We are treating for possible pneumonia. There was not any clear sign of this on an x-ray.  However, I thought it was reasonable given that you have crackles in the right lower lung, and you have been having this cough for a week.  We also started you on prednisone for the next 4 days which is a steroid medicine that will help with the wheezing that we are reviewing today.  We gave you breathing treatment here in the ER and prescribed albuterol pump which you can use as needed for the next 6 to 7 days. ? ?Your next dose of doxycycline antibiotic is due tonight at bedtime.  Your next dose of prednisone (steroids) is due tomorrow morning with breakfast. ?

## 2021-06-15 NOTE — ED Triage Notes (Signed)
Pt lives at [facility] assisted [facility]. She has had cough for a week, used mucinex with no improvement. Daughter states she has wheezing.  ?

## 2021-06-15 NOTE — ED Notes (Signed)
RT Note: Pt. ambulated to ED with assistance, able to provide Urine sample, labelled in room if needed. ?

## 2021-06-15 NOTE — ED Provider Notes (Signed)
?[facility] EMERGENCY DEPT ?Provider Note ? ? ?CSN: [id number] ?Arrival date & time: 06/15/21  1202 ? ?  ? ?History ? ?Chief Complaint  ?Patient presents with  ? Cough  ? ? ?[name] is a 81 y.o. female presenting to the emergency department with cough and labored breathing.  Her daughter at bedside provides supplemental history.  They report she has had symptoms for approximately 6 days, coming from [facility], was treated with Mucinex and cough syrup but no improvement.  She does not have any underlying pulmonary history per her report or her daughter's report, denies history of asthma, COPD, smoking, pneumonia in the past, or congestive heart failure. ? ?HPI ? ?  ? ?Home Medications ?Prior to Admission medications   ?Medication Sig Start Date End Date Taking? Authorizing Provider  ?albuterol (VENTOLIN HFA) 108 (90 Base) MCG/ACT inhaler Inhale 2 puffs into the lungs every 4 (four) hours as needed for wheezing or shortness of breath. 06/15/21  Yes Juell Radney, Carola Rhine, MD  ?doxycycline (VIBRAMYCIN) 100 MG capsule Take 1 capsule (100 mg total) by mouth 2 (two) times daily for 7 days. 06/15/21 06/22/21 Yes Santresa Levett, Carola Rhine, MD  ?predniSONE (DELTASONE) 10 MG tablet Take 4 tablets (40 mg total) by mouth daily with breakfast for 4 days. 06/16/21 06/20/21 Yes Lafern Brinkley, Carola Rhine, MD  ?acetaminophen (TYLENOL) 325 MG tablet Take 650 mg by mouth every 6 (six) hours as needed. 02/01/20   [provider]  ?alendronate (FOSAMAX) 70 MG tablet Take 70 mg by mouth every Wednesday. Take with a full glass of water on an empty stomach.    [provider]  ?amLODipine (NORVASC) 5 MG tablet Take 5 mg by mouth at bedtime. 12/19/19   [provider]  ?Cholecalciferol (VITAMIN D-3 PO) Take 1,000 Units by mouth daily.  01/24/20   [provider]  ?Estradiol (VAGIFEM) 10 MCG TABS vaginal tablet Place 10 mcg vaginally once a week.     [provider]  ?lamoTRIgine (LAMICTAL) 25 MG tablet  Take 25 mg by mouth at bedtime.    [provider]  ?NON FORMULARY 2L fluid restriction in 24 hour period. Document total intake from all sources qshift. ?7-3 = 859m ?3-11 = 8564m?11-7 = 30019mEvery Shift ?Day, Evening, Night 01/24/20   [provider]  ?NON FORMULARY Diet: NAS with 2L fluid restriction in 24 hours. 01/24/20   [provider]  ?polyethylene glycol (MIRALAX / GLYCOLAX) 17 g packet Take 17 g by mouth daily. 01/25/20   GooSamuella CotaD  ?propranolol (INDERAL) 20 MG tablet Take 20 mg by mouth daily. 01/06/14   [provider]  ?rosuvastatin (CRESTOR) 10 MG tablet Take 10 mg by mouth at bedtime.    [provider]  ?senna (SENOKOT) 8.6 MG TABS tablet Take 1 tablet (8.6 mg total) by mouth at bedtime. 01/24/20   GooSamuella CotaD  ?tamsulosin (FLOMAX) 0.4 MG CAPS capsule Take 1 capsule (0.4 mg total) by mouth daily. 04/24/20   GreCorena HerterA-C  ?traZODone (DESYREL) 50 MG tablet Take 1 tablet (50 mg total) by mouth at bedtime. 01/24/20   GooSamuella CotaD  ?   ? ?Allergies    ?Amoxicillin, Clindamycin/lincomycin, Morphine and related, Other, Oxycodone, and Sertraline hcl   ? ?Review of Systems   ?Review of Systems ? ?Physical Exam ?Updated Vital Signs ?BP (!) 143/89 (BP Location: Right Arm)   Pulse 72   Temp 97.7 ?F (36.5 ?C)  Resp 19   SpO2 96%  ?Physical Exam ?Constitutional:   ?   General: She is not in acute distress. ?HENT:  ?   Head: Normocephalic and atraumatic.  ?Eyes:  ?   Conjunctiva/sclera: Conjunctivae normal.  ?   Pupils: Pupils are equal, round, and reactive to light.  ?Cardiovascular:  ?   Rate and Rhythm: Normal rate and regular rhythm.  ?Pulmonary:  ?   Effort: Pulmonary effort is normal. No respiratory distress.  ?   Comments: Expiratory wheezing more pronounced in the left lung fields, crackles in the right lower lobe ?Skin: ?   General: Skin is warm and dry.  ?Neurological:  ?   General: No focal deficit present.   ?   Mental Status: She is alert. Mental status is at baseline.  ?Psychiatric:     ?   Mood and Affect: Mood normal.     ?   Behavior: Behavior normal.  ? ? ?ED Results / Procedures / Treatments   ?Labs ?(all labs ordered are listed, but only abnormal results are displayed) ?Labs Reviewed  ?RESP PANEL BY RT-PCR (FLU A&B, COVID) ARPGX2  ? ? ?EKG ?None ? ?Radiology ?DG Chest Port 1 View ? ?Result Date: 06/15/2021 ?CLINICAL DATA:  Cough and congestion. EXAM: PORTABLE CHEST 1 VIEW COMPARISON:  01/18/2020. FINDINGS: Cardiac silhouette normal in size.  No mediastinal or hilar masses. Prominent interstitial markings similar to the prior exams. No evidence of pneumonia or pulmonary edema. No pleural effusion or pneumothorax. Skeletal structures are grossly intact. IMPRESSION: No acute cardiopulmonary disease. Electronically Signed   By: Lajean Manes M.D.   On: 06/15/2021 13:45   ? ?Procedures ?Procedures  ? ? ?Medications Ordered in ED ?Medications  ?albuterol (VENTOLIN HFA) 108 (90 Base) MCG/ACT inhaler 2 puff (2 puffs Inhalation Given 06/15/21 1408)  ?doxycycline (VIBRA-TABS) tablet 100 mg (100 mg Oral Given 06/15/21 1607)  ?ipratropium-albuterol (DUONEB) 0.5-2.5 (3) MG/3ML nebulizer solution 3 mL (3 mLs Nebulization Given 06/15/21 1543)  ?predniSONE (DELTASONE) tablet 60 mg (60 mg Oral Given 06/15/21 1607)  ? ? ?ED Course/ Medical Decision Making/ A&P ?  ?                        ?Medical Decision Making ?Risk ?Prescription drug management. ? ? ?Patient is here with cough and wheezing for 6 days.  She has audible wheezing on exam, no underlying known history of reactive airway disease.  Differential would include bronchitis versus pneumonia versus viral upper respiratory infection versus new onset COPD versus other.  Overall she is clinically well-appearing, not in acute respiratory distress, not requiring BiPAP, not requiring oxygen.  She is speaking comfortably in full sentences.  I personally reviewed her x-ray do not see any  other infiltrate, but this is a limited single view, she does have audible crackles in the right lower lobe and so I think is reasonable to treat for possible community pneumonia.  Will start on doxycycline twice daily for 7 days.  We will also give a DuoNeb treatment here.  I will start on prednisone as well.  She can be discharged home in the company of her daughter.  Both of them are content with this plan. ? ?COVID and flu test were negative per my review. ? ?I have a low suspicion time for acute PE, sepsis, new onset congestive heart failure, pneumothorax, or other life-threatening condition. ? ? ? ? ? ? ? ?Final Clinical Impression(s) / ED Diagnoses ?Final diagnoses:  ?  Shortness of breath  ? ? ?Rx / DC Orders ?ED Discharge Orders   ? ?      Ordered  ?  predniSONE (DELTASONE) 10 MG tablet  Daily with breakfast       ? 06/15/21 1528  ?  doxycycline (VIBRAMYCIN) 100 MG capsule  2 times daily       ? 06/15/21 1528  ?  albuterol (VENTOLIN HFA) 108 (90 Base) MCG/ACT inhaler  Every 4 hours PRN       ? 06/15/21 1528  ? ?  ?  ? ?  ? ? ?  ?Wyvonnia Dusky, MD ?06/15/21 1922 ? ?

## 2022-01-08 ENCOUNTER — Encounter: Payer: Self-pay | Admitting: Internal Medicine

## 2022-01-30 ENCOUNTER — Emergency Department (HOSPITAL_BASED_OUTPATIENT_CLINIC_OR_DEPARTMENT_OTHER)
Admission: EM | Admit: 2022-01-30 | Discharge: 2022-01-30 | Disposition: A | Discharge status: Home / Self Care | Payer: Medicare Other | Attending: Emergency Medicine | Admitting: Emergency Medicine

## 2022-01-30 ENCOUNTER — Other Ambulatory Visit: Payer: Self-pay

## 2022-01-30 ENCOUNTER — Emergency Department (HOSPITAL_BASED_OUTPATIENT_CLINIC_OR_DEPARTMENT_OTHER): Payer: Medicare Other | Admitting: Radiology

## 2022-01-30 DIAGNOSIS — I129 Hypertensive chronic kidney disease with stage 1 through stage 4 chronic kidney disease, or unspecified chronic kidney disease: Secondary | ICD-10-CM | POA: Diagnosis not present

## 2022-01-30 DIAGNOSIS — N183 Chronic kidney disease, stage 3 unspecified: Secondary | ICD-10-CM | POA: Insufficient documentation

## 2022-01-30 DIAGNOSIS — Z79899 Other long term (current) drug therapy: Secondary | ICD-10-CM | POA: Insufficient documentation

## 2022-01-30 DIAGNOSIS — R0989 Other specified symptoms and signs involving the circulatory and respiratory systems: Secondary | ICD-10-CM | POA: Diagnosis present

## 2022-01-30 NOTE — ED Provider Notes (Signed)
[facility] EMERGENCY DEPT Provider Note   CSN: [id number] Arrival date & time: 01/30/22  1458     History  Chief Complaint  Patient presents with   Emesis    [name] is a 81 y.o. female with CKD stage 3, constipation, HTN, HLD, bipolar disease presents with choking.  Patient was eating lunch out at a restaurant with her family when she experienced a choking episode. She did not require the Heimlich maneuver but was able to cough up the food on her own.  She has vomited a couple of times since then and has also coughed up some mucus with food and it.  She has not had any coughing or vomiting since being in the emergency department.  She denies any chest pain or shortness of breath, she states she feels normal now.  The son at bedside states that she has trouble swallowing and has been told in the past that she should probably have a barium swallow to evaluate for dysphagia.  Patient [place].   Emesis      Home Medications Prior to Admission medications   Medication Sig Start Date End Date Taking? Authorizing Provider  acetaminophen (TYLENOL) 325 MG tablet Take 650 mg by mouth every 6 (six) hours as needed. 02/01/20   [provider]  albuterol (VENTOLIN HFA) 108 (90 Base) MCG/ACT inhaler Inhale 2 puffs into the lungs every 4 (four) hours as needed for wheezing or shortness of breath. 06/15/21   Wyvonnia Dusky, MD  alendronate (FOSAMAX) 70 MG tablet Take 70 mg by mouth every Wednesday. Take with a full glass of water on an empty stomach.    [provider]  amLODipine (NORVASC) 5 MG tablet Take 5 mg by mouth at bedtime. 12/19/19   [provider]  Cholecalciferol (VITAMIN D-3 PO) Take 1,000 Units by mouth daily.  01/24/20   [provider]  Estradiol (VAGIFEM) 10 MCG TABS vaginal tablet Place 10 mcg vaginally once a week.     [provider]  lamoTRIgine (LAMICTAL) 25 MG tablet Take 25 mg by  mouth at bedtime.    [provider]  NON FORMULARY 2L fluid restriction in 24 hour period. Document total intake from all sources qshift. 7-3 = 846m 3-11 = 8527m11-7 = 30065mvery Shift Day, Evening, Night 01/24/20   [provider]  NON FORMULARY Diet: NAS with 2L fluid restriction in 24 hours. 01/24/20   [provider]  polyethylene glycol (MIRALAX / GLYCOLAX) 17 g packet Take 17 g by mouth daily. 01/25/20   GooSamuella CotaD  propranolol (INDERAL) 20 MG tablet Take 20 mg by mouth daily. 01/06/14   [provider]  rosuvastatin (CRESTOR) 10 MG tablet Take 10 mg by mouth at bedtime.    [provider]  senna (SENOKOT) 8.6 MG TABS tablet Take 1 tablet (8.6 mg total) by mouth at bedtime. 01/24/20   GooSamuella CotaD  tamsulosin (FLOMAX) 0.4 MG CAPS capsule Take 1 capsule (0.4 mg total) by mouth daily. 04/24/20   GreCorena HerterA-C  traZODone (DESYREL) 50 MG tablet Take 1 tablet (50 mg total) by mouth at bedtime. 01/24/20   GooSamuella CotaD      Allergies    Amoxicillin, Clindamycin/lincomycin, Morphine and related, Other, Oxycodone, and Sertraline hcl    Review of Systems   Review of Systems  Gastrointestinal:  Positive for vomiting.   Review of systems negative for chest pain,  shortness of breath, fever/chills.  A 10 point review of systems was performed and is negative unless otherwise reported in HPI.  Physical Exam Updated Vital Signs BP (!) 164/98 (BP Location: Right Arm)   Pulse 74   Temp 97.6 F (36.4 C) (Oral)   Resp 18   Ht '5\' 6"'$  (1.676 m)   Wt 69.9 kg   SpO2 100%   BMI 24.86 kg/m  Physical Exam General: Normal appearing elderly female, lying in bed.  HEENT: PERRLA, Sclera anicteric, MMM, trachea midline. Cardiology: RRR, no murmurs/rubs/gallops. BL radial and DP pulses equal bilaterally.  Resp: Normal respiratory rate and effort. CTAB, no wheezes, rhonchi, crackles.  Abd: Soft, non-tender,  non-distended. No rebound tenderness or guarding.  GU: Deferred. MSK: No peripheral edema or signs of trauma. Extremities without deformity or TTP. No cyanosis or clubbing. Skin: warm, dry. No rashes or lesions. Back: No CVA tenderness Neuro: A&Ox4, CNs II-XII grossly intact. MAEs. Sensation grossly intact.  Psych: Normal mood and affect.   ED Results / Procedures / Treatments    Radiology DG Chest 2 View  Result Date: 01/30/2022 CLINICAL DATA:  Choking, aspiration. EXAM: CHEST - 2 VIEW COMPARISON:  None Available. FINDINGS: The heart size and mediastinal contours are within normal limits. Moderate size hiatal hernia. Both lungs are clear. Marked thoracic kyphoscoliosis with wedge-shaped deformities at the thoracolumbar junction. Osteopenia. IMPRESSION: 1. No active cardiopulmonary disease. 2. Moderate size hiatal hernia. 3. Marked thoracic kyphoscoliosis. Electronically Signed   By: Keane Police D.O.   On: 01/30/2022 16:15    Procedures Procedures    Medications Ordered in ED Medications - No data to display  ED Course/ Medical Decision Making/ A&P                          Medical Decision Making Amount and/or Complexity of Data Reviewed Radiology: ordered. Decision-making details documented in ED Course.    Patient is overall very well-appearing.  She is not tachypneic, not coughing, no hypoxia.  Otherwise hemodynamically stable.  Patient had an aspiration/choking event today.  Based on coughing up mucus/food, it does seem as though she had an aspiration event.  However, at the current time she is in no respiratory distress, no hypoxia, no coughing, no tachypnea.  She feels well and appears normal, and has for her 2-1/2-hour stay in the emergency department.  Obtained a chest x-ray to evaluate for aspirated foreign body or atelectasis and there is none visible on the x-ray.  I discussed at length with patient and her son at bedside about dysphagia and aspiration precautions.  I  discussed with them that they should call her gastroenterologist in the morning which the patient has to schedule and a barium swallow and evaluation for dysphagia.  I discussed aspiration precautions including sitting upright with eating, small meals and small bites, focusing on chewing and swallowing without distractions.  I discussed that dry dense meats such as [place], as today is Thanksgiving, may be difficult for the patient to swallow and she should avoid those.  Patient and son both report understanding, all questions answered to their satisfaction.  I considered admission for this patient but do not believe it is necessary at this time.  Patient will be discharged with discharge instructions and return precautions.  Dispo: DC in stable condition    I have personally reviewed and interpreted all imaging.   Clinical Course as of 01/30/22 1735  Thu Jan 30, 2022  1702 DG  Chest 2 View FINDINGS: The heart size and mediastinal contours are within normal limits. Moderate size hiatal hernia. Both lungs are clear. Marked thoracic kyphoscoliosis with wedge-shaped deformities at the thoracolumbar junction. Osteopenia.  IMPRESSION: 1. No active cardiopulmonary disease. 2. Moderate size hiatal hernia. 3. Marked thoracic kyphoscoliosis. [HN]    Clinical Course User Index [HN] Audley Hose, MD          Final Clinical Impression(s) / ED Diagnoses Final diagnoses:  Choking episode    Rx / DC Orders ED Discharge Orders     None        This note was created using dictation software, which may contain spelling or grammatical errors.    Audley Hose, MD 01/30/22 [phone]

## 2022-01-30 NOTE — ED Triage Notes (Signed)
Pt presents, family reports that patient got choked on some food at lunch today and has had several episodes of emesis since.  Family reports this has happened in the past and that patient may need swallow eval.

## 2022-01-30 NOTE — Discharge Instructions (Addendum)
Thank you for coming to [facility] Emergency Department. You were seen for a choking or aspiration episode. We did an exam, and imaging, and these showed  no acute findings.  It is possible that you aspirated food today, but your chest xray was clear and did not show any large pieces of impacted food in the lungs. You may have dysphagia, or difficulty swallowing, that may put you at increased risk for aspiration events and choking. Please call your gastroenterologist in the AM to schedule a barium swallow or other testing. Attached are some recommendations for dysphagia and precautions to take against aspiration. Please follow up with your primary care provider within 1 week.   Do not hesitate to return to the ED or call 911 if you experience: -Worsening symptoms -Shortness of breath -Lightheadedness, passing out -Fevers/chills -Anything else that concerns you

## 2022-02-17 ENCOUNTER — Ambulatory Visit: Payer: Medicare Other | Admitting: Gastroenterology

## 2022-03-20 ENCOUNTER — Encounter: Payer: Self-pay | Admitting: Internal Medicine

## 2022-04-14 ENCOUNTER — Ambulatory Visit (INDEPENDENT_AMBULATORY_CARE_PROVIDER_SITE_OTHER): Payer: Medicare Other | Admitting: Internal Medicine

## 2022-04-14 ENCOUNTER — Encounter: Payer: Self-pay | Admitting: Internal Medicine

## 2022-04-14 VITALS — BP 150/88 | HR 65 | Ht 66.0 in | Wt 146.4 lb

## 2022-04-14 DIAGNOSIS — K219 Gastro-esophageal reflux disease without esophagitis: Secondary | ICD-10-CM

## 2022-04-14 DIAGNOSIS — R1310 Dysphagia, unspecified: Secondary | ICD-10-CM

## 2022-04-14 NOTE — Patient Instructions (Signed)
_______________________________________________________  If your blood pressure at your visit was 140/90 or greater, please contact your primary care physician to follow up on this.  _______________________________________________________  If you are age 82 or older, your body mass index should be between 23-30. Your Body mass index is 23.63 kg/m. If this is out of the aforementioned range listed, please consider follow up with your Primary Care Provider.  If you are age 45 or younger, your body mass index should be between 19-25. Your Body mass index is 23.63 kg/m. If this is out of the aformentioned range listed, please consider follow up with your Primary Care Provider.   ________________________________________________________  The [facility] providers would like to encourage you to use [facility] to communicate with providers for non-urgent requests or questions.  Due to long hold times on the telephone, sending your provider a message by [facility] may be a faster and more efficient way to get a response.  Please allow 48 business hours for a response.  Please remember that this is for non-urgent requests.  _______________________________________________________  [name] have been scheduled for an endoscopy. Please follow written instructions given to you at your visit today. If you use inhalers (even only as needed), please bring them with you on the day of your procedure.  Thank you for entrusting me with your care and choosing [facility].  Dr Henrene Pastor

## 2022-04-14 NOTE — Addendum Note (Signed)
Addended by: Debbe Mounts on: 04/14/2022 04:24 PM   Modules accepted: Orders

## 2022-04-14 NOTE — Progress Notes (Signed)
HISTORY OF PRESENT ILLNESS:  [name] is a very pleasant hard of hearing 82 y.o. female, resident of [facility] [facility] and mother of [name], who presents today regarding problems with intermittent dysphagia and what sounds like transient food impaction.  She is accompanied by her daughter.  Previous GI care with Dr. Gala Romney including colonoscopy in 2017 (reviewed).  The current history is that of intermittent solid food dysphagia with transient food impactions, severe at times.  1 severe episode of Thanksgiving led to her being seen emergency room.  Did not require endoscopic attention as this spontaneously passed.  She was noted to have kyphosis and a hiatal hernia on imaging.  She has had other less severe episodes over time.  She reports that she needs to cut her food up in small pieces.  She did have an episode eating [employer].  She does have a history of chronic GERD which is seemingly better and more recent years.  Not on medication for GERD.  Her weight has been stable.  CT scan of the abdomen and pelvis November 2021 revealed fecal impaction.  Colonoscopy May 2017 with diverticulosis and a diminutive polyp.  Blood work from PCP February 10, 2022 with normal CBC including hemoglobin 12.0.  Creatinine 1.62.  Otherwise normal comprehensive metabolic panel.  She is not on blood thinners.  She does take Senokot for constipation.  Normal EF on Lexiscan stress testing 2019.  REVIEW OF SYSTEMS:  All non-GI ROS negative unless otherwise stated in the HPI except for anxiety, arthritis, back pain, cough, fatigue, hearing problems, shortness of breath, excessive thirst, excessive urination  Past Medical History:  Diagnosis Date   Bipolar affective disorder ([place])    CKD (chronic kidney disease)    Depression    Diverticulosis of colon 07/29/2007   Hypertension    Hypotension    Tubular adenoma of colon 07/29/2007   Dr. Faith Rogue colonoscopy, previous focally adenomatous cecal polyp 2006     Past Surgical History:  Procedure Laterality Date   COLONOSCOPY  2012   Dr. Gala Romney: Tubular adenoma, diverticulosis.   COLONOSCOPY N/A 08/01/2015   Procedure: COLONOSCOPY;  Surgeon: Daneil Dolin, MD;  Location[facility];  Service: Endoscopy;  Laterality: N/A;  1610   KNEE ARTHROSCOPY     spinal tumor removal     TOTAL KNEE ARTHROPLASTY      Social History TIFFANE SHELDON  reports that she has quit smoking. Her smoking use included cigarettes. She has never used smokeless tobacco. She reports that she does not drink alcohol and does not use drugs.  family history includes Colitis in an other family member; Kidney disease in her mother; Pancreatic cancer in her daughter.  Allergies  Allergen Reactions   Amoxicillin Nausea And Vomiting   Clindamycin/Lincomycin Nausea And Vomiting   Morphine And Related Nausea And Vomiting   Other     Any pain medications. "i have trouble with them".    Oxycodone Nausea Only   Sertraline Hcl     Pt's daughter reported (01/19/20) says "it sends her in to mania"       PHYSICAL EXAMINATION: Vital signs: BP (!) 150/88   Pulse 65   Ht '5\' 6"'$  (1.676 m)   Wt 146 lb 6 oz (66.4 kg)   SpO2 97%   BMI 23.63 kg/m   Constitutional: Kyphotic hard of hearing but generally well-appearing elderly female, no acute distress Psychiatric: alert and oriented x3, cooperative Eyes: extraocular movements intact, anicteric, conjunctiva pink Mouth: oral pharynx  moist, no lesions Neck: With restricted mobility.  No lymphadenopathy Cardiovascular: heart regular rate and rhythm, no murmur Lungs: clear to auscultation bilaterally Abdomen: soft, nontender, nondistended, no obvious ascites, no peritoneal signs, normal bowel sounds, no organomegaly Rectal: Omitted Extremities: no clubbing, cyanosis, or lower extremity edema bilaterally Skin: no lesions on visible extremities Neuro: No focal deficits.  Cranial nerves intact  ASSESSMENT:  1.  Intermittent solid  food dysphagia with transient food impaction.  Rule out peptic stricture 2.  History of chronic GERD.  Currently without significant symptoms.  On no medical therapy 3.  Hiatal hernia on imaging 4.  Previous colonoscopies elsewhere.  Aged out of surveillance 5.  Functional constipation   PLAN:  1.  Upper endoscopy with probable esophageal dilation.The nature of the procedure, as well as the risks, benefits, and alternatives were carefully and thoroughly reviewed with the patient. Ample time for discussion and questions allowed. The patient understood, was satisfied, and agreed to proceed. 2.  May need PPI.  Discussed 3.  Continue with laxative of choice for constipation 4.  Resume general medical care with PCP and other specialists A total time of 60 minutes was spent preparing to see the patient, reviewing the emergency room evaluation, x-rays, endoscopy reports, laboratories, and pathology.  Obtaining comprehensive history, performing medically appropriate physical examination, counseling and educating the patient as well as her daughter regarding the above listed issues, answering multiple questions, ordering advanced endoscopic procedure, and documenting clinical information in the health record

## 2022-04-22 ENCOUNTER — Encounter: Payer: Self-pay | Admitting: Internal Medicine

## 2022-04-22 ENCOUNTER — Ambulatory Visit (AMBULATORY_SURGERY_CENTER): Payer: Medicare Other | Admitting: Internal Medicine

## 2022-04-22 VITALS — BP 150/77 | HR 78 | Temp 98.2°F | Resp 23 | Ht 66.0 in | Wt 146.0 lb

## 2022-04-22 DIAGNOSIS — K222 Esophageal obstruction: Secondary | ICD-10-CM | POA: Diagnosis not present

## 2022-04-22 DIAGNOSIS — K219 Gastro-esophageal reflux disease without esophagitis: Secondary | ICD-10-CM

## 2022-04-22 DIAGNOSIS — R1310 Dysphagia, unspecified: Secondary | ICD-10-CM | POA: Diagnosis not present

## 2022-04-22 MED ORDER — PANTOPRAZOLE SODIUM 40 MG PO TBEC: 40.0000 mg | DELAYED_RELEASE_TABLET | Freq: Every day | ORAL | 11 refills | Status: DC

## 2022-04-22 MED ORDER — SODIUM CHLORIDE 0.9 % IV SOLN: 500.0000 mL | INTRAVENOUS | Status: DC

## 2022-04-22 NOTE — Progress Notes (Signed)
HISTORY OF PRESENT ILLNESS:   [name] is a very pleasant hard of hearing 82 y.o. female, resident of [place] [facility] [facility] and mother of [name], who presents today regarding problems with intermittent dysphagia and what sounds like transient food impaction.  She is accompanied by her daughter.  Previous GI care with Dr. Gala Romney including colonoscopy in 2017 (reviewed).   The current history is that of intermittent solid food dysphagia with transient food impactions, severe at times.  1 severe episode of Thanksgiving led to her being seen emergency room.  Did not require endoscopic attention as this spontaneously passed.  She was noted to have kyphosis and a hiatal hernia on imaging.  She has had other less severe episodes over time.  She reports that she needs to cut her food up in small pieces.  She did have an episode eating [employer].  She does have a history of chronic GERD which is seemingly better and more recent years.  Not on medication for GERD.  Her weight has been stable.   CT scan of the abdomen and pelvis November 2021 revealed fecal impaction.  Colonoscopy May 2017 with diverticulosis and a diminutive polyp.  Blood work from PCP February 10, 2022 with normal CBC including hemoglobin 12.0.  Creatinine 1.62.  Otherwise normal comprehensive metabolic panel.  She is not on blood thinners.  She does take Senokot for constipation.   Normal EF on Lexiscan stress testing 2019.   REVIEW OF SYSTEMS:   All non-GI ROS negative unless otherwise stated in the HPI except for anxiety, arthritis, back pain, cough, fatigue, hearing problems, shortness of breath, excessive thirst, excessive urination       Past Medical History:  Diagnosis Date   Bipolar affective disorder ([place])     CKD (chronic kidney disease)     Depression     Diverticulosis of colon 07/29/2007   Hypertension     Hypotension     Tubular adenoma of colon 07/29/2007    Dr. Faith Rogue colonoscopy, previous focally adenomatous cecal  polyp 2006           Past Surgical History:  Procedure Laterality Date   COLONOSCOPY   2012    Dr. Gala Romney: Tubular adenoma, diverticulosis.   COLONOSCOPY N/A 08/01/2015    Procedure: COLONOSCOPY;  Surgeon: Daneil Dolin, MD;  Location: [facility];  Service: Endoscopy;  Laterality: N/A;  [id number]   KNEE ARTHROSCOPY       spinal tumor removal       TOTAL KNEE ARTHROPLASTY          Social History [name]  reports that she has quit smoking. Her smoking use included cigarettes. She has never used smokeless tobacco. She reports that she does not drink alcohol and does not use drugs.   family history includes Colitis in an other family member; Kidney disease in her mother; Pancreatic cancer in her daughter.        Allergies  Allergen Reactions   Amoxicillin Nausea And Vomiting   Clindamycin/Lincomycin Nausea And Vomiting   Morphine And Related Nausea And Vomiting   Other        Any pain medications. "i have trouble with them".    Oxycodone Nausea Only   Sertraline Hcl        Pt's daughter reported (01/19/20) says "it sends her in to mania"          PHYSICAL EXAMINATION: Vital signs: BP (!) 150/88   Pulse 65   Ht 5' 6"$  (  1.676 m)   Wt 146 lb 6 oz (66.4 kg)   SpO2 97%   BMI 23.63 kg/m   Constitutional: Kyphotic hard of hearing but generally well-appearing elderly female, no acute distress Psychiatric: alert and oriented x3, cooperative Eyes: extraocular movements intact, anicteric, conjunctiva pink Mouth: oral pharynx moist, no lesions Neck: With restricted mobility.  No lymphadenopathy Cardiovascular: heart regular rate and rhythm, no murmur Lungs: clear to auscultation bilaterally Abdomen: soft, nontender, nondistended, no obvious ascites, no peritoneal signs, normal bowel sounds, no organomegaly Rectal: Omitted Extremities: no clubbing, cyanosis, or lower extremity edema bilaterally Skin: no lesions on visible extremities Neuro: No focal deficits.  Cranial nerves  intact   ASSESSMENT:   1.  Intermittent solid food dysphagia with transient food impaction.  Rule out peptic stricture 2.  History of chronic GERD.  Currently without significant symptoms.  On no medical therapy 3.  Hiatal hernia on imaging 4.  Previous colonoscopies elsewhere.  Aged out of surveillance 5.  Functional constipation     PLAN:   1.  Upper endoscopy with probable esophageal dilation.The nature of the procedure, as well as the risks, benefits, and alternatives were carefully and thoroughly reviewed with the patient. Ample time for discussion and questions allowed. The patient understood, was satisfied, and agreed to proceed. 2.  May need PPI.  Discussed 3.  Continue with laxative of choice for constipation 4.  Resume general medical care with PCP and other specialists

## 2022-04-22 NOTE — Progress Notes (Signed)
Called to room to assist during endoscopic procedure.  Patient ID and intended procedure confirmed with present staff. Received instructions for my participation in the procedure from the performing physician.  

## 2022-04-22 NOTE — Progress Notes (Signed)
PATIENT reports no changes to health or medications since office visit 04/13/22

## 2022-04-22 NOTE — Op Note (Signed)
[facility] Patient Name: [name] Procedure Date: 04/22/2022 7:27 AM MRN: [id number] Endoscopist: Docia Chuck. Henrene Pastor , MD, [id number] Age: 82 Referring MD:  Date of Birth: 27-Jul-1940 Gender: Female Account #: [id number] Procedure:                Upper GI endoscopy with balloon dilation of the                            esophagus. 18-20 mm Indications:              Dysphagia Medicines:                Monitored Anesthesia Care Procedure:                Pre-Anesthesia Assessment:                           - Prior to the procedure, a History and Physical                            was performed, and patient medications and                            allergies were reviewed. The patient's tolerance of                            previous anesthesia was also reviewed. The risks                            and benefits of the procedure and the sedation                            options and risks were discussed with the patient.                            All questions were answered, and informed consent                            was obtained. Prior Anticoagulants: The patient has                            taken no anticoagulant or antiplatelet agents. ASA                            Grade Assessment: II - A patient with mild systemic                            disease. After reviewing the risks and benefits,                            the patient was deemed in satisfactory condition to                            undergo the procedure.  After obtaining informed consent, the endoscope was                            passed under direct vision. Throughout the                            procedure, the patient's blood pressure, pulse, and                            oxygen saturations were monitored continuously. The                            Olympus Scope [id number] was introduced through the                            mouth, and advanced to the second part of  duodenum.                            The upper GI endoscopy was accomplished without                            difficulty. The patient tolerated the procedure                            well. Scope In: Scope Out: Findings:                 The exam of the esophagus found it to be somewhat                            tortuous and foreshortened. There was active                            esophagitis at the gastroesophageal junction                            associated with ringlike stricture.                           One benign-appearing, intrinsic moderate stenosis                            was found 35 cm from the incisors. This stenosis                            measured 1.5 cm (inner diameter). A TTS dilator was                            passed through the scope. Dilation with an 18-19-20                            mm balloon dilator was performed to 20 mm.                           The  stomach revealed a moderately large hiatal                            hernia.                           The examined duodenum revealed superficial erosions.                           The cardia and gastric fundus were normal on                            retroflexion. Complications:            No immediate complications. Estimated Blood Loss:     Estimated blood loss: none. Impression:               1. GERD complicated by esophagitis and stricture                            status post dilation                           2. Moderately large hiatal hernia                           3. Duodenitis                           4. Otherwise unremarkable exam. Recommendation:           1. Patient has a contact number available for                            emergencies. The signs and symptoms of potential                            delayed complications were discussed with the                            patient. Return to normal activities tomorrow.                            Written discharge instructions  were provided to the                            patient.                           2. Post dilation diet.                           3. Continue present medications.                           4. Prescribe pantoprazole 40 mg daily; #30; 11                            refills  5. Office follow-up with Dr. Henrene Pastor in 6 weeks Docia Chuck. Henrene Pastor, MD 04/22/2022 8:44:34 AM This report has been signed electronically.

## 2022-04-22 NOTE — Patient Instructions (Signed)
YOU HAD AN ENDOSCOPIC PROCEDURE TODAY AT [facility]:   Refer to the procedure report that was given to you for any specific questions about what was found during the examination.  If the procedure report does not answer your questions, please call your gastroenterologist to clarify.  If you requested that your care partner not be given the details of your procedure findings, then the procedure report has been included in a sealed envelope for you to review at your convenience later.  YOU SHOULD EXPECT: Some feelings of bloating in the abdomen. Passage of more gas than usual.  Walking can help get rid of the air that was put into your GI tract during the procedure and reduce the bloating. If you had a lower endoscopy (such as a colonoscopy or flexible sigmoidoscopy) you may notice spotting of blood in your stool or on the toilet paper. If you underwent a bowel prep for your procedure, you may not have a normal bowel movement for a few days.  Please Note:  You might notice some irritation and congestion in your nose or some drainage.  This is from the oxygen used during your procedure.  There is no need for concern and it should clear up in a day or so.  SYMPTOMS TO REPORT IMMEDIATELY:  Following upper endoscopy (EGD)  Vomiting of blood or coffee ground material  New chest pain or pain under the shoulder blades  Painful or persistently difficult swallowing  New shortness of breath  Fever of 100F or higher  Black, tarry-looking stools  For urgent or emergent issues, a gastroenterologist can be reached at any hour by calling [phone]. Do not use MyChart messaging for urgent concerns.    DIET:  Clear liquids for two hours (10:30am) and then a soft diet for the rest of the day.  Drink plenty of fluids but you should avoid alcoholic beverages for 24 hours.  ACTIVITY:  You should plan to take it easy for the rest of today and you should NOT DRIVE or use heavy machinery until  tomorrow (because of the sedation medicines used during the test).    FOLLOW UP:  **Office visit with Dr. Henrene Pastor in 6 weeks**   Our staff will call the number listed on your records the next business day following your procedure.  We will call around 7:15- 8:00 am to check on you and address any questions or concerns that you may have regarding the information given to you following your procedure. If we do not reach you, we will leave a message.     If any biopsies were taken you will be contacted by phone or by letter within the next 1-3 weeks.  Please call us at [phone] if you have not heard about the biopsies in 3 weeks.    SIGNATURES/CONFIDENTIALITY: You and/or your care partner have signed paperwork which will be entered into your electronic medical record.  These signatures attest to the fact that that the information above on your After Visit Summary has been reviewed and is understood.  Full responsibility of the confidentiality of this discharge information lies with you and/or your care-partner.

## 2022-04-22 NOTE — Progress Notes (Signed)
Vss nad trans to pacu °

## 2022-04-23 ENCOUNTER — Telehealth: Payer: Self-pay | Admitting: *Deleted

## 2022-04-23 NOTE — Telephone Encounter (Signed)
  Follow up Call-     04/22/2022    7:35 AM  Call back number  Post procedure Call Back phone  # [phone]  please call after 10 am  Permission to leave phone message Yes     Patient questions:  Do you have a fever, pain , or abdominal swelling? No. Pain Score  0 *  Have you tolerated food without any problems? Yes.    Have you been able to return to your normal activities? Yes.    Do you have any questions about your discharge instructions: Diet   No. Medications  No. Follow up visit  No.  Do you have questions or concerns about your Care? No.  Actions: * If pain score is 4 or above: No action needed, pain <4.  Pt states she has " a bad taste in my mouth."  No other issues.  Told her to eat/drink to try to get taste out of her mouth.  She will call back with any issues

## 2022-05-15 ENCOUNTER — Other Ambulatory Visit: Payer: Self-pay

## 2022-05-15 ENCOUNTER — Encounter (HOSPITAL_BASED_OUTPATIENT_CLINIC_OR_DEPARTMENT_OTHER): Payer: Self-pay | Admitting: Emergency Medicine

## 2022-05-15 ENCOUNTER — Emergency Department (HOSPITAL_BASED_OUTPATIENT_CLINIC_OR_DEPARTMENT_OTHER): Payer: Medicare Other

## 2022-05-15 ENCOUNTER — Emergency Department (HOSPITAL_BASED_OUTPATIENT_CLINIC_OR_DEPARTMENT_OTHER)
Admission: EM | Admit: 2022-05-15 | Discharge: 2022-05-15 | Disposition: A | Discharge status: Home / Self Care | Payer: Medicare Other | Attending: Emergency Medicine | Admitting: Emergency Medicine

## 2022-05-15 DIAGNOSIS — N201 Calculus of ureter: Secondary | ICD-10-CM

## 2022-05-15 DIAGNOSIS — N132 Hydronephrosis with renal and ureteral calculous obstruction: Secondary | ICD-10-CM | POA: Diagnosis not present

## 2022-05-15 DIAGNOSIS — Z79899 Other long term (current) drug therapy: Secondary | ICD-10-CM | POA: Insufficient documentation

## 2022-05-15 DIAGNOSIS — N134 Hydroureter: Secondary | ICD-10-CM | POA: Insufficient documentation

## 2022-05-15 DIAGNOSIS — N189 Chronic kidney disease, unspecified: Secondary | ICD-10-CM | POA: Insufficient documentation

## 2022-05-15 DIAGNOSIS — I129 Hypertensive chronic kidney disease with stage 1 through stage 4 chronic kidney disease, or unspecified chronic kidney disease: Secondary | ICD-10-CM | POA: Diagnosis not present

## 2022-05-15 DIAGNOSIS — R339 Retention of urine, unspecified: Secondary | ICD-10-CM | POA: Diagnosis present

## 2022-05-15 LAB — BASIC METABOLIC PANEL
Anion gap: 7 (ref 5–15)
BUN: 34 mg/dL — ABNORMAL HIGH (ref 8–23)
CO2: 24 mmol/L (ref 22–32)
Calcium: 10 mg/dL (ref 8.9–10.3)
Chloride: 107 mmol/L (ref 98–111)
Creatinine, Ser: 1.97 mg/dL — ABNORMAL HIGH (ref 0.44–1.00)
GFR, Estimated: 25 mL/min — ABNORMAL LOW (ref >60)
Glucose, Bld: 79 mg/dL (ref 70–99)
Potassium: 4.1 mmol/L (ref 3.5–5.1)
Sodium: 138 mmol/L (ref 135–145)

## 2022-05-15 LAB — URINALYSIS, ROUTINE W REFLEX MICROSCOPIC
Bilirubin Urine: NEGATIVE
Glucose, UA: NEGATIVE mg/dL
Hgb urine dipstick: NEGATIVE
Ketones, ur: NEGATIVE mg/dL
Leukocytes,Ua: NEGATIVE
Nitrite: NEGATIVE
Protein, ur: NEGATIVE mg/dL
Specific Gravity, Urine: 1.006 (ref 1.005–1.030)
pH: 6 (ref 5.0–8.0)

## 2022-05-15 LAB — CBC WITH DIFFERENTIAL/PLATELET
Abs Immature Granulocytes: 0.04 10*3/uL (ref 0.00–0.07)
Basophils Absolute: 0 10*3/uL (ref 0.0–0.1)
Basophils Relative: 0 %
Eosinophils Absolute: 0.2 10*3/uL (ref 0.0–0.5)
Eosinophils Relative: 3 %
HCT: 35.5 % — ABNORMAL LOW (ref 36.0–46.0)
Hemoglobin: 11.5 g/dL — ABNORMAL LOW (ref 12.0–15.0)
Immature Granulocytes: 1 %
Lymphocytes Relative: 25 %
Lymphs Abs: 1.6 10*3/uL (ref 0.7–4.0)
MCH: 30.5 pg (ref 26.0–34.0)
MCHC: 32.4 g/dL (ref 30.0–36.0)
MCV: 94.2 fL (ref 80.0–100.0)
Monocytes Absolute: 0.6 10*3/uL (ref 0.1–1.0)
Monocytes Relative: 10 %
Neutro Abs: 4.1 10*3/uL (ref 1.7–7.7)
Neutrophils Relative %: 61 %
Platelets: 178 10*3/uL (ref 150–400)
RBC: 3.77 MIL/uL — ABNORMAL LOW (ref 3.87–5.11)
RDW: 14.2 % (ref 11.5–15.5)
WBC: 6.6 10*3/uL (ref 4.0–10.5)
nRBC: 0 % (ref 0.0–0.2)

## 2022-05-15 MED ORDER — TAMSULOSIN HCL 0.4 MG PO CAPS: 0.4000 mg | ORAL_CAPSULE | Freq: Every day | ORAL | 0 refills | Status: AC

## 2022-05-15 MED ORDER — MORPHINE SULFATE (PF) 4 MG/ML IV SOLN
4.0000 mg | Freq: Once | INTRAVENOUS | Status: AC
  Administered 2022-05-15: 4 mg via INTRAVENOUS
  Filled 2022-05-15: qty 1

## 2022-05-15 MED ORDER — OXYCODONE HCL 5 MG PO TABS: 5.0000 mg | ORAL_TABLET | Freq: Four times a day (QID) | ORAL | 0 refills | Status: AC | PRN

## 2022-05-15 MED ORDER — ONDANSETRON HCL 4 MG/2ML IJ SOLN
4.0000 mg | Freq: Once | INTRAMUSCULAR | Status: AC
  Administered 2022-05-15: 4 mg via INTRAVENOUS
  Filled 2022-05-15: qty 2

## 2022-05-15 MED ORDER — SODIUM CHLORIDE 0.9 % IV BOLUS
500.0000 mL | Freq: Once | INTRAVENOUS | Status: AC
  Administered 2022-05-15: 500 mL via INTRAVENOUS

## 2022-05-15 MED ORDER — ONDANSETRON HCL 4 MG PO TABS: 4.0000 mg | ORAL_TABLET | Freq: Three times a day (TID) | ORAL | 0 refills | Status: AC | PRN

## 2022-05-15 NOTE — ED Provider Notes (Signed)
[facility] Provider Note   CSN: [id number] Arrival date & time: 05/15/22  1504     History  Chief Complaint  Patient presents with   Urinary Frequency    [name] is a 82 y.o. female with past medical history hypertension and chronic kidney disease who presents to the ED complaining of urinary retention.  Patient states that she has been able to "dribble" out small amounts but has not felt like she has fully empty her bladder in the last 1 to 2 days.  She denies associated dysuria, hematuria, abdominal pain, back pain, lower extremity edema, shortness of breath, chest pain, nausea, vomiting, fever, or chills.  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  She has not been on dialysis in the past.  She reports a remote history of UTIs but her last was at least a few years ago.  She has not been on antibiotics recently.  She started Remeron yesterday but otherwise has no new medications.  Her last nephrology visit was approximately 1 month ago and she reports that no changes were made at that time and her condition was stable.      Home Medications Prior to Admission medications   Medication Sig Start Date End Date Taking? Authorizing Provider  ondansetron (ZOFRAN) 4 MG tablet Take 1 tablet (4 mg total) by mouth every 8 (eight) hours as needed for up to 5 days for nausea or vomiting. 05/15/22 05/20/22 Yes Kassy Mcenroe L, PA-C  oxyCODONE (ROXICODONE) 5 MG immediate release tablet Take 1 tablet (5 mg total) by mouth every 6 (six) hours as needed for up to 5 days for severe pain. 05/15/22 05/20/22 Yes Jennene Downie L, PA-C  tamsulosin (FLOMAX) 0.4 MG CAPS capsule Take 1 capsule (0.4 mg total) by mouth at bedtime for 10 days. 05/15/22 05/25/22 Yes Natsuko Kelsay L, PA-C  acetaminophen (TYLENOL) 325 MG tablet Take 650 mg by mouth every 6  (six) hours as needed. 02/01/20   [provider]  alendronate (FOSAMAX) 70 MG tablet Take 70 mg by mouth every Wednesday. Take with a full glass of water on an empty stomach.    [provider]  ALPRAZolam Duanne Moron) 0.25 MG tablet Take 0.25 mg by mouth daily.    [provider]  amLODipine (NORVASC) 5 MG tablet Take 5 mg by mouth at bedtime. 12/19/19   [provider]  Cholecalciferol (VITAMIN D-3 PO) Take 1,000 Units by mouth daily.  01/24/20   [provider]  Estradiol (VAGIFEM) 10 MCG TABS vaginal tablet Place 10 mcg vaginally once a week.     [provider]  fluconazole (DIFLUCAN) 100 MG tablet Take by mouth once a week.    [provider]  lamoTRIgine (LAMICTAL) 25 MG tablet Take 25 mg by mouth at bedtime.    [provider]  NON FORMULARY 2L fluid restriction in 24 hour period. Document total intake from all sources qshift. 7-3 = 859m 3-11 = 8570m11-7 = 30081mvery Shift Day, Evening, Night 01/24/20   [provider]  pantoprazole (PROTONIX) 40 MG tablet Take 1 tablet (40 mg total) by mouth daily. 04/22/22   PerIrene ShipperD  polyethylene glycol (MIRALAX / GLYCOLAX) 17 g packet Take 17 g by mouth daily. 01/25/20   GooSamuella CotaD  propranolol (INDERAL) 20 MG tablet Take  20 mg by mouth daily. 01/06/14   [provider]  rosuvastatin (CRESTOR) 10 MG tablet Take 10 mg by mouth at bedtime.    [provider]  senna (SENOKOT) 8.6 MG TABS tablet Take 1 tablet (8.6 mg total) by mouth at bedtime. 01/24/20   Samuella Cota, MD  traZODone (DESYREL) 50 MG tablet Take 1 tablet (50 mg total) by mouth at bedtime. 01/24/20   Samuella Cota, MD      Allergies    Amoxicillin, Clindamycin/lincomycin, Morphine and related, Other, Oxycodone, and Sertraline hcl    Review of Systems   Review of Systems  All other systems reviewed and are negative.   Physical Exam Updated Vital Signs BP  (!) 150/100   Pulse 95   Temp 98.8 F (37.1 C)   Resp 18   Ht '5\' 6"'$  (1.676 m)   Wt 66.2 kg   SpO2 95%   BMI 23.56 kg/m  Physical Exam Vitals and nursing note reviewed.  Constitutional:      General: She is not in acute distress.    Appearance: Normal appearance. She is not ill-appearing or toxic-appearing.  HENT:     Head: Normocephalic and atraumatic.     Mouth/Throat:     Mouth: Mucous membranes are moist.  Eyes:     General: No scleral icterus.    Conjunctiva/sclera: Conjunctivae normal.  Cardiovascular:     Rate and Rhythm: Normal rate and regular rhythm.     Heart sounds: No murmur heard. Pulmonary:     Effort: Pulmonary effort is normal.     Breath sounds: Normal breath sounds.  Abdominal:     General: Abdomen is flat. There is no distension.     Palpations: Abdomen is soft. There is no mass.     Tenderness: There is no abdominal tenderness. There is no right CVA tenderness, left CVA tenderness, guarding or rebound.  Musculoskeletal:        General: Normal range of motion.     Cervical back: Normal range of motion and neck supple.     Right lower leg: No edema.     Left lower leg: No edema.  Skin:    General: Skin is warm and dry.     Capillary Refill: Capillary refill takes less than 2 seconds.  Neurological:     Mental Status: She is alert. Mental status is at baseline.  Psychiatric:        Mood and Affect: Mood normal.        Behavior: Behavior normal.     ED Results / Procedures / Treatments   Labs (all labs ordered are listed, but only abnormal results are displayed) Labs Reviewed  URINALYSIS, ROUTINE W REFLEX MICROSCOPIC - Abnormal; Notable for the following components:      Result Value   Color, Urine COLORLESS (*)    All other components within normal limits  CBC WITH DIFFERENTIAL/PLATELET - Abnormal; Notable for the following components:   RBC 3.77 (*)    Hemoglobin 11.5 (*)    HCT 35.5 (*)    All other components within normal limits  BASIC  METABOLIC PANEL - Abnormal; Notable for the following components:   BUN 34 (*)    Creatinine, Ser 1.97 (*)    GFR, Estimated 25 (*)    All other components within normal limits    EKG None  Radiology CT ABDOMEN PELVIS WO CONTRAST  Result Date: 05/15/2022 CLINICAL DATA:  Abdominal pain EXAM: CT ABDOMEN AND PELVIS WITHOUT  CONTRAST TECHNIQUE: Multidetector CT imaging of the abdomen and pelvis was performed following the standard protocol without IV contrast. RADIATION DOSE REDUCTION: This exam was performed according to the departmental dose-optimization program which includes automated exposure control, adjustment of the mA and/or kV according to patient size and/or use of iterative reconstruction technique. COMPARISON:  01/18/2020 FINDINGS: Lower chest: Linear dependent subsegmental atelectasis or scarring right lower lobe. Large hiatal hernia. No pericardial or pleural effusion. Hepatobiliary: Hepatic hypodensity may be related to amiodarone use. No focal hepatic lesions identified. Unremarkable gallbladder. Pancreas: Unremarkable. No pancreatic ductal dilatation or surrounding inflammatory changes. Spleen: Normal in size without focal abnormality. Adrenals/Urinary Tract: Adrenal glands are unremarkable. Left-sided hydronephrosis and hydroureter with a mid left ureteral 5 mm stone. No nephrolithiasis on the right. Urinary bladder appears distended and otherwise unremarkable. Stomach/Bowel: Stomach is within normal limits. Appendix appears normal. No evidence of bowel wall thickening, distention, or inflammatory changes. Diverticulosis noted of the sigmoid. Vascular/Lymphatic: Aorta is markedly tortuous with atheromatous changes. No enlarged abdominal or pelvic lymph nodes. Reproductive: Uterus and bilateral adnexa are unremarkable. Other: No abdominal wall hernia or abnormality. No abdominopelvic ascites. Musculoskeletal: Thoracolumbosacral degenerative changes. IMPRESSION: 1. Large hiatal hernia. 2.  Hyperdense liver most often related to amiodarone use. 3. Left-sided hydronephrosis and hydroureter with an obstructing mid left ureteral 5 mm stone. 4. Diverticulosis. Electronically Signed   By: Sammie Bench M.D.   On: 05/15/2022 19:13    Procedures Procedures    Medications Ordered in ED Medications  sodium chloride 0.9 % bolus 500 mL (0 mLs Intravenous Stopped 05/15/22 2030)  morphine (PF) 4 MG/ML injection 4 mg (4 mg Intravenous Given 05/15/22 2011)  ondansetron (ZOFRAN) injection 4 mg (4 mg Intravenous Given 05/15/22 2010)    ED Course/ Medical Decision Making/ A&P                             Medical Decision Making Amount and/or Complexity of Data Reviewed Labs: ordered. Decision-making details documented in ED Course. Radiology: ordered. Decision-making details documented in ED Course.  Risk Prescription drug management.   Medical Decision Making:   [name] is a 82 y.o. female who presented to the ED today with urinary retention detailed above.    Patient's presentation is complicated by their history of age, multiple medications, recent medication change.  Complete initial physical exam performed, notably the patient was in no acute distress with a soft, nontender abdomen without rebound, guarding, or peritoneal signs.  No CVA tenderness.  She did not appear dehydrated.  She was neurologically intact.  Hypertensive on vital signs but stable. Reviewed and confirmed nursing documentation for past medical history, family history, social history.    Previous most recent creatinine levels from 05/12/22 and 02/10/22 Creatinine 1.96 High    1.62 High       Initial Assessment:   With the patient's presentation of urinary retention, most likely diagnosis is UTI. Differential diagnosis includes but is not limited to AKI, dehydration, outflow obstruction, ESRD, ureterolithiasis, nephrolithiasis, hydronephrosis, hydroureter, bladder or other urinary tract mass, sepsis, acute  abdomen. This is most consistent with an acute complicated illness  Initial Plan:  Screening labs including CBC and Metabolic panel to evaluate for infectious or metabolic etiology of disease.  Urinalysis with reflex culture ordered to evaluate for UTI or relevant urologic/nephrologic pathology.  Bladder scan  Catheterize as needed  CT abdomen pelvis without contrast to evaluate for renal pathology Objective evaluation as  reviewed   Initial Study Results:   Laboratory  All laboratory results reviewed without evidence of clinically relevant pathology.   Exceptions include: Creatinine 1.97, GFR 25  Radiology:  All images reviewed independently. Agree with radiology report at this time.   CT ABDOMEN PELVIS WO CONTRAST  Result Date: 05/15/2022 CLINICAL DATA:  Abdominal pain EXAM: CT ABDOMEN AND PELVIS WITHOUT CONTRAST TECHNIQUE: Multidetector CT imaging of the abdomen and pelvis was performed following the standard protocol without IV contrast. RADIATION DOSE REDUCTION: This exam was performed according to the departmental dose-optimization program which includes automated exposure control, adjustment of the mA and/or kV according to patient size and/or use of iterative reconstruction technique. COMPARISON:  01/18/2020 FINDINGS: Lower chest: Linear dependent subsegmental atelectasis or scarring right lower lobe. Large hiatal hernia. No pericardial or pleural effusion. Hepatobiliary: Hepatic hypodensity may be related to amiodarone use. No focal hepatic lesions identified. Unremarkable gallbladder. Pancreas: Unremarkable. No pancreatic ductal dilatation or surrounding inflammatory changes. Spleen: Normal in size without focal abnormality. Adrenals/Urinary Tract: Adrenal glands are unremarkable. Left-sided hydronephrosis and hydroureter with a mid left ureteral 5 mm stone. No nephrolithiasis on the right. Urinary bladder appears distended and otherwise unremarkable. Stomach/Bowel: Stomach is within  normal limits. Appendix appears normal. No evidence of bowel wall thickening, distention, or inflammatory changes. Diverticulosis noted of the sigmoid. Vascular/Lymphatic: Aorta is markedly tortuous with atheromatous changes. No enlarged abdominal or pelvic lymph nodes. Reproductive: Uterus and bilateral adnexa are unremarkable. Other: No abdominal wall hernia or abnormality. No abdominopelvic ascites. Musculoskeletal: Thoracolumbosacral degenerative changes. IMPRESSION: 1. Large hiatal hernia. 2. Hyperdense liver most often related to amiodarone use. 3. Left-sided hydronephrosis and hydroureter with an obstructing mid left ureteral 5 mm stone. 4. Diverticulosis. Electronically Signed   By: Sammie Bench M.D.   On: 05/15/2022 19:13      Final Assessment and Plan:   This is an 82 year old female who presents to the ED with concern for urinary retention.  She notes that it has been increasingly difficult to urinate over the last few days but she is able to get out "dribbles."  She complains of a discomfort from feeling the urge to urinate but no significant pain, nausea, vomiting, fever, or other associated signs or symptoms.  She does have a history of chronic kidney disease has never required dialysis and typically produces urine multiple times daily.  She states that when she does go she has no dysuria or hematuria.  On initial bladder scan in the ED today, she had a little over 300 mL of urine in the bladder and noted that she was unable to void after being taken to the bathroom multiple times.  Nursing staff catheterized patient with sample sent to the lab.  UA does not appear acutely infected.  Despite catheterization, patient continued to complain of discomfort with a sensation that she needed to urinate.  Creatinine at 1.97 similar to her most recent value and inconsistent with AKI or worsening CKD.  No significant electrolyte disturbances.  With essentially unremarkable workup thus far, CT abdomen  pelvis without contrast ordered to evaluate for renal pathology.  On CT scan, patient has mid left ureterolithiasis with hydronephrosis and hydroureter.  While waiting scan, patient did receive small fluid bolus and was able to void urine on her own multiple times.  Repeat bladder scan showed residuals in the high 100s to 200s.  Patient given pain medication with good relief of symptoms.  Updated patient and sister-in-law at bedside of all findings. As pt has  been able to void and currently has good relief of symptoms, no indication to send home with catheter at this time. Discussed need for Flomax and good hydration at home to help pass kidney stone in addition pain and nausea medication as needed.  Patient and sister in law expressed understanding of this plan.  Patient currently resides at an assisted living facility that can give her her medications as appropriate and continue to monitor her for any declining condition.  Patient and sister-in-law aware of need for close outpatient follow-up with urology. Discussed case with attending MD who is in agreement with plan to discharge home with close outpatient follow up as pt passed void trial. Strict ED return precautions given, all questions answered, and stable for discharge.    Clinical Impression:  1. Left ureteral stone   2. Hydronephrosis with urinary obstruction due to ureteral calculus   3. Hydroureter, left      Discharge           Final Clinical Impression(s) / ED Diagnoses Final diagnoses:  Left ureteral stone  Hydronephrosis with urinary obstruction due to ureteral calculus  Hydroureter, left    Rx / DC Orders ED Discharge Orders          Ordered    tamsulosin (FLOMAX) 0.4 MG CAPS capsule  Daily at bedtime        05/15/22 2125    ondansetron (ZOFRAN) 4 MG tablet  Every 8 hours PRN        05/15/22 2125    oxyCODONE (ROXICODONE) 5 MG immediate release tablet  Every 6 hours PRN        05/15/22 2125               Suzzette Righter, PA-C 05/16/22 0156    Malvin Johns, MD 05/16/22 [phone]

## 2022-05-15 NOTE — Discharge Instructions (Signed)
Thank you for letting us take care of you today.  You have a kidney stone on the left.  I am prescribing you a medication called Flomax to take at home to help pass this.  Please take this daily as instructed.  I am also prescribing a small amount of pain medication as well as nausea medication to help with your pain and discomfort at home.  With your history of frequent nausea as a side effect of most medications, I recommend that you take the pain medication only as absolutely necessary for severe pain.  When you require the pain medication, you should take the nausea medication with it.  You may also take the nausea medication on its own separately from the pain medication as needed.  In addition to the medications prescribed, you may take Tylenol for mild to moderate pain.  You may take up to 1000 mg at 1 time every 6 hours.  Please do not take more than 4000 mg of Tylenol in 24 hours.  I recommend that you follow-up with urology.  I provided their contact information.  Please call their office tomorrow and tell them that you have a 5 mm obstructing kidney stone on the left and need to set up a follow-up appointment ASAP.  You may also follow-up with a different urologist of your own choosing if you prefer.  If you develop any new or other concerning symptoms such as fever, uncontrolled pain, confusion, uncontrolled vomiting, or are unable to urinate using the medications provided, please return to the nearest emergency department for reevaluation.

## 2022-05-15 NOTE — ED Notes (Signed)
Patient escorted to restroom and assisted with specimen cup and hat.  Patient unable to obtain urine at this time.  Patient assisted back to lobby.

## 2022-05-15 NOTE — ED Triage Notes (Signed)
Feels pressure to pee and then when she goes it only drips she states x  a week or 2

## 2022-05-15 NOTE — ED Notes (Signed)
Pt verbalized understanding of d/c instructions, meds, and followup care. Denies questions. Caregiver at bedside to hear teaching as well. [facility] called for short report. Assisted to car in wheelchair.

## 2022-05-20 ENCOUNTER — Ambulatory Visit: Payer: Self-pay | Admitting: Urology

## 2022-06-24 ENCOUNTER — Encounter: Payer: Self-pay | Admitting: Internal Medicine

## 2022-06-24 ENCOUNTER — Ambulatory Visit (INDEPENDENT_AMBULATORY_CARE_PROVIDER_SITE_OTHER): Payer: Medicare Other | Admitting: Internal Medicine

## 2022-06-24 VITALS — BP 126/76 | HR 73 | Ht 63.0 in | Wt 141.0 lb

## 2022-06-24 DIAGNOSIS — K222 Esophageal obstruction: Secondary | ICD-10-CM | POA: Diagnosis not present

## 2022-06-24 DIAGNOSIS — K219 Gastro-esophageal reflux disease without esophagitis: Secondary | ICD-10-CM

## 2022-06-24 DIAGNOSIS — R1310 Dysphagia, unspecified: Secondary | ICD-10-CM

## 2022-06-24 MED ORDER — PANTOPRAZOLE SODIUM 40 MG PO TBEC: 40.0000 mg | DELAYED_RELEASE_TABLET | Freq: Every day | ORAL | 3 refills | Status: DC

## 2022-06-24 NOTE — Patient Instructions (Signed)
_______________________________________________________  If your blood pressure at your visit was 140/90 or greater, please contact your primary care physician to follow up on this.  _______________________________________________________  If you are age 82 or older, your body mass index should be between 23-30. Your Body mass index is 24.98 kg/m. If this is out of the aforementioned range listed, please consider follow up with your Primary Care Provider.  If you are age 65 or younger, your body mass index should be between 19-25. Your Body mass index is 24.98 kg/m. If this is out of the aformentioned range listed, please consider follow up with your Primary Care Provider.   ________________________________________________________  The [facility] providers would like to encourage you to use [facility] to communicate with providers for non-urgent requests or questions.  Due to long hold times on the telephone, sending your provider a message by [facility] may be a faster and more efficient way to get a response.  Please allow 48 business hours for a response.  Please remember that this is for non-urgent requests.  _______________________________________________________  We have sent the following medications to your pharmacy for you to pick up at your convenience:  Omeprazole  PATIENT DOES NOT NEED TO TAKE OMEPRAZOLE UNTIL 30 MINUTES BEFORE BREAKFAST

## 2022-06-24 NOTE — Progress Notes (Signed)
HISTORY OF PRESENT ILLNESS:  [name] is a 82 y.o. female, resident of [facility] and mother of Verlon Au, who was evaluated April 14, 2022 regarding intermittent solid food dysphagia with transient food impactions and chronic GERD.  See that dictation for details.  She subsequently underwent upper endoscopy April 22, 2022.  She was found to have esophagitis and a distal esophageal stricture which was dilated to a maximal diameter of 20 mm with a balloon.  She was prescribed pantoprazole 40 mg daily.  She follows up today as requested.  She is accompanied by her caregiver.  Patient reports resolution of her dysphagia.  No reflux symptoms.  She is quite pleased.  Tolerating medication well.  Review of blood work from May 15, 2022 shows creatinine 1.97.  CBC with hemoglobin 11.5.  CT scan that same day to evaluate abdominal pain revealed a kidney stone.  GI review of systems is otherwise negative.  She does tell me that the nursing home wakes her up at 5:30 AM to give her pantoprazole.  She does not eat breakfast until 7:30 AM.  REVIEW OF SYSTEMS:  All non-GI ROS negative unless otherwise stated in the HPI except for anxiety, arthritis, back pain, depression, hearing problems,  Past Medical History:  Diagnosis Date   Bipolar affective disorder    CKD (chronic kidney disease)    Depression    Diverticulosis of colon 07/29/2007   Hypertension    Hypotension    Tubular adenoma of colon 07/29/2007   Dr. Erskine Speed colonoscopy, previous focally adenomatous cecal polyp 2006    Past Surgical History:  Procedure Laterality Date   COLONOSCOPY  2012   Dr. Jena Gauss: Tubular adenoma, diverticulosis.   COLONOSCOPY N/A 08/01/2015   Procedure: COLONOSCOPY;  Surgeon: Corbin Ade, MD;  Location: [facility];  Service: Endoscopy;  Laterality: N/A;  1345   KNEE ARTHROSCOPY     spinal tumor removal     TOTAL KNEE ARTHROPLASTY      Social History DWANA GARIN  reports that she has quit  smoking. Her smoking use included cigarettes. She has never used smokeless tobacco. She reports that she does not drink alcohol and does not use drugs.  family history includes Colitis in an other family member; Kidney disease in her mother; Pancreatic cancer in her daughter.  Allergies  Allergen Reactions   Amoxicillin Nausea And Vomiting   Clindamycin/Lincomycin Nausea And Vomiting   Morphine And Related Nausea And Vomiting   Other     Any pain medications. "i have trouble with them".    Oxycodone Nausea Only   Sertraline Hcl     Pt's daughter reported (01/19/20) says "it sends her in to mania"       PHYSICAL EXAMINATION: Vital signs: BP 126/76   Pulse 73   Ht  (1.6 m)   Wt 141 lb (64 kg)   BMI 24.98 kg/m   Constitutional: Elderly female with marked kyphosis but generally well-appearing, no acute distress Psychiatric: alert and oriented x3, cooperative Eyes: extraocular movements intact, anicteric, conjunctiva pink Cardiovascular: heart regular rate and rhythm, no murmur Lungs: clear to auscultation bilaterally Abdomen: soft, nontender, non distended Extremities: no lower extremity edema bilaterally Skin: no lesions on visible extremities Neuro: No focal deficits.   ASSESSMENT:  1.  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  Asymptomatic post dilation on PPI 2.  General medical problems   PLAN:  1.  Precautions 2.  Pantoprazole 40 mg daily.  Multiple refills.  Take 30 minutes before first meal.  Medication risk reviewed 3.  Routine office follow-up 1 year 4.  Contact the office in the interim for recurrent problems with dysphagia.  She agrees

## 2022-07-28 ENCOUNTER — Telehealth: Payer: Self-pay | Admitting: Podiatry

## 2022-07-28 ENCOUNTER — Ambulatory Visit (INDEPENDENT_AMBULATORY_CARE_PROVIDER_SITE_OTHER): Payer: Medicare Other | Admitting: Podiatry

## 2022-07-28 DIAGNOSIS — I739 Peripheral vascular disease, unspecified: Secondary | ICD-10-CM | POA: Diagnosis not present

## 2022-07-28 DIAGNOSIS — L600 Ingrowing nail: Secondary | ICD-10-CM

## 2022-07-28 MED ORDER — MUPIROCIN 2 % EX OINT: 1.0000 | TOPICAL_OINTMENT | Freq: Two times a day (BID) | CUTANEOUS | 2 refills | Status: DC

## 2022-07-28 NOTE — Telephone Encounter (Signed)
[name], caregiver, called wanting orders faxed to [facility]. Fax# [phone]  Please call [name], caregiver. [phone]

## 2022-07-28 NOTE — Progress Notes (Signed)
Subjective:   Patient ID: [name], female   DOB: 82 y.o.   MRN: [id number]   HPI Chief Complaint  Patient presents with   Nail Problem    Right hallux nail possible ingrown pt stated that it was red and infected and she went and saw her DR and he gave her an antibiotic     82 year old female presents the office for above concerns.  She is been having pain to the right big toenail and she recently started cephalexin which she is still taking.  She states that she had some pus previously but when she pressed on it it came out she is not seeing it since.  Overall is feeling better since starting the antibiotic.  No fevers or chills.    ROS: All systems negative except otherwise noted.  Past Medical History:  Diagnosis Date   Bipolar affective disorder (HCC)    CKD (chronic kidney disease)    Depression    Diverticulosis of colon 07/29/2007   Hypertension    Hypotension    Tubular adenoma of colon 07/29/2007   Dr. Erskine Speed colonoscopy, previous focally adenomatous cecal polyp 2006    Past Surgical History:  Procedure Laterality Date   COLONOSCOPY  2012   Dr. Jena Gauss: Tubular adenoma, diverticulosis.   COLONOSCOPY N/A 08/01/2015   Procedure: COLONOSCOPY;  Surgeon: Corbin Ade, MD;  Location: [facility];  Service: Endoscopy;  Laterality: N/A;  1345   KNEE ARTHROSCOPY     spinal tumor removal     TOTAL KNEE ARTHROPLASTY       Current Outpatient Medications:    mupirocin ointment (BACTROBAN) 2 %, Apply 1 Application topically 2 (two) times daily., Disp: 30 g, Rfl: 2   acetaminophen (TYLENOL) 325 MG tablet, Take 650 mg by mouth every 6 (six) hours as needed., Disp: , Rfl:    alendronate (FOSAMAX) 70 MG tablet, Take 70 mg by mouth every Wednesday. Take with a full glass of water on an empty stomach., Disp: , Rfl:    ALPRAZolam (XANAX) 0.25 MG tablet, Take 0.25 mg by mouth daily., Disp: , Rfl:    amLODipine (NORVASC) 5 MG tablet, Take 5 mg by mouth at bedtime., Disp: ,  Rfl:    Cholecalciferol (VITAMIN D-3 PO), Take 1,000 Units by mouth daily. , Disp: , Rfl:    Estradiol (VAGIFEM) 10 MCG TABS vaginal tablet, Place 10 mcg vaginally once a week. , Disp: , Rfl:    fluconazole (DIFLUCAN) 100 MG tablet, Take by mouth once a week., Disp: , Rfl:    lamoTRIgine (LAMICTAL) 25 MG tablet, Take 25 mg by mouth at bedtime., Disp: , Rfl:    NON FORMULARY, 2L fluid restriction in 24 hour period. Document total intake from all sources qshift. 7-3 = 3-11 = 11-7 = Every Shift Day, Evening, Night, Disp: , Rfl:    pantoprazole (PROTONIX) 40 MG tablet, Take 1 tablet (40 mg total) by mouth daily., Disp: 90 tablet, Rfl: 3   polyethylene glycol (MIRALAX / GLYCOLAX) 17 g packet, Take 17 g by mouth daily., Disp: , Rfl:    propranolol (INDERAL) 20 MG tablet, Take 20 mg by mouth daily., Disp: , Rfl:    rosuvastatin (CRESTOR) 10 MG tablet, Take 10 mg by mouth at bedtime., Disp: , Rfl:    senna (SENOKOT) 8.6 MG TABS tablet, Take 1 tablet (8.6 mg total) by mouth at bedtime., Disp: , Rfl:    traZODone (DESYREL) 50 MG tablet, Take 1 tablet (  50 mg total) by mouth at bedtime., Disp: , Rfl:   Allergies  Allergen Reactions   Amoxicillin Nausea And Vomiting   Clindamycin/Lincomycin Nausea And Vomiting   Morphine And Codeine Nausea And Vomiting   Other     Any pain medications. "i have trouble with them".    Oxycodone Nausea Only   Sertraline Hcl     Pt's daughter reported (01/19/20) says "it sends her in to mania"         Objective:  Physical Exam  General: AAO x3, NAD-presents with caregiver  Dermatological: Right hallux nail is hypertrophic, dystrophic with yellow discoloration nail slightly loose.  There is some localized edema erythema of the proximal nail fold there is no drainage or pus, ascending cellulitis.  There is no fluctuance or crepitation but there is no malodor.  Vascular: D DP pulse 1/4, unable to palpate PT pulse.    Neruologic: Grossly intact via  light touch bilateral.   Musculoskeletal: Mild tenderness palpation on the proximal nail fold of the right hallux toenail.     Assessment:   Right hallux onychodystrophy, toenail infection     Plan:  -Treatment options discussed including all alternatives, risks, and complications -Etiology of symptoms were discussed -We discussed treatment options including nail removal versus conservative treatment.  Given decreased circulation and her symptoms are improving.  Start with conservative treatment.  I was able to debride some of the loose nail without any complications.  Recommend Epsom salt soaks daily with her caregiver.  Antibiotic ointment around the nail.  Discussed changing daily. -Ordered ABI -If no improvement nail removal -Monitor for any clinical signs or symptoms of infection and directed to call the office immediately should any occur or go to the ER.  Return in about 2 weeks (around 08/11/2022) for toenail infection.  Vivi Barrack DPM

## 2022-07-28 NOTE — Patient Instructions (Signed)
Soak Instructions    THE DAY AFTER THE PROCEDURE  Place 1/4 cup of epsom salts in a quart of warm tap water.  Soak in the solution for 100 minutes.  This soak should be done twice a day.  Next, remove your foot or feet from solution, blot dry the affected area and cover.  You may use a band aid large enough to cover the area or use gauze and tape.  Apply other medications to the area as directed by the doctor such as polysporin neosporin.  IF YOUR SKIN BECOMES IRRITATED WHILE USING THESE INSTRUCTIONS, IT IS OKAY TO SWITCH TO  WHITE VINEGAR AND WATER. Or you may use antibacterial soap and water to keep the toe clean  Monitor for any signs/symptoms of infection. Call the office immediately if any occur or go directly to the emergency room. Call with any questions/concerns.

## 2022-07-31 ENCOUNTER — Telehealth: Payer: Self-pay | Admitting: Podiatry

## 2022-07-31 NOTE — Telephone Encounter (Signed)
[facility] called stating needing a doctors signature for the soaking orders- message left on nurse line

## 2022-08-12 ENCOUNTER — Telehealth: Payer: Self-pay | Admitting: Podiatry

## 2022-08-12 NOTE — Telephone Encounter (Signed)
Launa Flight, who is the patient's daughter, just called asking about vascular studies that were to be scheduled for the patient.  She hasn't received word on the status of this being set up or authorized by insurance.    She is scheduled to f/u with Dr. Ardelle Anton this Friday to review those results.  The vascular lab that she's supposed to go to does have an opening this afternoon and one tomorrow morning, but they informed the daughter they are waiting to hear from you.    I did apologize for any delay and told her we were short-staffed at the moment.  She understood.  But, if she can get a response about this so that the test can be done today or tomorrow, that would be great.  Otherwise, they are probably going to have to change their appointment with Dr. Ardelle Anton if the tests can't be done before her appt with him on Friday.  Her call back #[phone].

## 2022-08-13 ENCOUNTER — Ambulatory Visit (HOSPITAL_COMMUNITY)
Admission: RE | Admit: 2022-08-13 | Discharge: 2022-08-13 | Disposition: A | Discharge status: Home / Self Care | Payer: Medicare Other | Source: Ambulatory Visit | Attending: Cardiovascular Disease | Admitting: Cardiovascular Disease

## 2022-08-13 DIAGNOSIS — I739 Peripheral vascular disease, unspecified: Secondary | ICD-10-CM

## 2022-08-13 LAB — VAS US ABI WITH/WO TBI
Left ABI: 1.08
Right ABI: 1.01

## 2022-08-15 ENCOUNTER — Ambulatory Visit (INDEPENDENT_AMBULATORY_CARE_PROVIDER_SITE_OTHER): Payer: Medicare Other | Admitting: Podiatry

## 2022-08-15 DIAGNOSIS — M79675 Pain in left toe(s): Secondary | ICD-10-CM

## 2022-08-15 DIAGNOSIS — M79674 Pain in right toe(s): Secondary | ICD-10-CM

## 2022-08-15 DIAGNOSIS — B351 Tinea unguium: Secondary | ICD-10-CM

## 2022-08-15 DIAGNOSIS — L600 Ingrowing nail: Secondary | ICD-10-CM

## 2022-08-15 MED ORDER — MUPIROCIN 2 % EX OINT: 1.0000 | TOPICAL_OINTMENT | Freq: Two times a day (BID) | CUTANEOUS | 2 refills | Status: DC

## 2022-08-15 NOTE — Patient Instructions (Signed)
Continue with a small amount of antibiotic ointment and a band-aid on the right big toe daily for 1 week. You can use mupirocin ointment that I sent over.   Monitor for any signs/symptoms of infection. Call the office immediately if any occur or go directly to the emergency room. Call with any questions/concerns.

## 2022-08-21 NOTE — Progress Notes (Signed)
Subjective:   Patient ID: [name], female   DOB: 82 y.o.   MRN: [id number]   HPI Chief Complaint  Patient presents with   Nail Problem    RM 14 Right great nail infection. Pt states she is healing but thinks her nail is detaching from her nail bed.     82 year old female presents the office for above concerns.  She is relatively much better.  Only issue that she thinks is becoming loose but there is no swelling, redness or any drainage.  No pain in the nail.  I received a call from her facility stated that she refused to have further soaking as the toe is doing better.    Objective:  Physical Exam  General: AAO x3, NAD-presents with caregiver  Dermatological: Right hallux nails hypertrophic, dystrophic with yellow, brown discoloration and somewhat loose distally but from adhered proximally.  There is no edema, erythema or signs of infection and there is no pain of the toenail today.  In general her other nails are about the same as well.  Other nails are adhered to the nailbed.  The nails do cause discomfort to get long and rub to nails 1-5 bilaterally.  Vascular: Warm and well-perfused  Neruologic: Grossly intact via light touch bilateral.   Musculoskeletal: No significant pain on the proximal nail fold today.  She does get some discomfort at the tip of the toenails as long     Assessment:   Right hallux onychodystrophy, toenail infection-resolved; symptomatic onychomycosis     Plan:  -Treatment options discussed including all alternatives, risks, and complications -Etiology of symptoms were discussed -Reviewed the arterial study with her which was normal. -We discussed the conservative as well as surgical options for the toenail.  The nail is currently not causing pain on the proximal nail fold no signs of infection will hold off removing this today.  I did sharply debride the nails x 10 without any complications or bleeding and recommended routine debridement.  The  particular right hallux toenail is able to debride the loose nail with maturing nail still firmly adhered after debridement.  -Daily foot inspection  Return in about 2 months (around 10/15/2022).  Vivi Barrack DPM

## 2022-10-24 ENCOUNTER — Ambulatory Visit: Payer: Medicare Other | Admitting: Podiatry

## 2022-12-01 ENCOUNTER — Ambulatory Visit: Payer: Medicare Other | Admitting: Podiatry

## 2022-12-15 ENCOUNTER — Encounter: Payer: Self-pay | Admitting: Podiatry

## 2022-12-15 ENCOUNTER — Ambulatory Visit (INDEPENDENT_AMBULATORY_CARE_PROVIDER_SITE_OTHER): Payer: Medicare Other | Admitting: Podiatry

## 2022-12-15 DIAGNOSIS — M79675 Pain in left toe(s): Secondary | ICD-10-CM | POA: Diagnosis not present

## 2022-12-15 DIAGNOSIS — B351 Tinea unguium: Secondary | ICD-10-CM | POA: Diagnosis not present

## 2022-12-15 DIAGNOSIS — M79674 Pain in right toe(s): Secondary | ICD-10-CM | POA: Diagnosis not present

## 2022-12-15 NOTE — Progress Notes (Signed)
Subjective:   Patient ID: [name], female   DOB: 82 y.o.   MRN: [id number]   HPI Chief Complaint  Patient presents with   Nail Problem    Patient is here for routine foot care and nail trim     82 year old female presents the office for above concerns.  Nails are thickened elongated and causing discomfort.  No swelling redness or drainage.  She has no other concerns.    Objective:  Physical Exam  General: AAO x3, NAD-presents with caregiver  Dermatological: Nails are hypertrophic, dystrophic, brittle, discolored, elongated 10. No surrounding redness or drainage. Tenderness nails 1-5 bilaterally. No open lesions or pre-ulcerative lesions are identified today.  Vascular: Warm and well-perfused  Neruologic: Grossly intact via light touch bilateral.   Musculoskeletal: No significant pain on the proximal nail fold today.  She does get some discomfort at the tip of the toenails as long     Assessment:   Symptomatic onychomycosis    Plan:  -Treatment options discussed including all alternatives, risks, and complications -Etiology of symptoms were discussed -Sharply debrided nails x 10 without any complications or bleeding. -Previous ABI 08/13/2022: WNL  Return in about 3 months (around 03/17/2023).  Vivi Barrack DPM

## 2023-01-27 ENCOUNTER — Telehealth: Payer: Self-pay | Admitting: Pharmacy Technician

## 2023-01-27 ENCOUNTER — Telehealth: Payer: Self-pay

## 2023-01-27 ENCOUNTER — Other Ambulatory Visit: Payer: Self-pay

## 2023-01-27 DIAGNOSIS — D631 Anemia in chronic kidney disease: Secondary | ICD-10-CM | POA: Insufficient documentation

## 2023-01-27 NOTE — Telephone Encounter (Signed)
Auth Submission: NO AUTH NEEDED Site of care: Site of care: [facility] Payer: medicare a/b, bcbs supp Medication & CPT/J Code(s) submitted: Feraheme (ferumoxytol) [id number] Route of submission (phone, fax, portal): phone Phone # Fax # Auth type: Buy/Bill HB Units/visits requested: 510mg , 2 doses Reference number:  Approval from: 01/27/23 to 03/10/23

## 2023-01-27 NOTE — Telephone Encounter (Signed)
Auth Submission: NO AUTH NEEDED Site of care: Site of care: [facility] Payer: MEDICARE A/B AND BCBS SUPP Medication & CPT/J Code(s) submitted: Feraheme (ferumoxytol) [id number] Route of submission (phone, fax, portal):  Phone # Fax # Auth type: Buy/Bill HB Units/visits requested: 2 Reference number:  Approval from: 01/27/23 to 03/10/23

## 2023-02-13 ENCOUNTER — Ambulatory Visit (INDEPENDENT_AMBULATORY_CARE_PROVIDER_SITE_OTHER): Payer: Medicare Other

## 2023-02-13 VITALS — BP 144/81 | HR 69 | Temp 97.5°F | Resp 16 | Ht 65.0 in | Wt 156.8 lb

## 2023-02-13 DIAGNOSIS — N189 Chronic kidney disease, unspecified: Secondary | ICD-10-CM

## 2023-02-13 DIAGNOSIS — D631 Anemia in chronic kidney disease: Secondary | ICD-10-CM | POA: Diagnosis not present

## 2023-02-13 MED ORDER — ACETAMINOPHEN 325 MG PO TABS
650.0000 mg | ORAL_TABLET | Freq: Once | ORAL | Status: AC
  Administered 2023-02-13: 650 mg via ORAL
  Filled 2023-02-13: qty 2

## 2023-02-13 MED ORDER — DIPHENHYDRAMINE HCL 25 MG PO CAPS
25.0000 mg | ORAL_CAPSULE | Freq: Once | ORAL | Status: AC
  Administered 2023-02-13: 25 mg via ORAL
  Filled 2023-02-13: qty 1

## 2023-02-13 MED ORDER — FERUMOXYTOL INJECTION 510 MG/17 ML
510.0000 mg | Freq: Once | INTRAVENOUS | Status: AC
  Administered 2023-02-13: 510 mg via INTRAVENOUS
  Filled 2023-02-13: qty 17

## 2023-02-13 NOTE — Patient Instructions (Signed)
 Ferumoxytol Injection What is this medication? FERUMOXYTOL (FER ue MOX i tol) treats low levels of iron in your body (iron deficiency anemia). Iron is a mineral that plays an important role in making red blood cells, which carry oxygen from your lungs to the rest of your body. This medicine may be used for other purposes; ask your health care provider or pharmacist if you have questions. COMMON BRAND NAME(S): Feraheme What should I tell my care team before I take this medication? They need to know if you have any of these conditions: Anemia not caused by low iron levels High levels of iron in the blood Magnetic resonance imaging (MRI) test scheduled An unusual or allergic reaction to iron, other medications, foods, dyes, or preservatives Pregnant or trying to get pregnant Breastfeeding How should I use this medication? This medication is injected into a vein. It is given by your care team in a hospital or clinic setting. Talk to your care team the use of this medication in children. Special care may be needed. Overdosage: If you think you have taken too much of this medicine contact a poison control center or emergency room at once. NOTE: This medicine is only for you. Do not share this medicine with others. What if I miss a dose? It is important not to miss your dose. Call your care team if you are unable to keep an appointment. What may interact with this medication? Other iron products This list may not describe all possible interactions. Give your health care provider a list of all the medicines, herbs, non-prescription drugs, or dietary supplements you use. Also tell them if you smoke, drink alcohol, or use illegal drugs. Some items may interact with your medicine. What should I watch for while using this medication? Visit your care team regularly. Tell your care team if your symptoms do not start to get better or if they get worse. You may need blood work done while you are taking this  medication. You may need to follow a special diet. Talk to your care team. Foods that contain iron include: whole grains/cereals, dried fruits, beans, or peas, leafy green vegetables, and organ meats (liver, kidney). What side effects may I notice from receiving this medication? Side effects that you should report to your care team as soon as possible: Allergic reactions--skin rash, itching, hives, swelling of the face, lips, tongue, or throat Low blood pressure--dizziness, feeling faint or lightheaded, blurry vision Shortness of breath Side effects that usually do not require medical attention (report to your care team if they continue or are bothersome): Flushing Headache Joint pain Muscle pain Nausea Pain, redness, or irritation at injection site This list may not describe all possible side effects. Call your doctor for medical advice about side effects. You may report side effects to FDA at 1-800-FDA-1088. Where should I keep my medication? This medication is given in a hospital or clinic. It will not be stored at home. NOTE: This sheet is a summary. It may not cover all possible information. If you have questions about this medicine, talk to your doctor, pharmacist, or health care provider.  2024 Elsevier/Gold Standard (2022-08-01 00:00:00)

## 2023-02-13 NOTE — Progress Notes (Signed)
Diagnosis: Iron Deficiency Anemia  Provider:  Chilton Greathouse MD  Procedure: IV Infusion  IV Type: Peripheral, IV Location: L Forearm  Feraheme (Ferumoxytol), Dose: 510 mg  Infusion Start Time: 1522  Infusion Stop Time: 1540  Post Infusion IV Care: Observation period completed and Peripheral IV Discontinued  Discharge: Condition: Good, Destination: Nursing Home . AVS Provided  Performed by:  Rico Ala, LPN

## 2023-02-20 ENCOUNTER — Ambulatory Visit (INDEPENDENT_AMBULATORY_CARE_PROVIDER_SITE_OTHER): Payer: Medicare Other

## 2023-02-20 VITALS — BP 129/75 | HR 68 | Temp 97.6°F | Resp 18 | Ht 65.0 in | Wt 159.2 lb

## 2023-02-20 DIAGNOSIS — D631 Anemia in chronic kidney disease: Secondary | ICD-10-CM | POA: Diagnosis not present

## 2023-02-20 DIAGNOSIS — N189 Chronic kidney disease, unspecified: Secondary | ICD-10-CM

## 2023-02-20 MED ORDER — SODIUM CHLORIDE 0.9 % IV SOLN
510.0000 mg | Freq: Once | INTRAVENOUS | Status: AC
  Administered 2023-02-20: 510 mg via INTRAVENOUS
  Filled 2023-02-20: qty 17

## 2023-02-20 MED ORDER — ACETAMINOPHEN 325 MG PO TABS
650.0000 mg | ORAL_TABLET | Freq: Once | ORAL | Status: AC
  Administered 2023-02-20: 650 mg via ORAL
  Filled 2023-02-20: qty 2

## 2023-02-20 MED ORDER — DIPHENHYDRAMINE HCL 25 MG PO CAPS
25.0000 mg | ORAL_CAPSULE | Freq: Once | ORAL | Status: AC
Start: 2023-02-20 — End: 2023-02-20
  Administered 2023-02-20: 25 mg via ORAL
  Filled 2023-02-20: qty 1

## 2023-02-20 NOTE — Progress Notes (Signed)
Diagnosis: Iron Deficiency Anemia  Provider:  Chilton Greathouse MD  Procedure: IV Infusion  IV Type: Peripheral, IV Location: R Antecubital  Feraheme (Ferumoxytol), Dose: 510 mg  Infusion Start Time: 1533  Infusion Stop Time: 1555  Post Infusion IV Care: Observation period completed and Peripheral IV Discontinued  Discharge: Condition: Good, Destination: Home . AVS Provided  Performed by:  Adriana Mccallum, RN

## 2023-02-20 NOTE — Patient Instructions (Signed)
 Ferumoxytol Injection What is this medication? FERUMOXYTOL (FER ue MOX i tol) treats low levels of iron in your body (iron deficiency anemia). Iron is a mineral that plays an important role in making red blood cells, which carry oxygen from your lungs to the rest of your body. This medicine may be used for other purposes; ask your health care provider or pharmacist if you have questions. COMMON BRAND NAME(S): Feraheme What should I tell my care team before I take this medication? They need to know if you have any of these conditions: Anemia not caused by low iron levels High levels of iron in the blood Magnetic resonance imaging (MRI) test scheduled An unusual or allergic reaction to iron, other medications, foods, dyes, or preservatives Pregnant or trying to get pregnant Breastfeeding How should I use this medication? This medication is injected into a vein. It is given by your care team in a hospital or clinic setting. Talk to your care team the use of this medication in children. Special care may be needed. Overdosage: If you think you have taken too much of this medicine contact a poison control center or emergency room at once. NOTE: This medicine is only for you. Do not share this medicine with others. What if I miss a dose? It is important not to miss your dose. Call your care team if you are unable to keep an appointment. What may interact with this medication? Other iron products This list may not describe all possible interactions. Give your health care provider a list of all the medicines, herbs, non-prescription drugs, or dietary supplements you use. Also tell them if you smoke, drink alcohol, or use illegal drugs. Some items may interact with your medicine. What should I watch for while using this medication? Visit your care team regularly. Tell your care team if your symptoms do not start to get better or if they get worse. You may need blood work done while you are taking this  medication. You may need to follow a special diet. Talk to your care team. Foods that contain iron include: whole grains/cereals, dried fruits, beans, or peas, leafy green vegetables, and organ meats (liver, kidney). What side effects may I notice from receiving this medication? Side effects that you should report to your care team as soon as possible: Allergic reactions--skin rash, itching, hives, swelling of the face, lips, tongue, or throat Low blood pressure--dizziness, feeling faint or lightheaded, blurry vision Shortness of breath Side effects that usually do not require medical attention (report to your care team if they continue or are bothersome): Flushing Headache Joint pain Muscle pain Nausea Pain, redness, or irritation at injection site This list may not describe all possible side effects. Call your doctor for medical advice about side effects. You may report side effects to FDA at 1-800-FDA-1088. Where should I keep my medication? This medication is given in a hospital or clinic. It will not be stored at home. NOTE: This sheet is a summary. It may not cover all possible information. If you have questions about this medicine, talk to your doctor, pharmacist, or health care provider.  2024 Elsevier/Gold Standard (2022-08-01 00:00:00)

## 2023-03-17 ENCOUNTER — Ambulatory Visit (INDEPENDENT_AMBULATORY_CARE_PROVIDER_SITE_OTHER): Payer: Medicare Other | Admitting: Podiatry

## 2023-03-17 DIAGNOSIS — M79674 Pain in right toe(s): Secondary | ICD-10-CM

## 2023-03-17 DIAGNOSIS — M79675 Pain in left toe(s): Secondary | ICD-10-CM

## 2023-03-17 DIAGNOSIS — B351 Tinea unguium: Secondary | ICD-10-CM

## 2023-03-17 NOTE — Progress Notes (Signed)
 Subjective:   Patient ID: [name], female   DOB: 82 y.o.   MRN: [id number]   HPI No chief complaint on file.    83 year old female presents the office for above concerns.  Nails are thickened elongated and causing discomfort.  No swelling redness or drainage.  She has no other concerns.    Objective:  Physical Exam  General: AAO x3, NAD-presents with caregiver  Dermatological: Nails are hypertrophic, dystrophic, brittle, discolored, elongated 10. No surrounding redness or drainage. Tenderness nails 1-5 bilaterally. No open lesions or pre-ulcerative lesions are identified today.  Vascular: Warm and well-perfused  Neruologic: Grossly intact via light touch bilateral.   Musculoskeletal: No significant pain on the proximal nail fold today.  She does get some discomfort at the tip of the toenails as long     Assessment:   Symptomatic onychomycosis    Plan:  -Treatment options discussed including all alternatives, risks, and complications -Etiology of symptoms were discussed -Sharply debrided nails x 10 without any complications or bleeding. -Previous ABI 08/13/2022: WNL  Return in about 3 months (around 06/15/2023).  Donnice JONELLE Fees DPM

## 2023-06-15 ENCOUNTER — Ambulatory Visit: Payer: Medicare Other | Admitting: Podiatry

## 2023-09-10 ENCOUNTER — Other Ambulatory Visit (HOSPITAL_COMMUNITY): Payer: Self-pay | Admitting: Obstetrics & Gynecology

## 2023-09-10 DIAGNOSIS — Z1231 Encounter for screening mammogram for malignant neoplasm of breast: Secondary | ICD-10-CM

## 2023-11-16 ENCOUNTER — Ambulatory Visit (HOSPITAL_COMMUNITY)
Admission: RE | Admit: 2023-11-16 | Discharge: 2023-11-16 | Disposition: A | Discharge status: Home / Self Care | Source: Ambulatory Visit | Attending: Obstetrics & Gynecology | Admitting: Obstetrics & Gynecology

## 2023-11-16 ENCOUNTER — Encounter (HOSPITAL_COMMUNITY): Payer: Self-pay

## 2023-11-16 DIAGNOSIS — Z1231 Encounter for screening mammogram for malignant neoplasm of breast: Secondary | ICD-10-CM | POA: Diagnosis present

## 2023-11-19 ENCOUNTER — Inpatient Hospital Stay
Admission: RE | Admit: 2023-11-19 | Discharge: 2023-11-19 | Disposition: A | Discharge status: Home / Self Care | Payer: Self-pay | Source: Ambulatory Visit | Attending: Obstetrics & Gynecology | Admitting: Obstetrics & Gynecology

## 2023-11-19 ENCOUNTER — Other Ambulatory Visit (HOSPITAL_COMMUNITY): Payer: Self-pay | Admitting: Obstetrics & Gynecology

## 2023-11-19 DIAGNOSIS — Z1231 Encounter for screening mammogram for malignant neoplasm of breast: Secondary | ICD-10-CM

## 2024-03-15 ENCOUNTER — Emergency Department (HOSPITAL_COMMUNITY)

## 2024-03-15 ENCOUNTER — Emergency Department (HOSPITAL_COMMUNITY)
Admission: EM | Admit: 2024-03-15 | Discharge: 2024-03-15 | Disposition: A | Discharge status: Home / Self Care | Source: Skilled Nursing Facility | Attending: Emergency Medicine | Admitting: Emergency Medicine

## 2024-03-15 ENCOUNTER — Encounter (HOSPITAL_COMMUNITY): Payer: Self-pay

## 2024-03-15 ENCOUNTER — Other Ambulatory Visit: Payer: Self-pay

## 2024-03-15 DIAGNOSIS — R0602 Shortness of breath: Secondary | ICD-10-CM | POA: Diagnosis present

## 2024-03-15 DIAGNOSIS — I10 Essential (primary) hypertension: Secondary | ICD-10-CM | POA: Insufficient documentation

## 2024-03-15 DIAGNOSIS — D649 Anemia, unspecified: Secondary | ICD-10-CM | POA: Insufficient documentation

## 2024-03-15 DIAGNOSIS — Z79899 Other long term (current) drug therapy: Secondary | ICD-10-CM | POA: Insufficient documentation

## 2024-03-15 DIAGNOSIS — D508 Other iron deficiency anemias: Secondary | ICD-10-CM

## 2024-03-15 LAB — COMPREHENSIVE METABOLIC PANEL WITH GFR
ALT: 10 U/L (ref 0–44)
AST: 17 U/L (ref 15–41)
Albumin: 4.2 g/dL (ref 3.5–5.0)
Alkaline Phosphatase: 78 U/L (ref 38–126)
Anion gap: 8 (ref 5–15)
BUN: 25 mg/dL — ABNORMAL HIGH (ref 8–23)
CO2: 25 mmol/L (ref 22–32)
Calcium: 10.1 mg/dL (ref 8.9–10.3)
Chloride: 111 mmol/L (ref 98–111)
Creatinine, Ser: 1.77 mg/dL — ABNORMAL HIGH (ref 0.44–1.00)
GFR, Estimated: 28 mL/min — ABNORMAL LOW
Glucose, Bld: 91 mg/dL (ref 70–99)
Potassium: 4.2 mmol/L (ref 3.5–5.1)
Sodium: 145 mmol/L (ref 135–145)
Total Bilirubin: <0.2 mg/dL (ref 0.0–1.2)
Total Protein: 6.7 g/dL (ref 6.5–8.1)

## 2024-03-15 LAB — VITAMIN B12: Vitamin B-12: 253 pg/mL (ref 180–914)

## 2024-03-15 LAB — URINALYSIS, ROUTINE W REFLEX MICROSCOPIC
Bilirubin Urine: NEGATIVE
Glucose, UA: NEGATIVE mg/dL
Ketones, ur: NEGATIVE mg/dL
Nitrite: NEGATIVE
Protein, ur: NEGATIVE mg/dL
Specific Gravity, Urine: 1.003 — ABNORMAL LOW (ref 1.005–1.030)
pH: 7 (ref 5.0–8.0)

## 2024-03-15 LAB — RESP PANEL BY RT-PCR (RSV, FLU A&B, COVID)  RVPGX2
Influenza A by PCR: NEGATIVE
Influenza B by PCR: NEGATIVE
Resp Syncytial Virus by PCR: NEGATIVE
SARS Coronavirus 2 by RT PCR: NEGATIVE

## 2024-03-15 LAB — CBC WITH DIFFERENTIAL/PLATELET
Abs Immature Granulocytes: 0.05 K/uL (ref 0.00–0.07)
Basophils Absolute: 0 K/uL (ref 0.0–0.1)
Basophils Relative: 0 %
Eosinophils Absolute: 0.2 K/uL (ref 0.0–0.5)
Eosinophils Relative: 2 %
HCT: 25 % — ABNORMAL LOW (ref 36.0–46.0)
Hemoglobin: 7.1 g/dL — ABNORMAL LOW (ref 12.0–15.0)
Immature Granulocytes: 1 %
Lymphocytes Relative: 13 %
Lymphs Abs: 1 K/uL (ref 0.7–4.0)
MCH: 26.4 pg (ref 26.0–34.0)
MCHC: 28.4 g/dL — ABNORMAL LOW (ref 30.0–36.0)
MCV: 92.9 fL (ref 80.0–100.0)
Monocytes Absolute: 0.7 K/uL (ref 0.1–1.0)
Monocytes Relative: 9 %
Neutro Abs: 5.6 K/uL (ref 1.7–7.7)
Neutrophils Relative %: 75 %
Platelets: 227 K/uL (ref 150–400)
RBC: 2.69 MIL/uL — ABNORMAL LOW (ref 3.87–5.11)
RDW: 14.6 % (ref 11.5–15.5)
WBC: 7.5 K/uL (ref 4.0–10.5)
nRBC: 0 % (ref 0.0–0.2)

## 2024-03-15 LAB — RETICULOCYTES
Immature Retic Fract: 29.3 % — ABNORMAL HIGH (ref 2.3–15.9)
RBC.: 2.66 MIL/uL — ABNORMAL LOW (ref 3.87–5.11)
Retic Count, Absolute: 54.3 K/uL (ref 19.0–186.0)
Retic Ct Pct: 2 % (ref 0.4–3.1)

## 2024-03-15 LAB — FERRITIN: Ferritin: 10 ng/mL — ABNORMAL LOW (ref 11–307)

## 2024-03-15 LAB — IRON AND TIBC
Iron: 12 ug/dL — ABNORMAL LOW (ref 28–170)
Saturation Ratios: 3 % — ABNORMAL LOW (ref 10.4–31.8)
TIBC: 375 ug/dL (ref 250–450)
UIBC: 364 ug/dL

## 2024-03-15 LAB — TROPONIN T, HIGH SENSITIVITY
Troponin T High Sensitivity: 38 ng/L — ABNORMAL HIGH (ref 0–19)
Troponin T High Sensitivity: 36 ng/L — ABNORMAL HIGH (ref 0–19)

## 2024-03-15 LAB — FOLATE: Folate: 6.2 ng/mL

## 2024-03-15 LAB — PRO BRAIN NATRIURETIC PEPTIDE: Pro Brain Natriuretic Peptide: 432 pg/mL — ABNORMAL HIGH

## 2024-03-15 LAB — D-DIMER, QUANTITATIVE: D-Dimer, Quant: 0.8 ug{FEU}/mL — ABNORMAL HIGH (ref 0.00–0.50)

## 2024-03-15 LAB — PREPARE RBC (CROSSMATCH)

## 2024-03-15 MED ORDER — PANTOPRAZOLE SODIUM 20 MG PO TBEC: 20.0000 mg | DELAYED_RELEASE_TABLET | Freq: Two times a day (BID) | ORAL | 0 refills | Status: DC

## 2024-03-15 MED ORDER — SODIUM CHLORIDE 0.9% IV SOLUTION: Freq: Once | INTRAVENOUS | Status: AC

## 2024-03-15 MED ORDER — PANTOPRAZOLE SODIUM 40 MG IV SOLR
40.0000 mg | Freq: Once | INTRAVENOUS | Status: AC
  Administered 2024-03-15: 40 mg via INTRAVENOUS
  Filled 2024-03-15: qty 10

## 2024-03-15 MED ORDER — PANTOPRAZOLE SODIUM 20 MG PO TBEC: 20.0000 mg | DELAYED_RELEASE_TABLET | Freq: Two times a day (BID) | ORAL | 0 refills | Status: AC

## 2024-03-15 NOTE — ED Provider Notes (Signed)
 I took over care of this patient at 3:30 PM pending ultimate disposition. She was pending a blood transfusion. Hospitalist and GI were consulted by previous provider and recommended transfusion here and outpatient follow up.  Physical Exam  BP (!) 172/91 (BP Location: Left Arm)   Pulse 79   Temp 98 F (36.7 C) (Oral)   Resp 17   Ht 5' 5 (1.651 m)   Wt 72.2 kg   SpO2 98%   BMI 26.49 kg/m   Physical Exam Vitals and nursing note reviewed.  Constitutional:      General: She is not in acute distress.    Appearance: She is well-developed.  HENT:     Head: Normocephalic and atraumatic.  Eyes:     Conjunctiva/sclera: Conjunctivae normal.  Cardiovascular:     Rate and Rhythm: Normal rate and regular rhythm.     Heart sounds: No murmur heard. Pulmonary:     Effort: Pulmonary effort is normal. No respiratory distress.     Breath sounds: Normal breath sounds.  Abdominal:     Palpations: Abdomen is soft.     Tenderness: There is no abdominal tenderness.  Musculoskeletal:        General: No swelling.     Cervical back: Neck supple.  Skin:    General: Skin is warm and dry.     Capillary Refill: Capillary refill takes less than 2 seconds.  Neurological:     Mental Status: She is alert.  Psychiatric:        Mood and Affect: Mood normal.     Procedures  Procedures  ED Course / MDM    Medical Decision Making Patient feeling better after blood transfusion. Vitals are stable. She would like to go home and will plan to follow up with GI. Recommended return for any new or worsening symptoms. Patient's husband and her feel comfortable with this plan.   Problems Addressed: Other iron deficiency anemia: undiagnosed new problem with uncertain prognosis  Amount and/or Complexity of Data Reviewed Labs: ordered. Radiology: ordered. ECG/medicine tests: ordered.  Risk OTC drugs. Prescription drug management. Drug therapy requiring intensive monitoring for toxicity. Diagnosis or  treatment significantly limited by social determinants of health.          Gennaro Duwaine CROME, DO 03/15/24 2257

## 2024-03-15 NOTE — ED Triage Notes (Signed)
 Pt arrived via [facility] from the [facility] c/o feeling SOB at times for the past week. Pt presents in NAD, and EMS report Pts O2 Sats 96% on room air.

## 2024-03-15 NOTE — ED Notes (Signed)
 ED Provider at bedside.

## 2024-03-15 NOTE — ED Notes (Signed)
 Pt provided bedside commode at this time. Pt requires minimal assistance with transfer.

## 2024-03-15 NOTE — Discharge Instructions (Addendum)
 Follow-up with your family doctor within a week to get your blood level checked.  Follow-up with the gastroenterologist Dr. [name] in [place] or see one of his associates here in [place].  You should be seen within the next couple weeks by them return if any problems

## 2024-03-15 NOTE — ED Notes (Signed)
 Patient transported to X-ray

## 2024-03-15 NOTE — ED Provider Notes (Signed)
 " [place] [facility] AT [facility] Provider Note   CSN: [id number] Arrival date & time: 03/15/24  9088     Patient presents with: Shortness of Breath   [name] is a 84 y.o. female.   Patient has a history of hypertension and renal disease.  She complains of shortness of breath for over a week now.  No fevers no chills  The history is provided by the patient and medical records. No language interpreter was used.  Shortness of Breath Severity:  Moderate Onset quality:  Sudden Timing:  Constant Progression:  Waxing and waning Chronicity:  New Context: activity   Relieved by:  Nothing Worsened by:  Nothing Ineffective treatments:  None tried Associated symptoms: no abdominal pain, no chest pain, no cough, no headaches and no rash        Prior to Admission medications  Medication Sig Start Date End Date Taking? Authorizing Provider  acetaminophen  (TYLENOL ) 325 MG tablet Take 650 mg by mouth every 6 (six) hours as needed for moderate pain (pain score 4-6). 02/01/20  Yes [provider]  ALPRAZolam  (XANAX ) 0.25 MG tablet Take 0.25 mg by mouth daily.   Yes [provider]  amLODipine (NORVASC) 5 MG tablet Take 5 mg by mouth daily. 12/19/19  Yes [provider]  Artificial Saliva (ACT DRY MOUTH) LOZG Use as directed 1 tablet in the mouth or throat as needed (dry mouth).   Yes [provider]  Cholecalciferol  (VITAMIN D -3 PO) Take 1,000 Units by mouth daily.  01/24/20  Yes [provider]  famotidine (PEPCID) 20 MG tablet Take 20 mg by mouth as needed for heartburn or indigestion.   Yes [provider]  famotidine (PEPCID) 40 MG tablet Take 40 mg by mouth daily.   Yes [provider]  lamoTRIgine (LAMICTAL) 25 MG tablet Take 25 mg by mouth 2 (two) times daily.   Yes [provider]  mirtazapine (REMERON) 7.5 MG tablet Take 7.5 mg by mouth at bedtime. 03/05/24  Yes [provider]  polyethylene glycol (MIRALAX  / GLYCOLAX ) 17 g packet Take 17 g by mouth daily. 01/25/20  Yes Jadine Toribio SQUIBB, MD  propranolol  (INDERAL ) 20 MG tablet Take 10 mg by mouth daily. 01/06/14  Yes [provider]  rosuvastatin (CRESTOR) 10 MG tablet Take 10 mg by mouth at bedtime.   Yes [provider]  triamcinolone (KENALOG) 0.025 % ointment Apply 1 Application topically 2 (two) times daily. 04/15/23  Yes [provider]  Estradiol (VAGIFEM) 10 MCG TABS vaginal tablet Place 10 mcg vaginally once a week.  Patient not taking: Reported on 03/15/2024    [provider]    Allergies: Amoxicillin, Clindamycin/lincomycin, Morphine  and codeine, Other, Oxycodone , and Sertraline hcl    Review of Systems  Constitutional:  Negative for appetite change and fatigue.  HENT:  Negative for congestion, ear discharge and sinus pressure.   Eyes:  Negative for discharge.  Respiratory:  Positive for shortness of breath. Negative for cough.   Cardiovascular:  Negative for chest pain.  Gastrointestinal:  Negative for abdominal pain and diarrhea.  Genitourinary:  Negative for frequency and hematuria.  Musculoskeletal:  Negative for back pain.  Skin:  Negative for rash.  Neurological:  Negative for seizures and headaches.  Psychiatric/Behavioral:  Negative for hallucinations.     Updated Vital Signs BP (!) 169/81   Pulse 77   Temp 97.7 F (36.5 C) (Oral)   Resp 17   Ht 5'  5 (1.651 m)   Wt 72.2 kg   SpO2 99%   BMI 26.49 kg/m   Physical Exam Vitals and nursing note reviewed.  Constitutional:      Appearance: She is well-developed.  HENT:     Head: Normocephalic.     Nose: Nose normal.  Eyes:     General: No scleral icterus.    Conjunctiva/sclera: Conjunctivae normal.  Neck:     Thyroid : No thyromegaly.  Cardiovascular:     Rate and Rhythm: Normal rate and regular rhythm.     Heart sounds: No murmur heard.    No friction rub. No gallop.  Pulmonary:      Breath sounds: No stridor. No wheezing or rales.  Chest:     Chest wall: No tenderness.  Abdominal:     General: There is no distension.     Tenderness: There is no abdominal tenderness. There is no rebound.  Genitourinary:    Comments: Rectal exam done.  No stool in vault Musculoskeletal:        General: Normal range of motion.     Cervical back: Neck supple.  Lymphadenopathy:     Cervical: No cervical adenopathy.  Skin:    Findings: No erythema or rash.  Neurological:     Mental Status: She is alert and oriented to person, place, and time.     Motor: No abnormal muscle tone.     Coordination: Coordination normal.  Psychiatric:        Behavior: Behavior normal.     (all labs ordered are listed, but only abnormal results are displayed) Labs Reviewed  CBC WITH DIFFERENTIAL/PLATELET - Abnormal; Notable for the following components:      Result Value   RBC 2.69 (*)    Hemoglobin 7.1 (*)    HCT 25.0 (*)    MCHC 28.4 (*)    All other components within normal limits  COMPREHENSIVE METABOLIC PANEL WITH GFR - Abnormal; Notable for the following components:   BUN 25 (*)    Creatinine, Ser 1.77 (*)    GFR, Estimated 28 (*)    All other components within normal limits  D-DIMER, QUANTITATIVE - Abnormal; Notable for the following components:   D-Dimer, Quant 0.80 (*)    All other components within normal limits  PRO BRAIN NATRIURETIC PEPTIDE - Abnormal; Notable for the following components:   Pro Brain Natriuretic Peptide 432.0 (*)    All other components within normal limits  URINALYSIS, ROUTINE W REFLEX MICROSCOPIC - Abnormal; Notable for the following components:   Color, Urine COLORLESS (*)    Specific Gravity, Urine 1.003 (*)    Hgb urine dipstick SMALL (*)    Leukocytes,Ua TRACE (*)    Bacteria, UA RARE (*)    All other components within normal limits  TROPONIN T, HIGH SENSITIVITY - Abnormal; Notable for the following components:   Troponin T High Sensitivity 38 (*)     All other components within normal limits  TROPONIN T, HIGH SENSITIVITY - Abnormal; Notable for the following components:   Troponin T High Sensitivity 36 (*)    All other components within normal limits  RESP PANEL BY RT-PCR (RSV, FLU A&B, COVID)  RVPGX2  PREPARE RBC (CROSSMATCH)  TYPE AND SCREEN    EKG: EKG Interpretation Date/Time:  Tuesday March 15 2024 09:29:56 EST Ventricular Rate:  85 PR Interval:  186 QRS Duration:  85 QT Interval:  350 QTC Calculation: 409 R Axis:   -46  Text Interpretation: Sinus or ectopic  atrial rhythm LAD, consider left anterior fascicular block Confirmed by Suzette Pac [phone]) on 03/15/2024 2:42:26 PM  Radiology: DG Chest 2 View Result Date: 03/15/2024 EXAM: 2 VIEW(S) XRAY OF THE CHEST 03/15/2024 09:54:15 AM COMPARISON: 01/30/2022 CLINICAL HISTORY: Shortness of breath. FINDINGS: LUNGS AND PLEURA: Right infrahilar opacities. Bibasilar bandlike densities favoring atelectasis. Moderate hiatal hernia. No pleural effusion. No pneumothorax. HEART AND MEDIASTINUM: Aortic atherosclerotic calcification. No acute abnormality of the cardiac and mediastinal silhouettes. BONES AND SOFT TISSUES: Multilevel degenerative changes. 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 No acute osseous abnormality. IMPRESSION: 1. Right infrahilar opacities and bibasilar atelectasis. 2. Moderate hiatal hernia. 3. Aortic atherosclerotic calcification. 4. 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 Electronically signed by: Ryan Salvage MD 03/15/2024 10:18 AM EST RP Workstation: [id number]     Procedures   Medications Ordered in the ED  0.9 %  sodium chloride  infusion (Manually program via Guardrails IV Fluids) ( Intravenous New Bag/Given 03/15/24 1508)  Patient with anemia causing her shortness of breath.  She will be transfused 1 unit of packed red blood cells.  I spoke with the hospitalist Dr. Ricky and he is going to speak with GI to see if they  feel like the patient needs to be admitted.   CRITICAL CARE Performed by: Pac Suzette Total critical care time: 45 minutes Critical care time was exclusive of separately billable procedures and treating other patients. Critical care was necessary to treat or prevent imminent or life-threatening deterioration. Critical care was time spent personally by me on the following activities: development of treatment plan with patient and/or surrogate as well as nursing, discussions with consultants, evaluation of patient's response to treatment, examination of patient, obtaining history from patient or surrogate, ordering and performing treatments and interventions, ordering and review of laboratory studies, ordering and review of radiographic studies, pulse oximetry and re-evaluation of patient's condition.                                Medical Decision Making Amount and/or Complexity of Data Reviewed Labs: ordered. Radiology: ordered. ECG/medicine tests: ordered.  Risk Prescription drug management.  Shortness of breath secondary to anemia.  Patient getting transfused 1 unit of packed red blood cells.  Disposition will be determined by my colleague     Final diagnoses:  None    ED Discharge Orders     None          Suzette Pac, MD 03/15/24 1701  "

## 2024-03-16 LAB — TYPE AND SCREEN
ABO/RH(D): O POS
Antibody Screen: NEGATIVE
Unit division: 0

## 2024-03-16 LAB — BPAM RBC
Blood Product Expiration Date: 202601282359
ISSUE DATE / TIME: 202601061613
Unit Type and Rh: 5100

## 2024-04-04 ENCOUNTER — Encounter (HOSPITAL_COMMUNITY): Payer: Self-pay

## 2024-04-04 ENCOUNTER — Other Ambulatory Visit: Payer: Self-pay

## 2024-04-04 ENCOUNTER — Observation Stay (HOSPITAL_COMMUNITY)
Admission: EM | Admit: 2024-04-04 | Discharge: 2024-04-05 | Disposition: A | Source: Skilled Nursing Facility | Attending: Internal Medicine | Admitting: Internal Medicine

## 2024-04-04 ENCOUNTER — Emergency Department (HOSPITAL_COMMUNITY)

## 2024-04-04 DIAGNOSIS — R2681 Unsteadiness on feet: Secondary | ICD-10-CM | POA: Diagnosis not present

## 2024-04-04 DIAGNOSIS — N184 Chronic kidney disease, stage 4 (severe): Secondary | ICD-10-CM | POA: Diagnosis not present

## 2024-04-04 DIAGNOSIS — Z79899 Other long term (current) drug therapy: Secondary | ICD-10-CM | POA: Diagnosis not present

## 2024-04-04 DIAGNOSIS — R531 Weakness: Secondary | ICD-10-CM | POA: Insufficient documentation

## 2024-04-04 DIAGNOSIS — F319 Bipolar disorder, unspecified: Secondary | ICD-10-CM | POA: Insufficient documentation

## 2024-04-04 DIAGNOSIS — Z87891 Personal history of nicotine dependence: Secondary | ICD-10-CM | POA: Insufficient documentation

## 2024-04-04 DIAGNOSIS — R0602 Shortness of breath: Principal | ICD-10-CM | POA: Diagnosis present

## 2024-04-04 DIAGNOSIS — I129 Hypertensive chronic kidney disease with stage 1 through stage 4 chronic kidney disease, or unspecified chronic kidney disease: Secondary | ICD-10-CM | POA: Insufficient documentation

## 2024-04-04 DIAGNOSIS — D509 Iron deficiency anemia, unspecified: Secondary | ICD-10-CM | POA: Insufficient documentation

## 2024-04-04 DIAGNOSIS — D649 Anemia, unspecified: Principal | ICD-10-CM | POA: Insufficient documentation

## 2024-04-04 LAB — CBC WITH DIFFERENTIAL/PLATELET
Abs Immature Granulocytes: 0.05 10*3/uL (ref 0.00–0.07)
Basophils Absolute: 0 10*3/uL (ref 0.0–0.1)
Basophils Relative: 1 %
Eosinophils Absolute: 0.1 10*3/uL (ref 0.0–0.5)
Eosinophils Relative: 1 %
HCT: 28.5 % — ABNORMAL LOW (ref 36.0–46.0)
Hemoglobin: 8.4 g/dL — ABNORMAL LOW (ref 12.0–15.0)
Immature Granulocytes: 1 %
Lymphocytes Relative: 11 %
Lymphs Abs: 0.7 10*3/uL (ref 0.7–4.0)
MCH: 25.9 pg — ABNORMAL LOW (ref 26.0–34.0)
MCHC: 29.5 g/dL — ABNORMAL LOW (ref 30.0–36.0)
MCV: 88 fL (ref 80.0–100.0)
Monocytes Absolute: 0.4 10*3/uL (ref 0.1–1.0)
Monocytes Relative: 7 %
Neutro Abs: 5.2 10*3/uL (ref 1.7–7.7)
Neutrophils Relative %: 79 %
Platelets: 241 10*3/uL (ref 150–400)
RBC: 3.24 MIL/uL — ABNORMAL LOW (ref 3.87–5.11)
RDW: 15.2 % (ref 11.5–15.5)
WBC: 6.5 10*3/uL (ref 4.0–10.5)
nRBC: 0 % (ref 0.0–0.2)

## 2024-04-04 LAB — COMPREHENSIVE METABOLIC PANEL WITH GFR
ALT: 9 U/L (ref 0–44)
AST: 16 U/L (ref 15–41)
Albumin: 4.2 g/dL (ref 3.5–5.0)
Alkaline Phosphatase: 79 U/L (ref 38–126)
Anion gap: 13 (ref 5–15)
BUN: 25 mg/dL — ABNORMAL HIGH (ref 8–23)
CO2: 22 mmol/L (ref 22–32)
Calcium: 10.3 mg/dL (ref 8.9–10.3)
Chloride: 106 mmol/L (ref 98–111)
Creatinine, Ser: 1.81 mg/dL — ABNORMAL HIGH (ref 0.44–1.00)
GFR, Estimated: 27 mL/min — ABNORMAL LOW
Glucose, Bld: 102 mg/dL — ABNORMAL HIGH (ref 70–99)
Potassium: 4.2 mmol/L (ref 3.5–5.1)
Sodium: 141 mmol/L (ref 135–145)
Total Bilirubin: 0.3 mg/dL (ref 0.0–1.2)
Total Protein: 7 g/dL (ref 6.5–8.1)

## 2024-04-04 LAB — PRO BRAIN NATRIURETIC PEPTIDE: Pro Brain Natriuretic Peptide: 340 pg/mL — ABNORMAL HIGH

## 2024-04-04 LAB — PROTIME-INR
INR: 1 (ref 0.8–1.2)
Prothrombin Time: 14.2 s (ref 11.4–15.2)

## 2024-04-04 LAB — TROPONIN T, HIGH SENSITIVITY: Troponin T High Sensitivity: 35 ng/L — ABNORMAL HIGH (ref 0–19)

## 2024-04-04 LAB — PREPARE RBC (CROSSMATCH)

## 2024-04-04 MED ORDER — POLYETHYLENE GLYCOL 3350 17 G PO PACK: 17.0000 g | PACK | Freq: Every day | ORAL | Status: DC | PRN

## 2024-04-04 MED ORDER — ALPRAZOLAM 0.25 MG PO TABS
0.2500 mg | ORAL_TABLET | Freq: Once | ORAL | Status: AC | PRN
  Administered 2024-04-04: 0.25 mg via ORAL
  Filled 2024-04-04: qty 1

## 2024-04-04 MED ORDER — PANTOPRAZOLE SODIUM 40 MG PO TBEC
40.0000 mg | DELAYED_RELEASE_TABLET | Freq: Every day | ORAL | Status: DC
  Administered 2024-04-04 – 2024-04-05 (×2): 40 mg via ORAL
  Filled 2024-04-04 (×2): qty 1

## 2024-04-04 MED ORDER — LAMOTRIGINE 25 MG PO TABS
25.0000 mg | ORAL_TABLET | Freq: Two times a day (BID) | ORAL | Status: DC
  Administered 2024-04-04 – 2024-04-05 (×3): 25 mg via ORAL
  Filled 2024-04-04 (×3): qty 1

## 2024-04-04 MED ORDER — ACETAMINOPHEN 325 MG PO TABS
650.0000 mg | ORAL_TABLET | Freq: Four times a day (QID) | ORAL | Status: DC | PRN
  Administered 2024-04-04: 650 mg via ORAL
  Filled 2024-04-04: qty 2

## 2024-04-04 MED ORDER — ACETAMINOPHEN 650 MG RE SUPP: 650.0000 mg | Freq: Four times a day (QID) | RECTAL | Status: DC | PRN

## 2024-04-04 MED ORDER — ONDANSETRON HCL 4 MG PO TABS: 4.0000 mg | ORAL_TABLET | Freq: Four times a day (QID) | ORAL | Status: DC | PRN

## 2024-04-04 MED ORDER — SODIUM CHLORIDE 0.9% IV SOLUTION: Freq: Once | INTRAVENOUS | Status: AC

## 2024-04-04 MED ORDER — ONDANSETRON HCL 4 MG/2ML IJ SOLN: 4.0000 mg | Freq: Four times a day (QID) | INTRAMUSCULAR | Status: DC | PRN

## 2024-04-04 MED ORDER — MELATONIN 3 MG PO TABS
3.0000 mg | ORAL_TABLET | Freq: Every evening | ORAL | Status: DC | PRN
  Administered 2024-04-04: 3 mg via ORAL
  Filled 2024-04-04: qty 1

## 2024-04-04 MED ORDER — AMLODIPINE BESYLATE 5 MG PO TABS
5.0000 mg | ORAL_TABLET | Freq: Every day | ORAL | Status: DC
  Administered 2024-04-04 – 2024-04-05 (×2): 5 mg via ORAL
  Filled 2024-04-04 (×2): qty 1

## 2024-04-04 MED ORDER — MIRTAZAPINE 15 MG PO TABS
7.5000 mg | ORAL_TABLET | Freq: Every day | ORAL | Status: DC
  Administered 2024-04-04: 7.5 mg via ORAL
  Filled 2024-04-04: qty 1

## 2024-04-04 MED ORDER — ROSUVASTATIN CALCIUM 10 MG PO TABS
10.0000 mg | ORAL_TABLET | Freq: Every day | ORAL | Status: DC
  Administered 2024-04-04: 10 mg via ORAL
  Filled 2024-04-04: qty 1

## 2024-04-04 MED ORDER — PROPRANOLOL HCL 20 MG PO TABS
10.0000 mg | ORAL_TABLET | Freq: Every day | ORAL | Status: DC
  Administered 2024-04-04 – 2024-04-05 (×2): 10 mg via ORAL
  Filled 2024-04-04 (×2): qty 1

## 2024-04-04 MED ORDER — FUROSEMIDE 10 MG/ML IJ SOLN
20.0000 mg | Freq: Once | INTRAMUSCULAR | Status: AC
  Administered 2024-04-04: 20 mg via INTRAVENOUS
  Filled 2024-04-04: qty 2

## 2024-04-04 NOTE — ED Notes (Signed)
 Pt ambulated to the bathroom; O2 100 with ambulation, no ShOB, no complaints. 1 person assist necessary for stability, typical of age related functional decline, otherwise stable.

## 2024-04-04 NOTE — Progress Notes (Deleted)
" °   04/04/24 1349  TOC Brief Assessment  Insurance and Status Reviewed  Patient has primary care physician Yes  Home environment has been reviewed From Home w/ spouse  Prior level of function: Independent  Prior/Current Home Services No current home services  Social Drivers of Health Review SDOH reviewed no interventions necessary  Readmission risk has been reviewed Yes  Transition of care needs no transition of care needs at this time    Inpatient Care Management (ICM) has reviewed patient and no other ICM needs have been identified at this time. We will continue to monitor patient advancement through interdisciplinary progression rounds. If new patient transition needs arise, please place a ICM consult.  "

## 2024-04-04 NOTE — ED Provider Notes (Signed)
 " [place] EMERGENCY DEPARTMENT AT [facility] Provider Note   CSN: [id number] Arrival date & time: 04/04/24  9168     Patient presents with: Shortness of Breath   [name] is a 84 y.o. female.    Shortness of Breath Patient presents with shortness of breath.  Reportedly from the [facility] [place].  Reportedly per EMS report had hemoglobin of 11.3 and now 7.2.  Although reviewing records it appears that her hemoglobin was 11 a year ago.  2 weeks ago was seen in the ER with a hemoglobin of 7.1 and transfused a unit.  Unsure if the hemoglobin has been rechecked since.  Patient states she needs to come into the hospital for 2 or 3 days to get back on her feet.     Prior to Admission medications  Medication Sig Start Date End Date Taking? Authorizing Provider  acetaminophen  (TYLENOL ) 325 MG tablet Take 650 mg by mouth every 6 (six) hours as needed for moderate pain (pain score 4-6). 02/01/20   [provider]  ALPRAZolam  (XANAX ) 0.25 MG tablet Take 0.25 mg by mouth daily.    [provider]  amLODipine  (NORVASC ) 5 MG tablet Take 5 mg by mouth daily. 12/19/19   [provider]  Artificial Saliva (ACT DRY MOUTH) LOZG Use as directed 1 tablet in the mouth or throat as needed (dry mouth).    [provider]  Cholecalciferol  (VITAMIN D -3 PO) Take 1,000 Units by mouth daily.  01/24/20   [provider]  Estradiol (VAGIFEM) 10 MCG TABS vaginal tablet Place 10 mcg vaginally once a week.  Patient not taking: Reported on 03/15/2024    [provider]  famotidine (PEPCID) 20 MG tablet Take 20 mg by mouth as needed for heartburn or indigestion.    [provider]  famotidine (PEPCID) 40 MG tablet Take 40 mg by mouth daily.    [provider]  lamoTRIgine  (LAMICTAL ) 25 MG tablet Take 25 mg by mouth 2 (two) times daily.    [provider]  mirtazapine  (REMERON ) 7.5 MG tablet Take 7.5 mg by mouth at  bedtime. 03/05/24   [provider]  pantoprazole  (PROTONIX ) 20 MG tablet Take 1 tablet (20 mg total) by mouth 2 (two) times daily. 03/15/24   Kammerer, Megan L, DO  polyethylene glycol (MIRALAX  / GLYCOLAX ) 17 g packet Take 17 g by mouth daily. 01/25/20   Jadine Toribio SQUIBB, MD  propranolol  (INDERAL ) 20 MG tablet Take 10 mg by mouth daily. 01/06/14   [provider]  rosuvastatin  (CRESTOR ) 10 MG tablet Take 10 mg by mouth at bedtime.    [provider]  triamcinolone (KENALOG) 0.025 % ointment Apply 1 Application topically 2 (two) times daily. 04/15/23   [provider]    Allergies: Amoxicillin, Clindamycin/lincomycin, Morphine  and codeine, Other, Oxycodone , and Sertraline hcl    Review of Systems  Respiratory:  Positive for shortness of breath.     Updated Vital Signs BP (!) 171/91   Pulse 83   Temp 98.3 F (36.8 C) (Oral)   Resp 20   Ht 5' 5 (1.651 m)   Wt 72.2 kg   SpO2 94%   BMI 26.49 kg/m   Physical Exam Vitals and nursing note reviewed.  Pulmonary:     Breath sounds: No wheezing or rhonchi.  Musculoskeletal:     Right lower leg: No tenderness.     Left lower leg: No tenderness.  Skin:    Capillary Refill:  Capillary refill takes less than 2 seconds.  Neurological:     Mental Status: She is alert.     (all labs ordered are listed, but only abnormal results are displayed) Labs Reviewed  COMPREHENSIVE METABOLIC PANEL WITH GFR - Abnormal; Notable for the following components:      Result Value   Glucose, Bld 102 (*)    BUN 25 (*)    Creatinine, Ser 1.81 (*)    GFR, Estimated 27 (*)    All other components within normal limits  CBC WITH DIFFERENTIAL/PLATELET - Abnormal; Notable for the following components:   RBC 3.24 (*)    Hemoglobin 8.4 (*)    HCT 28.5 (*)    MCH 25.9 (*)    MCHC 29.5 (*)    All other components within normal limits  PROTIME-INR  TYPE AND SCREEN    EKG: None  Radiology: DG Chest Portable 1  View Result Date: 04/04/2024 EXAM: 1 VIEW(S) XRAY OF THE CHEST 04/04/2024 09:53:00 AM COMPARISON: 03/15/2024 CLINICAL HISTORY: Shortness of breath. FINDINGS: LUNGS AND PLEURA: Low lung volumes. Diffuse interstitial opacities. Left basilar patchy opacities. No pleural effusion. No pneumothorax. HEART AND MEDIASTINUM: Atherosclerotic calcifications. No acute abnormality of the cardiac and mediastinal silhouettes. BONES AND SOFT TISSUES: Degenerative changes of the left shoulder. IMPRESSION: 1. Low lung volumes with diffuse interstitial opacities which are favored to represent mild edema. 2. Left basilar patchy opacities, atelectasis or airspace disease. Electronically signed by: Waddell Calk MD 04/04/2024 10:12 AM EST RP Workstation: [id number]     Procedures   Medications Ordered in the ED - No data to display                                  Medical Decision Making Amount and/or Complexity of Data Reviewed Labs: ordered. Radiology: ordered.   Patient reportedly sent in for anemia.  Recently seen for same and is on iron supplements.  Somewhat unclear story however.  Will get blood work today.  Reviewed recent ER visit.  Hemoglobin actually improved from prior.  Was able to ambulate in the ER.  Also states short of breath.  States she has been able to manage at home because she gets too short of breath.  No cough.  Hemoglobin stable from likely hemoglobin after transfusion.  With more short of breath unable to manage at home I think patient may benefit from admission to the hospital for further workup.  Did have recent ER workup for same.  Will discuss with hospitalist.  Discussed with hospitalist, who will see patient.     Final diagnoses:  Anemia, unspecified type    ED Discharge Orders     None          Patsey Lot, MD 04/04/24 1131  "

## 2024-04-04 NOTE — ED Triage Notes (Signed)
 Pt BIB [facility] from the [facility]. Pt states it's from her hemoglobin initially 11.3 and now 7.2 pt is taking iron supplements.  BP 156/97, 87, 97% RA, 98.2 oral temp. Per EMS lung sounds clear.

## 2024-04-04 NOTE — H&P (Addendum)
 "  History and Physical:    [name]   FMW:[id number] DOB: [birth year] DOA: 04/04/2024  Referring MD/provider: Rankin River, MD PCP: [name], MD   Patient coming from: Independent living facility  Chief Complaint: Shortness of breath  History of Present Illness:   [name] is a 84 y.o. female with medical history significant for CKD stage IV, bipolar disorder, depression, hypertension, hypertension, chronic tubular adenoma, who presented to the hospital because of shortness of breath.  She says she has been feeling short of breath for about 3 weeks now daughter she also said she is not exactly sure when it started.  She was seen in the ED on 03/15/2024 for shortness of breath. At that time, she was noted to have a hemoglobin of 7.1.  She was transfused with 1 unit of PRBCs and was discharged from the ED with plan to follow-up with gastroenterologist, Dr. [name], as an outpatient on 04/14/2024.  However, she noticed worsening shortness of breath so she came back to the ED on 04/04/2024.[name] She said she could not sleep the whole night and it was really really scary.   She also feels very weak and tired. She is convinced that she has to be admitted for a few days before going back to the facility.  No orthopnea or PND, chest pain, palpitations, dizziness, abdominal leg swelling, cough, wheezing, fever, chills, vomiting, abdominal pain or diarrhea     ED Course: Vital signs in the ED temperature 98.3 F, respiratory rate 20, pulse 82, BP 142/101, oxygen saturation 96% on room air.  Significant lab findings BUN 25, creatinine 1.81, GFR 27, hemoglobin 8.4, hematocrit 28.5    Chest x-ray IMPRESSION: 1. Low lung volumes with diffuse interstitial opacities which are favored to represent mild edema. 2. Left basilar patchy opacities, atelectasis or airspace disease.     ROS:   ROS all other systems reviewed were negative  Past Medical History:   Past Medical History:   Diagnosis Date   Bipolar affective disorder (HCC)    CKD (chronic kidney disease)    Depression    Diverticulosis of colon 07/29/2007   Hypertension    Hypotension    Tubular adenoma of colon 07/29/2007   Dr. Denzil colonoscopy, previous focally adenomatous cecal polyp 2006    Past Surgical History:   Past Surgical History:  Procedure Laterality Date   COLONOSCOPY  2012   Dr. Shaaron: Tubular adenoma, diverticulosis.   COLONOSCOPY N/A 08/01/2015   Procedure: COLONOSCOPY;  Surgeon: [name], MD;  Location: [facility];  Service: Endoscopy;  Laterality: N/A;  1345   KNEE ARTHROSCOPY     spinal tumor removal     TOTAL KNEE ARTHROPLASTY      Social History:   Social History   Socioeconomic History   Marital status: Married    Spouse name: Not on file   Number of children: 2   Years of education: Not on file   Highest education level: Not on file  Occupational History   Occupation: retired  Tobacco Use   Smoking status: Former    Types: Cigarettes    Passive exposure: Past   Smokeless tobacco: Never  Vaping Use   Vaping status: Never Used  Substance and Sexual Activity   Alcohol use: No   Drug use: No   Sexual activity: Not Currently  Other Topics Concern   Not on file  Social History Narrative   Not on file   Social Drivers of  Health   Tobacco Use: Medium Risk (04/04/2024)   Patient History    Smoking Tobacco Use: Former    Smokeless Tobacco Use: Never    Passive Exposure: Past  Physicist, Medical Strain: Low Risk (04/28/2023)   Received from [facility]   Overall Financial Resource Strain (CARDIA)    Difficulty of Paying Living Expenses: Not hard at all  Food Insecurity: No Food Insecurity (04/28/2023)   Received from [facility]   Epic    Within the past 12 months, you worried that your food would run out before you got the money to buy more.: Never true    Within the past 12 months, the food you bought just didn't last and you didn't have  money to get more.: Never true  Transportation Needs: No Transportation Needs (04/28/2023)   Received from [facility] - Transportation    Lack of Transportation (Medical): No    Lack of Transportation (Non-Medical): No  Physical Activity: Unknown (05/12/2022)   Received from [facility]   Exercise Vital Sign    On average, how many days per week do you engage in moderate to strenuous exercise (like a brisk walk)?: 0 days    Minutes of Exercise per Session: Not on file  Stress: Stress Concern Present (05/12/2022)   Received from [facility] [employer] [facility] - Occupational Stress Questionnaire    Feeling of Stress : To some extent  Social Connections: Socially Integrated (05/12/2022)   Received from [facility]   Social Network    How would you rate your social network (family, work, friends)?: Good participation with social networks  Intimate Partner Violence: Not At Risk (05/12/2022)   Received from [facility]   HITS    Over the last 12 months how often did your partner physically hurt you?: Never    Over the last 12 months how often did your partner insult you or talk down to you?: Never    Over the last 12 months how often did your partner threaten you with physical harm?: Never    Over the last 12 months how often did your partner scream or curse at you?: Never  Depression (PHQ2-9): Low Risk (02/13/2023)   Depression (PHQ2-9)    PHQ-2 Score: 0  Alcohol Screen: Not on file  Housing: Low Risk (04/28/2023)   Received from [facility]    In the last 12 months, was there a time when you were not able to pay the mortgage or rent on time?: No    In the past 12 months, how many times have you moved where you were living?: 1    At any time in the past 12 months, were you homeless or living in a shelter (including now)?: No  Utilities: Not At Risk (04/28/2023)   Received from [facility] Utilities    Threatened with loss of  utilities: No  Health Literacy: Not on file    Allergies   Amoxicillin, Clindamycin/lincomycin, Morphine  and codeine, Other, Oxycodone , and Sertraline hcl  Family history:   Family History  Problem Relation Age of Onset   Kidney disease Mother    Colitis Other        ? type in sibling   Pancreatic cancer Daughter        DX. at 52   Colon cancer Neg Hx     Current Medications:   Prior to Admission medications  Medication Sig Start Date  End Date Taking? Authorizing Provider  estradiol (ESTRACE) 0.01 % CREA vaginal cream Place 1 g vaginally once a week. 02/09/24  Yes [provider]  acetaminophen  (TYLENOL ) 325 MG tablet Take 650 mg by mouth every 6 (six) hours as needed for moderate pain (pain score 4-6). 02/01/20   [provider]  ALPRAZolam  (XANAX ) 0.25 MG tablet Take 0.25 mg by mouth daily.    [provider]  amLODipine  (NORVASC ) 5 MG tablet Take 5 mg by mouth daily. 12/19/19   [provider]  Artificial Saliva (ACT DRY MOUTH) LOZG Use as directed 1 tablet in the mouth or throat as needed (dry mouth).    [provider]  Cholecalciferol  (VITAMIN D -3 PO) Take 1,000 Units by mouth daily.  01/24/20   [provider]  Estradiol (VAGIFEM) 10 MCG TABS vaginal tablet Place 10 mcg vaginally once a week.  Patient not taking: Reported on 03/15/2024    [provider]  famotidine (PEPCID) 20 MG tablet Take 20 mg by mouth as needed for heartburn or indigestion.    [provider]  famotidine (PEPCID) 40 MG tablet Take 40 mg by mouth daily.    [provider]  lamoTRIgine  (LAMICTAL ) 25 MG tablet Take 25 mg by mouth 2 (two) times daily.    [provider]  mirtazapine  (REMERON ) 7.5 MG tablet Take 7.5 mg by mouth at bedtime. 03/05/24   [provider]  pantoprazole  (PROTONIX ) 20 MG tablet Take 1 tablet (20 mg total) by mouth 2 (two) times daily. 03/15/24   Kammerer, Megan L, DO  polyethylene glycol  (MIRALAX  / GLYCOLAX ) 17 g packet Take 17 g by mouth daily. 01/25/20   Jadine Toribio SQUIBB, MD  propranolol  (INDERAL ) 20 MG tablet Take 10 mg by mouth daily. 01/06/14   [provider]  rosuvastatin  (CRESTOR ) 10 MG tablet Take 10 mg by mouth at bedtime.    [provider]  triamcinolone (KENALOG) 0.025 % ointment Apply 1 Application topically 2 (two) times daily. 04/15/23   [provider]    Physical Exam:   Vitals:   04/04/24 0915 04/04/24 0930 04/04/24 1015 04/04/24 1027  BP: (!) 156/90 (!) 155/88 (!) 171/91   Pulse: 80 79 83   Resp: 18 13 20    Temp:    98.3 F (36.8 C)  TempSrc:    Oral  SpO2: 97% 97% 94%   Weight:      Height:         Physical Exam: Blood pressure (!) 171/91, pulse 83, temperature 98.3 F (36.8 C), temperature source Oral, resp. rate 20, height 5' 5 (1.651 m), weight 72.2 kg, SpO2 94%. Gen: No acute distress. Head: Normocephalic, atraumatic. Eyes: Pupils equal, round and reactive to light. Extraocular movements intact.  Sclerae nonicteric.  Mouth: Moist mucous membranes Neck: Supple, no thyromegaly, no lymphadenopathy, no jugular venous distention. Chest: Bibasilar rales.  No wheezing or rhonchi CV: Heart sounds are regular with an S1, S2. No murmurs, rubs or gallops.  Abdomen: Soft, nontender, nondistended with normal active bowel sounds. No palpable masses. Extremities: Extremities are without clubbing, or cyanosis. No edema. Pedal pulses 2+.  Skin: Warm and dry.  No lesions or wounds. Neuro: Alert and oriented times 3; grossly nonfocal.  Psych: Insight is good and judgment is appropriate. Mood and affect normal.   Data Review:    Labs: Basic Metabolic Panel: Recent Labs  Lab 04/04/24 0922  NA 141  K 4.2  CL 106  CO2 22  GLUCOSE  102*  BUN 25*  CREATININE 1.81*  CALCIUM  10.3   Liver Function Tests: Recent Labs  Lab 04/04/24 0922  AST 16  ALT 9  ALKPHOS 79  BILITOT 0.3  PROT 7.0  ALBUMIN 4.2   No results  for input(s): LIPASE, AMYLASE in the last 168 hours. No results for input(s): AMMONIA in the last 168 hours. CBC: Recent Labs  Lab 04/04/24 0922  WBC 6.5  NEUTROABS 5.2  HGB 8.4*  HCT 28.5*  MCV 88.0  PLT 241   Cardiac Enzymes: No results for input(s): CKTOTAL, CKMB, CKMBINDEX, TROPONINI in the last 168 hours.  BNP (last 3 results) Recent Labs    03/15/24 1016  PROBNP 432.0*   CBG: No results for input(s): GLUCAP in the last 168 hours.  Urinalysis    Component Value Date/Time   COLORURINE COLORLESS (A) 03/15/2024 1101   APPEARANCEUR CLEAR 03/15/2024 1101   LABSPEC 1.003 (L) 03/15/2024 1101   PHURINE 7.0 03/15/2024 1101   GLUCOSEU NEGATIVE 03/15/2024 1101   HGBUR SMALL (A) 03/15/2024 1101   BILIRUBINUR NEGATIVE 03/15/2024 1101   KETONESUR NEGATIVE 03/15/2024 1101   PROTEINUR NEGATIVE 03/15/2024 1101   UROBILINOGEN 0.2 03/15/2011 1509   NITRITE NEGATIVE 03/15/2024 1101   LEUKOCYTESUR TRACE (A) 03/15/2024 1101      Radiographic Studies: DG Chest Portable 1 View Result Date: 04/04/2024 EXAM: 1 VIEW(S) XRAY OF THE CHEST 04/04/2024 09:53:00 AM COMPARISON: 03/15/2024 CLINICAL HISTORY: Shortness of breath. FINDINGS: LUNGS AND PLEURA: Low lung volumes. Diffuse interstitial opacities. Left basilar patchy opacities. No pleural effusion. No pneumothorax. HEART AND MEDIASTINUM: Atherosclerotic calcifications. No acute abnormality of the cardiac and mediastinal silhouettes. BONES AND SOFT TISSUES: Degenerative changes of the left shoulder. IMPRESSION: 1. Low lung volumes with diffuse interstitial opacities which are favored to represent mild edema. 2. Left basilar patchy opacities, atelectasis or airspace disease. Electronically signed by: Waddell Calk MD 04/04/2024 10:12 AM EST RP Workstation: [id number]    EKG: EKG was not done in the ED   Assessment/Plan:   Principal Problem:   Shortness of breath    Body mass index is 26.49 kg/m.   Shortness of  breath: Admit to telemetry.  Differential diagnosis include acute CHF.   Obtain baseline EKG.  Obtain 2D echo for further evaluation.   Check proBNP and troponin. Treat with IV Lasix .  Monitor BMP, daily weight and urine output.   Iron deficiency anemia: Hemoglobin is 8.4.  This could be contributing to shortness of breath and general weakness. Transfuse 1 unit of PRBCs.  Discussed risks, benefits and alternatives to blood transfusion.  Patient is agreeable to blood transfusion.  Patient will also be given IV iron infusion.   Hemoglobin was 7.1 on 03/15/2024. Hemoglobin was 12.6 on 04/28/2023. Iron studies on 08/27/2024 showed iron 12, saturation ratio 3, ferritin 10.  No overt GI bleeding at this time.  Recommend outpatient follow-up with Dr. [name], gastroenterologist, as scheduled on 04/14/2024.   Hypertension: Continue amlodipine  and propranolol .   Bipolar disorder: Continue Lamictal  and mirtazapine    General weakness: Consult PT and OT   Case was discussed with Dr. Patsey, ED physician.   Other information:   DVT prophylaxis: SCD  Code Status: DNR.  Patient verbally confirmed that she is DNR.  She also has a signed DNR form. Family Communication: None Disposition Plan: Plan to discharge to independent living facility Consults called: None Admission status: Observation    Liliyana Thobe [facility] Pager: Please check [email or web address]   How to contact the [facility] Attending  or Consulting provider 7A - 7P or covering provider during after hours 7P -7A, for this patient?   Check the care team in [facility] and look for a) attending/consulting [facility] provider listed and b) the [facility] team listed Log into [email or web address] and use [place]'s universal password to access. If you do not have the password, please contact the hospital operator. Locate the [facility] provider you are looking for under [facility] and page to a number that you can be directly reached. If you still have  difficulty reaching the provider, please page the [facility] (Director on Call) for the Hospitalists listed on amion for assistance.  04/04/2024, 12:02 PM  "

## 2024-04-04 NOTE — Progress Notes (Signed)
" °   04/04/24 1359  TOC Brief Assessment  Insurance and Status Reviewed  Patient has primary care physician Yes  Home environment has been reviewed [facility]- Independent side  Prior level of function: Independent  Prior/Current Home Services No current home services  Social Drivers of Health Review SDOH reviewed no interventions necessary  Readmission risk has been reviewed Yes  Transition of care needs no transition of care needs at this time   CSW spoke with patient spouse. Spouse reports that [facility] and been [facility] a year now . He reports that she is independent with her ADL's and uses a walker. He also reports that he takes her to her appointment. Spouse is able to provide transportation on [place] , but did say that he was not able to get out of his driveway today.  "

## 2024-04-04 NOTE — ED Notes (Signed)
 Tele monitoring started ccmd called

## 2024-04-05 ENCOUNTER — Observation Stay (HOSPITAL_COMMUNITY)

## 2024-04-05 DIAGNOSIS — R0609 Other forms of dyspnea: Secondary | ICD-10-CM | POA: Diagnosis not present

## 2024-04-05 DIAGNOSIS — D649 Anemia, unspecified: Secondary | ICD-10-CM | POA: Diagnosis not present

## 2024-04-05 DIAGNOSIS — R0602 Shortness of breath: Secondary | ICD-10-CM | POA: Diagnosis not present

## 2024-04-05 LAB — ECHOCARDIOGRAM COMPLETE
Area-P 1/2: 3.58 cm2
Calc EF: 67.9 %
Height: 65 in
S' Lateral: 1.7 cm
Single Plane A2C EF: 68.5 %
Single Plane A4C EF: 66.7 %
Weight: 2557.34 [oz_av]

## 2024-04-05 LAB — BASIC METABOLIC PANEL WITH GFR
Anion gap: 12 (ref 5–15)
BUN: 21 mg/dL (ref 8–23)
CO2: 26 mmol/L (ref 22–32)
Calcium: 10.4 mg/dL — ABNORMAL HIGH (ref 8.9–10.3)
Chloride: 107 mmol/L (ref 98–111)
Creatinine, Ser: 1.94 mg/dL — ABNORMAL HIGH (ref 0.44–1.00)
GFR, Estimated: 25 mL/min — ABNORMAL LOW
Glucose, Bld: 105 mg/dL — ABNORMAL HIGH (ref 70–99)
Potassium: 3.5 mmol/L (ref 3.5–5.1)
Sodium: 145 mmol/L (ref 135–145)

## 2024-04-05 LAB — TYPE AND SCREEN
ABO/RH(D): O POS
Antibody Screen: NEGATIVE
Unit division: 0

## 2024-04-05 LAB — CBC
HCT: 32.6 % — ABNORMAL LOW (ref 36.0–46.0)
Hemoglobin: 9.8 g/dL — ABNORMAL LOW (ref 12.0–15.0)
MCH: 26.1 pg (ref 26.0–34.0)
MCHC: 30.1 g/dL (ref 30.0–36.0)
MCV: 86.9 fL (ref 80.0–100.0)
Platelets: 252 10*3/uL (ref 150–400)
RBC: 3.75 MIL/uL — ABNORMAL LOW (ref 3.87–5.11)
RDW: 15.9 % — ABNORMAL HIGH (ref 11.5–15.5)
WBC: 6.1 10*3/uL (ref 4.0–10.5)
nRBC: 0 % (ref 0.0–0.2)

## 2024-04-05 LAB — BPAM RBC
Blood Product Expiration Date: 202602102359
ISSUE DATE / TIME: 202601261243
Unit Type and Rh: 5100

## 2024-04-05 LAB — MAGNESIUM: Magnesium: 2.6 mg/dL — ABNORMAL HIGH (ref 1.7–2.4)

## 2024-04-05 MED ORDER — FERROUS SULFATE 325 (65 FE) MG PO TBEC: 325.0000 mg | DELAYED_RELEASE_TABLET | Freq: Every day | ORAL | Status: AC

## 2024-04-05 NOTE — Evaluation (Addendum)
 Physical Therapy Evaluation Patient Details Name: [name] MRN: [id number] DOB: [birth year] Today's Date: 04/05/2024  History of Present Illness  [name] is a 84 y.o. female with medical history significant for CKD stage IV, bipolar disorder, depression, hypertension, hypertension, chronic tubular adenoma, who presented to the hospital because of shortness of breath.  She says she has been feeling short of breath for about 3 weeks now daughter she also said she is not exactly sure when it started.  She was seen in the ED on 03/15/2024 for shortness of breath.  At that time, she was noted to have a hemoglobin of 7.1.  She was transfused with 1 unit of PRBCs and was discharged from the ED with plan to follow-up with gastroenterologist, Dr. [name], as an outpatient on 04/14/2024.  However, she noticed worsening shortness of breath so she came back to the ED on 04/04/2024.[name] She said she could not sleep the whole night and it was really really scary.   She also feels very weak and tired. She is convinced that she has to be admitted for a few days before going back to the facility.   No orthopnea or PND, chest pain, palpitations, dizziness, abdominal leg swelling, cough, wheezing, fever, chills, vomiting, abdominal pain or diarrhea   Clinical Impression  Patient agreeable to PT/OT co-evaluation. Patient reports she ambulates around facility with RW and is independent with ADL, although she receives supervision while completing them. This  date, patient remains independent with bed mobility, and requires CGA/supervision with transfers and ambulation with RW. Patient is able to complete donning socks and shoes modified independent with inc time, and pericare in sitting. Patient is generally weak and unsteady throughout. Patient tolerates sitting in chair at EOS, alarm set call button in reach. Patient will benefit from continued skilled physical therapy acutely and in recommended venue in order to address  current deficits and improve function.        If plan is discharge home, recommend the following: A little help with walking and/or transfers;A little help with bathing/dressing/bathroom;Assist for transportation;Help with stairs or ramp for entrance   Can travel by private vehicle        Equipment Recommendations None recommended by PT  Recommendations for Other Services       Functional Status Assessment Patient has had a recent decline in their functional status and demonstrates the ability to make significant improvements in function in a reasonable and predictable amount of time.     Precautions / Restrictions Precautions Precautions: Fall Recall of Precautions/Restrictions: Intact Restrictions Weight Bearing Restrictions Per Provider Order: No      Mobility  Bed Mobility Overal bed mobility: Independent             General bed mobility comments: HOB flat, no use of railing, no physical assist, inc time required due to labored movement    Transfers Overall transfer level: Needs assistance Equipment used: Rolling walker (2 wheels) Transfers: Sit to/from Stand, Bed to chair/wheelchair/BSC Sit to Stand: Min assist, Contact guard assist   Step pivot transfers: Contact guard assist, Supervision       General transfer comment: STS from bed, toilet and chair during session with RW, min assist intermittently due to general weakness, pt demo slow labored movement throughout    Ambulation/Gait Ambulation/Gait assistance: Contact guard assist, Supervision Gait Distance (Feet): 75 Feet Assistive device: Rolling walker (2 wheels) Gait Pattern/deviations: Step-through pattern, Decreased step length - right, Decreased step length - left, Decreased stride length,  Trunk flexed Gait velocity: Dec     General Gait Details: Pt ambulates in room and halls with CGA/supervision for safety, using RW, no overt LOB but mild unsteadiness, demo slow labored movement  Stairs             Wheelchair Mobility     Tilt Bed    Modified Rankin (Stroke Patients Only)       Balance Overall balance assessment: Needs assistance Sitting-balance support: No upper extremity supported, Feet supported Sitting balance-Leahy Scale: Good Sitting balance - Comments: Seated EOB   Standing balance support: During functional activity, Reliant on assistive device for balance, Bilateral upper extremity supported Standing balance-Leahy Scale: Fair Standing balance comment: with RW             Pertinent Vitals/Pain Pain Assessment Pain Assessment: No/denies pain    Home Living Family/patient expects to be discharged to:: Assisted living                 Home Equipment: Agricultural Consultant (2 wheels)      Prior Function Prior Level of Function : Independent/Modified Independent             Mobility Comments: Pt reports she ambualtes around facility with RW and supervision ADLs Comments: Reports supervision during ADLs but completes them independently     Extremity/Trunk Assessment   Upper Extremity Assessment Upper Extremity Assessment: Defer to OT evaluation    Lower Extremity Assessment Lower Extremity Assessment: Generalized weakness (pt generally weak throughout, slight inc weakness in RLE as compared to LLE, RLE grossly 4-/5 LLE grossly 4/5 MMT, limited by R knee pain)    Cervical / Trunk Assessment Cervical / Trunk Assessment: Kyphotic  Communication   Communication Factors Affecting Communication: Hearing impaired;Other (comment) (pt very HOH)    Cognition Arousal: Alert Behavior During Therapy: WFL for tasks assessed/performed     Following commands: Intact       Cueing Cueing Techniques: Verbal cues, Tactile cues, Visual cues     General Comments      Exercises     Assessment/Plan    PT Assessment Patient needs continued PT services;All further PT needs can be met in the next venue of care  PT Problem List Decreased  strength;Decreased range of motion;Decreased activity tolerance;Decreased balance       PT Treatment Interventions DME instruction;Balance training;Gait training;Functional mobility training;Therapeutic exercise;Therapeutic activities;Patient/family education    PT Goals (Current goals can be found in the Care Plan section)  Acute Rehab PT Goals Patient Stated Goal: Return to ILF PT Goal Formulation: With patient Time For Goal Achievement: 04/08/24 Potential to Achieve Goals: Good    Frequency Min 3X/week     Co-evaluation PT/OT/SLP Co-Evaluation/Treatment: Yes Reason for Co-Treatment: To address functional/ADL transfers PT goals addressed during session: Mobility/safety with mobility         AM-PAC PT 6 Clicks Mobility  Outcome Measure Help needed turning from your back to your side while in a flat bed without using bedrails?: None Help needed moving from lying on your back to sitting on the side of a flat bed without using bedrails?: None Help needed moving to and from a bed to a chair (including a wheelchair)?: A Little Help needed standing up from a chair using your arms (e.g., wheelchair or bedside chair)?: A Little Help needed to walk in hospital room?: A Little Help needed climbing 3-5 steps with a railing? : A Lot 6 Click Score: 19    End of Session Equipment Utilized During  Treatment: Gait belt Activity Tolerance: Patient tolerated treatment well;Patient limited by fatigue Patient left: in chair;with call bell/phone within reach;with chair alarm set Nurse Communication: Mobility status PT Visit Diagnosis: Unsteadiness on feet (R26.81);Muscle weakness (generalized) (M62.81)    Time: 9150-9077 PT Time Calculation (min) (ACUTE ONLY): 33 min   Charges:   PT Evaluation $PT Eval Low Complexity: 1 Low   PT General Charges $$ ACUTE PT VISIT: 1 Visit        12:27 PM, 04/05/24 Gregory Barrick Powell-Butler, PT, DPT [place] with [facility]

## 2024-04-05 NOTE — Discharge Summary (Signed)
 " Physician Discharge Summary   INTISAR CLAUDIO FMW:[id number] DOB: 25-Dec-1940 DOA: 04/04/2024  PCP: [name], MD  Admit date: 04/04/2024 Discharge date: 04/05/2024  Admitted From: Home Disposition:  Home Discharging physician: Alm Apo, MD Barriers to discharge: none  Recommendations at discharge: Follow up with GI as planned Repeat Hgb   Home Health: PT Equipment/Devices: none  Discharge Condition: stable CODE STATUS: DNR Diet recommendation:  Diet Orders (From admission, onward)     Start     Ordered   04/05/24 0000  Diet - low sodium heart healthy        04/05/24 1439   04/04/24 1239  Diet Heart Room service appropriate? Yes; Fluid consistency: Thin  Diet effective now       Question Answer Comment  Room service appropriate? Yes   Fluid consistency: Thin      04/04/24 1238            Hospital Course: [name] is a 84 y.o. female with medical history significant for CKD stage IV, bipolar disorder, depression, hypertension, hypertension, chronic tubular adenoma, who presented to the hospital with shortness of breath.   Symptoms had been ongoing for about 3 weeks with no clear onset.  On workup on admission, she was found to have hemoglobin 8.4 g/dL.  Last known Hgb 11.5 g/dL March 7975. She was also seen in the ER on 03/15/2024 with Hgb 7.1 g/dL at that time and was transfused 1 unit PRBC.  She was set up for outpatient follow-up with GI planned for 04/14/2024. However, due to her shortness of breath, she presented back to the ER for further evaluation.  Due to concern for symptomatic anemia, she was given 1 unit PRBC on admission and on the following morning, she felt significant improvement with her dyspnea.  Posttransfusion Hgb 9.8 g/dL.  Given her symptoms, echo was also obtained which showed preserved heart function, EF 7075%, no RWMA, moderate LVH, grade 1 DD. She had no significant edema or signs of volume overload on exam.  Given significant  improvement after blood, she was felt to be stable for discharge home with ongoing planned outpatient follow-up with GI.   The patient's acute and chronic medical conditions were treated accordingly. On day of discharge, patient was felt deemed stable for discharge. Patient/family member advised to call PCP or come back to ER if needed.   Principal Diagnosis: Shortness of breath  Discharge Diagnoses: Active Hospital Problems   Diagnosis Date Noted   Symptomatic anemia 04/05/2024    Resolved Hospital Problems   Diagnosis Date Noted Date Resolved   Shortness of breath 04/04/2024 04/05/2024     Discharge Instructions     Diet - low sodium heart healthy   Complete by: As directed    Increase activity slowly   Complete by: As directed       Allergies as of 04/05/2024       Reactions   Amoxicillin Nausea And Vomiting   Clindamycin/lincomycin Nausea And Vomiting   Morphine  And Codeine Nausea And Vomiting   Other    Any pain medications. i have trouble with them.    Oxycodone  Nausea Only   Sertraline Hcl    Pt's daughter reported (01/19/20) says it sends her in to mania        Medication List     TAKE these medications    acetaminophen  325 MG tablet Commonly known as: TYLENOL  Take 650 mg by mouth every 6 (six) hours as needed for moderate  pain (pain score 4-6).   ACT Dry Mouth Lozg Use as directed 1 tablet in the mouth or throat as needed (dry mouth).   ALPRAZolam  0.25 MG tablet Commonly known as: XANAX  Take 0.25 mg by mouth daily.   amLODipine  5 MG tablet Commonly known as: NORVASC  Take 5 mg by mouth daily.   famotidine 40 MG tablet Commonly known as: PEPCID Take 40 mg by mouth daily.   famotidine 20 MG tablet Commonly known as: PEPCID Take 20 mg by mouth as needed for heartburn or indigestion.   lamoTRIgine  25 MG tablet Commonly known as: LAMICTAL  Take 25 mg by mouth 2 (two) times daily.   mirtazapine  7.5 MG tablet Commonly known as:  REMERON  Take 7.5 mg by mouth at bedtime.   pantoprazole  20 MG tablet Commonly known as: PROTONIX  Take 1 tablet (20 mg total) by mouth 2 (two) times daily.   polyethylene glycol 17 g packet Commonly known as: MIRALAX  / GLYCOLAX  Take 17 g by mouth daily.   propranolol  20 MG tablet Commonly known as: INDERAL  Take 10 mg by mouth daily.   rosuvastatin  10 MG tablet Commonly known as: CRESTOR  Take 10 mg by mouth at bedtime.   triamcinolone 0.025 % ointment Commonly known as: KENALOG Apply 1 Application topically 2 (two) times daily.   Vagifem 10 MCG Tabs vaginal tablet Generic drug: Estradiol Place 10 mcg vaginally once a week.   estradiol 0.01 % Crea vaginal cream Commonly known as: ESTRACE Place 1 g vaginally once a week.   VITAMIN D -3 PO Take 1,000 Units by mouth daily.        Allergies[1]  Consultations:   Procedures:   Discharge Exam: BP (!) 173/86   Pulse 71   Temp 97.8 F (36.6 C) (Oral)   Resp 18   Ht 5' 5 (1.651 m)   Wt 72.5 kg   SpO2 96%   BMI 26.60 kg/m  Physical Exam Constitutional:      Appearance: Normal appearance.  HENT:     Head: Normocephalic and atraumatic.     Mouth/Throat:     Mouth: Mucous membranes are moist.  Eyes:     Extraocular Movements: Extraocular movements intact.  Cardiovascular:     Rate and Rhythm: Normal rate and regular rhythm.  Pulmonary:     Effort: Pulmonary effort is normal. No respiratory distress.     Breath sounds: Normal breath sounds. No wheezing.  Abdominal:     General: Bowel sounds are normal. There is no distension.     Palpations: Abdomen is soft.     Tenderness: There is no abdominal tenderness.  Musculoskeletal:        General: Normal range of motion.     Cervical back: Normal range of motion and neck supple.  Skin:    General: Skin is warm and dry.  Neurological:     General: No focal deficit present.     Mental Status: She is alert.  Psychiatric:        Mood and Affect: Mood normal.       The results of significant diagnostics from this hospitalization (including imaging, microbiology, ancillary and laboratory) are listed below for reference.   Microbiology: No results found for this or any previous visit (from the past 240 hours).   Labs: BNP (last 3 results) No results for input(s): BNP in the last 8760 hours. Basic Metabolic Panel: Recent Labs  Lab 04/04/24 0922 04/05/24 0348  NA 141 145  K 4.2 3.5  CL 106 107  CO2 22 26  GLUCOSE 102* 105*  BUN 25* 21  CREATININE 1.81* 1.94*  CALCIUM  10.3 10.4*  MG  --  2.6*   Liver Function Tests: Recent Labs  Lab 04/04/24 0922  AST 16  ALT 9  ALKPHOS 79  BILITOT 0.3  PROT 7.0  ALBUMIN 4.2   No results for input(s): LIPASE, AMYLASE in the last 168 hours. No results for input(s): AMMONIA in the last 168 hours. CBC: Recent Labs  Lab 04/04/24 0922 04/05/24 0348  WBC 6.5 6.1  NEUTROABS 5.2  --   HGB 8.4* 9.8*  HCT 28.5* 32.6*  MCV 88.0 86.9  PLT 241 252   Cardiac Enzymes: No results for input(s): CKTOTAL, CKMB, CKMBINDEX, TROPONINI in the last 168 hours. BNP: Invalid input(s): POCBNP CBG: No results for input(s): GLUCAP in the last 168 hours. D-Dimer No results for input(s): DDIMER in the last 72 hours. Hgb A1c No results for input(s): HGBA1C in the last 72 hours. Lipid Profile No results for input(s): CHOL, HDL, LDLCALC, TRIG, CHOLHDL, LDLDIRECT in the last 72 hours. Thyroid  function studies No results for input(s): TSH, T4TOTAL, T3FREE, THYROIDAB in the last 72 hours.  Invalid input(s): FREET3 Anemia work up No results for input(s): VITAMINB12, FOLATE, FERRITIN, TIBC, IRON, RETICCTPCT in the last 72 hours. Urinalysis    Component Value Date/Time   COLORURINE COLORLESS (A) 03/15/2024 1101   APPEARANCEUR CLEAR 03/15/2024 1101   LABSPEC 1.003 (L) 03/15/2024 1101   PHURINE 7.0 03/15/2024 1101   GLUCOSEU NEGATIVE 03/15/2024 1101    HGBUR SMALL (A) 03/15/2024 1101   BILIRUBINUR NEGATIVE 03/15/2024 1101   KETONESUR NEGATIVE 03/15/2024 1101   PROTEINUR NEGATIVE 03/15/2024 1101   UROBILINOGEN 0.2 03/15/2011 1509   NITRITE NEGATIVE 03/15/2024 1101   LEUKOCYTESUR TRACE (A) 03/15/2024 1101   Sepsis Labs Recent Labs  Lab 04/04/24 0922 04/05/24 0348  WBC 6.5 6.1   Microbiology No results found for this or any previous visit (from the past 240 hours).  Procedures/Studies: ECHOCARDIOGRAM COMPLETE Result Date: 04/05/2024    ECHOCARDIOGRAM REPORT   Patient Name:   [name] Date of Exam: 04/05/2024 Medical Rec #:  [id number]     Height:       65.0 in Accession #:    [id number]    Weight:       159.8 lb Date of Birth:  Jan 20, 1941      BSA:          1.798 m Patient Age:    84 years      BP:           173/86 mmHg Patient Gender: F             HR:           76 bpm. Exam Location:  [facility] Procedure: 2D Echo, Cardiac Doppler and Color Doppler (Both Spectral and Color            Flow Doppler were utilized during procedure). Indications:    Dyspnea R06.00  History:        Patient has no prior history of Echocardiogram examinations.                 Risk Factors:Hypertension.  Sonographer:    Tinnie Gosling RDCS Referring Phys: [id number] BERNARD AYIKU IMPRESSIONS  1. Left ventricular ejection fraction, by estimation, is 70 to 75%. The left ventricle has hyperdynamic function. The left ventricle has no regional wall motion abnormalities. There is moderate concentric left ventricular hypertrophy. Left ventricular  diastolic parameters are consistent with Grade I diastolic dysfunction (impaired relaxation).  2. Right ventricular systolic function was not well visualized. The right ventricular size is not well visualized. Tricuspid regurgitation signal is inadequate for assessing PA pressure.  3. The mitral valve is grossly normal. Trivial mitral valve regurgitation.  4. The aortic valve is tricuspid. Aortic valve regurgitation is not visualized.  Aortic valve sclerosis is present, with no evidence of aortic valve stenosis.  5. Aortic dilatation noted. There is mild dilatation of the ascending aorta, measuring 42 mm.  6. Unable to estimated CVP. Comparison(s): No prior Echocardiogram. FINDINGS  Left Ventricle: Left ventricular ejection fraction, by estimation, is 70 to 75%. The left ventricle has hyperdynamic function. The left ventricle has no regional wall motion abnormalities. The left ventricular internal cavity size was normal in size. There is moderate concentric left ventricular hypertrophy. Left ventricular diastolic parameters are consistent with Grade I diastolic dysfunction (impaired relaxation). Right Ventricle: The right ventricular size is not well visualized. Right vetricular wall thickness was not assessed. Right ventricular systolic function was not well visualized. Tricuspid regurgitation signal is inadequate for assessing PA pressure. Left Atrium: Left atrial size was normal in size. Right Atrium: Right atrial size was not well visualized. Pericardium: Trivial pericardial effusion is present. The pericardial effusion is posterior to the left ventricle. Presence of epicardial fat layer. Mitral Valve: The mitral valve is grossly normal. Trivial mitral valve regurgitation. Tricuspid Valve: The tricuspid valve is grossly normal. Tricuspid valve regurgitation is trivial. Aortic Valve: The aortic valve is tricuspid. There is mild aortic valve annular calcification. Aortic valve regurgitation is not visualized. Aortic valve sclerosis is present, with no evidence of aortic valve stenosis. Pulmonic Valve: The pulmonic valve was grossly normal. Pulmonic valve regurgitation is not visualized. Aorta: Aortic dilatation noted. There is mild dilatation of the ascending aorta, measuring 42 mm. Venous: Unable to estimated CVP. The inferior vena cava was not well visualized. IAS/Shunts: No atrial level shunt detected by color flow Doppler. Additional  Comments: 3D was performed not requiring image post processing on an independent workstation and was indeterminate.  LEFT VENTRICLE PLAX 2D LVIDd:         3.60 cm     Diastology LVIDs:         1.70 cm     LV e' medial:    4.90 cm/s LV PW:         1.50 cm     LV E/e' medial:  13.4 LV IVS:        1.50 cm     LV e' lateral:   4.90 cm/s LVOT diam:     2.00 cm     LV E/e' lateral: 13.4 LV SV:         63 LV SV Index:   35 LVOT Area:     3.14 cm  LV Volumes (MOD) LV vol d, MOD A2C: 51.4 ml LV vol d, MOD A4C: 55.0 ml LV vol s, MOD A2C: 16.2 ml LV vol s, MOD A4C: 18.3 ml LV SV MOD A2C:     35.2 ml LV SV MOD A4C:     55.0 ml LV SV MOD BP:      37.9 ml RIGHT VENTRICLE RV S prime:     9.03 cm/s TAPSE (M-mode): 2.4 cm LEFT ATRIUM             Index LA diam:        3.20 cm 1.78 cm/m LA Vol (A2C):   31.7  ml 17.63 ml/m LA Vol (A4C):   56.4 ml 31.36 ml/m LA Biplane Vol: 43.7 ml 24.30 ml/m  AORTIC VALVE LVOT Vmax:   96.30 cm/s LVOT Vmean:  68.800 cm/s LVOT VTI:    0.202 m  AORTA Ao Root diam: 3.30 cm Ao Asc diam:  4.20 cm MITRAL VALVE MV Area (PHT): 3.58 cm    SHUNTS MV Decel Time: 212 msec    Systemic VTI:  0.20 m MV E velocity: 65.50 cm/s  Systemic Diam: 2.00 cm MV A velocity: 90.20 cm/s MV E/A ratio:  0.73 Jayson Sierras MD Electronically signed by Jayson Sierras MD Signature Date/Time: 04/05/2024/1:21:33 PM    Final    DG Chest Portable 1 View Result Date: 04/04/2024 EXAM: 1 VIEW(S) XRAY OF THE CHEST 04/04/2024 09:53:00 AM COMPARISON: 03/15/2024 CLINICAL HISTORY: Shortness of breath. FINDINGS: LUNGS AND PLEURA: Low lung volumes. Diffuse interstitial opacities. Left basilar patchy opacities. No pleural effusion. No pneumothorax. HEART AND MEDIASTINUM: Atherosclerotic calcifications. No acute abnormality of the cardiac and mediastinal silhouettes. BONES AND SOFT TISSUES: Degenerative changes of the left shoulder. IMPRESSION: 1. Low lung volumes with diffuse interstitial opacities which are favored to represent mild edema.  2. Left basilar patchy opacities, atelectasis or airspace disease. Electronically signed by: Waddell Calk MD 04/04/2024 10:12 AM EST RP Workstation: [id number]   DG Chest 2 View Result Date: 03/15/2024 EXAM: 2 VIEW(S) XRAY OF THE CHEST 03/15/2024 09:54:15 AM COMPARISON: 01/30/2022 CLINICAL HISTORY: Shortness of breath. FINDINGS: LUNGS AND PLEURA: Right infrahilar opacities. Bibasilar bandlike densities favoring atelectasis. Moderate hiatal hernia. No pleural effusion. No pneumothorax. HEART AND MEDIASTINUM: Aortic atherosclerotic calcification. No acute abnormality of the cardiac and mediastinal silhouettes. BONES AND SOFT TISSUES: Multilevel degenerative changes. 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 No acute osseous abnormality. IMPRESSION: 1. Right infrahilar opacities and bibasilar atelectasis. 2. Moderate hiatal hernia. 3. Aortic atherosclerotic calcification. 4. 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 Electronically signed by: Ryan Salvage MD 03/15/2024 10:18 AM EST RP Workstation: [id number]     Time coordinating discharge: Over 30 minutes    Alm Apo, MD  [facility] 04/05/2024, 2:40 PM    [1]  Allergies Allergen Reactions   Amoxicillin Nausea And Vomiting   Clindamycin/Lincomycin Nausea And Vomiting   Morphine  And Codeine Nausea And Vomiting   Other     Any pain medications. i have trouble with them.    Oxycodone  Nausea Only   Sertraline Hcl     Pt's daughter reported (01/19/20) says it sends her in to mania   "

## 2024-04-05 NOTE — TOC Transition Note (Signed)
 Transition of Care [facility]) - Discharge Note   Patient Details  Name: [name] MRN: [id number] Date of Birth: 1940/04/23  Transition of Care [facility]) CM/SW Contact:  Noreen KATHEE Pinal, LCSWA Phone Number: 04/05/2024, 2:55 PM   Clinical Narrative:     Patient is discharging back to [facility] with HHPT. CSW spoke with patient and then spouse about DC and HHPT.  They were agreeable and Shelia with [facility] was able to accept referral. Spouse is working on arranging transportation and [facility] were notified of patient returning back. CSW signing off.  Final next level of care:  (Independent Living) Barriers to Discharge: Barriers Resolved   Patient Goals and CMS Choice Patient states their goals for this hospitalization and ongoing recovery are:: Return back to the [facility].gov Compare Post Acute Care list provided to:: Patient Represenative (must comment) Choice offered to / list presented to : Spouse      Discharge Placement                Patient to be transferred to facility by: Patient Name of family member notified: Attempted to reach Spouse- Patient and family notified of of transfer: 04/05/24  Discharge Plan and Services Additional resources added to the After Visit Summary for                            [facility] Arranged: PT [facility] Agency: [facility] Date [facility] Agency Contacted: 04/05/24 Time HH Agency Contacted: 1452 Representative spoke with at [facility] Agency: [facility]  Social Drivers of Health (SDOH) Interventions SDOH Screenings   Food Insecurity: No Food Insecurity (04/04/2024)  Housing: Low Risk (04/04/2024)  Transportation Needs: No Transportation Needs (04/04/2024)  Utilities: Not At Risk (04/04/2024)  Depression (PHQ2-9): Low Risk (02/13/2023)  Financial Resource Strain: Low Risk (04/28/2023)   Received from [facility]  Physical Activity: Unknown (05/12/2022)   Received from [facility]  Social Connections: Moderately Integrated  (04/04/2024)  Stress: Stress Concern Present (05/12/2022)   Received from [facility]  Tobacco Use: Medium Risk (04/04/2024)     Readmission Risk Interventions     No data to display

## 2024-04-05 NOTE — Plan of Care (Signed)
" °  Problem: Acute Rehab PT Goals(only PT should resolve) Goal: Pt Will Go Supine/Side To Sit Outcome: Progressing Flowsheets (Taken 04/05/2024 1223) Pt will go Supine/Side to Sit: Independently Goal: Patient Will Transfer Sit To/From Stand Outcome: Progressing Flowsheets (Taken 04/05/2024 1223) Patient will transfer sit to/from stand: with modified independence Goal: Pt Will Transfer Bed To Chair/Chair To Bed Outcome: Progressing Flowsheets (Taken 04/05/2024 1223) Pt will Transfer Bed to Chair/Chair to Bed: with modified independence Goal: Pt Will Ambulate Outcome: Progressing Flowsheets (Taken 04/05/2024 1223) Pt will Ambulate:  100 feet  with modified independence  with rolling walker    12:24 PM, 04/05/24 [name], PT, DPT [place] with [facility]  "

## 2024-04-05 NOTE — Progress Notes (Addendum)
 BP elevated above the parameters for notifying Provider. Msg sent to JINNY Kipper NP. Will wait for am dose of meds.   04/05/24 0511 04/05/24 0608  Assess: MEWS Score  Temp 97.8 F (36.6 C)  --   BP (!) 172/93 (!) 173/86  MAP (mmHg) 116 111  Pulse Rate 71  --   Resp 18  --   SpO2 96 %  --   O2 Device Room Air  --

## 2024-04-05 NOTE — Care Management Obs Status (Signed)
 MEDICARE OBSERVATION STATUS NOTIFICATION   Patient Details  Name: [name] MRN: [id number] Date of Birth: [birth year]   Medicare Observation Status Notification Given:  Yes    Duwaine LITTIE Ada 04/05/2024, 2:45 PM

## 2024-04-05 NOTE — Plan of Care (Signed)
" °  Problem: Acute Rehab OT Goals (only OT should resolve) Goal: Pt. Will Perform Grooming Flowsheets (Taken 04/05/2024 1512) Pt Will Perform Grooming: with modified independence Goal: Pt. Will Perform Lower Body Dressing Flowsheets (Taken 04/05/2024 1512) Pt Will Perform Lower Body Dressing: with modified independence Goal: Pt. Will Transfer To Toilet Flowsheets (Taken 04/05/2024 1512) Pt Will Transfer to Toilet:  with modified independence  ambulating Goal: Pt. Will Perform Toileting-Clothing Manipulation Flowsheets (Taken 04/05/2024 1512) Pt Will Perform Toileting - Clothing Manipulation and hygiene: with modified independence Goal: Pt/Caregiver Will Perform Home Exercise Program Flowsheets (Taken 04/05/2024 1512) Pt/caregiver will Perform Home Exercise Program:  Increased strength  Both right and left upper extremity  Independently  Lithzy Bernard OT, MOT  "

## 2024-04-05 NOTE — Progress Notes (Signed)
" °  Echocardiogram 2D Echocardiogram has been performed.  Tinnie FORBES Gosling RDCS 04/05/2024, 1:17 PM "

## 2024-04-05 NOTE — Hospital Course (Addendum)
 LASYA VETTER is a 84 y.o. female with medical history significant for CKD stage IV, bipolar disorder, depression, hypertension, hypertension, chronic tubular adenoma, who presented to the hospital with shortness of breath.   Symptoms had been ongoing for about 3 weeks with no clear onset.  On workup on admission, she was found to have hemoglobin 8.4 g/dL.  Last known Hgb 11.5 g/dL March 7975. She was also seen in the ER on 03/15/2024 with Hgb 7.1 g/dL at that time and was transfused 1 unit PRBC.  She was set up for outpatient follow-up with GI planned for 04/14/2024. However, due to her shortness of breath, she presented back to the ER for further evaluation.  Due to concern for symptomatic anemia, she was given 1 unit PRBC on admission and on the following morning, she felt significant improvement with her dyspnea.  Posttransfusion Hgb 9.8 g/dL.  Given her symptoms, echo was also obtained which showed preserved heart function, EF 70-75%, no RWMA, moderate LVH, grade 1 DD. She had no significant edema or signs of volume overload on exam.  Given significant improvement after blood, she was felt to be stable for discharge home with ongoing planned outpatient follow-up with GI.  She was also noted to be significantly iron deficient on check on 03/15/2024.  Placing referral on this discharge to outpatient iron infusion for consideration but also has upcoming GI appointment as noted.

## 2024-04-05 NOTE — Evaluation (Signed)
 Occupational Therapy Evaluation Patient Details Name: [name] MRN: [id number] DOB: [birth year] Today's Date: 04/05/2024   History of Present Illness   [name] is an 84 y.o. female with medical history significant of hypertension, hyperlipidemia, atrial fibrillation, GERD, CVA who presents to the emergency department via EMS from [facility] due to altered mental status.  At bedside, patient was unable to provide history, history was obtained from EDP.  Per report, she 2 days of onset of worsening mental status.  Patient has about 1 month of being wheelchair-bound due to increasing right foot pain (at baseline, she was able to ambulate with a walker)..  Patient has been seen by a podiatrist for right foot pain, but otherwise no surgical options for her.  She had Unna boot which was placed about a week ago. (per MD)     Clinical Impressions Pt agreeable to OT and PT co-evaluation. Pt needing no physical assist for bed mobility. CGA to min A for functional transfers with RW. B Ue generally weak with possible bursitis noted on R elbow with elbow extension weakness. Pt educated that she may want to talk to her PCP about this. Pt left in the chair with call bell within reach. Pt will benefit from continued OT in the hospital to increase strength, balance, and endurance for safe ADL's.        If plan is discharge home, recommend the following:   A little help with walking and/or transfers;A little help with bathing/dressing/bathroom;Assistance with cooking/housework;Assist for transportation;Help with stairs or ramp for entrance     Functional Status Assessment   Patient has had a recent decline in their functional status and demonstrates the ability to make significant improvements in function in a reasonable and predictable amount of time.     Equipment Recommendations   None recommended by OT             Precautions/Restrictions   Precautions Precautions:  Fall Recall of Precautions/Restrictions: Intact Restrictions Weight Bearing Restrictions Per Provider Order: No     Mobility Bed Mobility Overal bed mobility: Independent             General bed mobility comments: No physical assist needed.    Transfers Overall transfer level: Needs assistance Equipment used: Rolling walker (2 wheels) Transfers: Sit to/from Stand, Bed to chair/wheelchair/BSC Sit to Stand: Min assist, Contact guard assist     Step pivot transfers: Contact guard assist, Supervision     General transfer comment: Labored effort for sit to stand from chair, bed, and toilet.      Balance Overall balance assessment: Needs assistance Sitting-balance support: No upper extremity supported, Feet supported Sitting balance-Leahy Scale: Good Sitting balance - Comments: Seated EOB   Standing balance support: During functional activity, Reliant on assistive device for balance, Bilateral upper extremity supported Standing balance-Leahy Scale: Fair Standing balance comment: with RW                           ADL either performed or assessed with clinical judgement   ADL Overall ADL's : Needs assistance/impaired     Grooming: Set up;Sitting   Upper Body Bathing: Set up;Sitting   Lower Body Bathing: Contact guard assist;Set up;Sitting/lateral leans   Upper Body Dressing : Set up;Sitting   Lower Body Dressing: Set up;Contact guard assist;Sitting/lateral leans Lower Body Dressing Details (indicate cue type and reason): labored effort to don socks and shoes at EOB; extended time needed. Toilet Transfer: Contact  guard assist;Minimal assistance;Rolling walker (2 wheels);Ambulation Toilet Transfer Details (indicate cue type and reason): Ambulatory to toilet with RW. Toileting- Clothing Manipulation and Hygiene: Supervision/safety;Sitting/lateral lean       Functional mobility during ADLs: Contact guard assist;Rolling walker (2 wheels) General ADL  Comments: Able to ambulate a short distance with RW.     Vision Baseline Vision/History: 1 Wears glasses Ability to See in Adequate Light: 0 Adequate Patient Visual Report: No change from baseline Vision Assessment?: No apparent visual deficits     Perception Perception: Not tested       Praxis Praxis: Not tested       Pertinent Vitals/Pain Pain Assessment Pain Assessment: No/denies pain     Extremity/Trunk Assessment Upper Extremity Assessment Upper Extremity Assessment: Generalized weakness;RUE deficits/detail RUE Deficits / Details: 3-/5 MMT bilaterally due to lack of full shoulder range. Pt also presents with a R elbow swelling, possibly bursitis along with R elbow weakenss at 3+/5 for elbow extension.   Lower Extremity Assessment Lower Extremity Assessment: Defer to PT evaluation   Cervical / Trunk Assessment Cervical / Trunk Assessment: Kyphotic   Communication Communication Communication: Impaired Factors Affecting Communication: Hearing impaired;Other (comment)   Cognition Arousal: Alert Behavior During Therapy: WFL for tasks assessed/performed Cognition: No apparent impairments                               Following commands: Intact       Cueing  General Comments   Cueing Techniques: Verbal cues;Tactile cues;Visual cues                 Home Living Family/patient expects to be discharged to:: Assisted living                             Home Equipment: Rolling Walker (2 wheels)          Prior Functioning/Environment Prior Level of Function : Independent/Modified Independent             Mobility Comments: Pt reports she ambualtes around facility with RW and supervision ADLs Comments: Pt reports being independent for ADL's.    OT Problem List: Decreased strength;Decreased activity tolerance;Impaired balance (sitting and/or standing)   OT Treatment/Interventions: Self-care/ADL training;Therapeutic  exercise;Therapeutic activities;Patient/family education;Balance training      OT Goals(Current goals can be found in the care plan section)   Acute Rehab OT Goals Patient Stated Goal: Return home. OT Goal Formulation: With patient Time For Goal Achievement: 04/19/24 Potential to Achieve Goals: Good   OT Frequency:  Min 1X/week    Co-evaluation PT/OT/SLP Co-Evaluation/Treatment: Yes Reason for Co-Treatment: To address functional/ADL transfers PT goals addressed during session: Mobility/safety with mobility OT goals addressed during session: ADL's and self-care      AM-PAC OT 6 Clicks Daily Activity     Outcome Measure Help from another person eating meals?: None Help from another person taking care of personal grooming?: A Little Help from another person toileting, which includes using toliet, bedpan, or urinal?: A Little Help from another person bathing (including washing, rinsing, drying)?: A Little Help from another person to put on and taking off regular upper body clothing?: A Little Help from another person to put on and taking off regular lower body clothing?: A Little 6 Click Score: 19   End of Session Equipment Utilized During Treatment: Rolling walker (2 wheels)  Activity Tolerance: Patient tolerated treatment well Patient left:  in chair;with call bell/phone within reach  OT Visit Diagnosis: Unsteadiness on feet (R26.81);Other abnormalities of gait and mobility (R26.89);Muscle weakness (generalized) (M62.81)                Time: 9153-9078 OT Time Calculation (min): 35 min Charges:  OT General Charges $OT Visit: 1 Visit OT Evaluation $OT Eval Low Complexity: 1 Low  Rilla Buckman OT, MOT  Jayson Person 04/05/2024, 3:10 PM

## 2024-04-05 NOTE — Plan of Care (Signed)
 Pt is alert and oriented x 4 but forgetful. Needing frequent reminding of care and how to use call button. Forgets to call for assistance and gets up to use bathroom and setting fall alarm off. Frequent urination approx every to 1 hour. Voiding approx 300 each time. Received lasix  IV on dayshift.  Problem: Elimination: Goal: Will not experience complications related to urinary retention Outcome: Not Progressing   Problem: Education: Goal: Knowledge of General Education information will improve Description: Including pain rating scale, medication(s)/side effects and non-pharmacologic comfort measures Outcome: Progressing   Problem: Health Behavior/Discharge Planning: Goal: Ability to manage health-related needs will improve Outcome: Progressing   Problem: Clinical Measurements: Goal: Ability to maintain clinical measurements within normal limits will improve Outcome: Progressing Goal: Will remain free from infection Outcome: Progressing Goal: Diagnostic test results will improve Outcome: Progressing Goal: Respiratory complications will improve Outcome: Progressing Goal: Cardiovascular complication will be avoided Outcome: Progressing   Problem: Activity: Goal: Risk for activity intolerance will decrease Outcome: Progressing   Problem: Nutrition: Goal: Adequate nutrition will be maintained Outcome: Progressing   Problem: Coping: Goal: Level of anxiety will decrease Outcome: Progressing   Problem: Elimination: Goal: Will not experience complications related to bowel motility Outcome: Progressing   Problem: Pain Managment: Goal: General experience of comfort will improve and/or be controlled Outcome: Progressing   Problem: Safety: Goal: Ability to remain free from injury will improve Outcome: Progressing   Problem: Skin Integrity: Goal: Risk for impaired skin integrity will decrease Outcome: Progressing

## 2024-04-06 ENCOUNTER — Telehealth (HOSPITAL_COMMUNITY): Payer: Self-pay | Admitting: Internal Medicine

## 2024-04-06 ENCOUNTER — Telehealth: Payer: Self-pay

## 2024-04-06 DIAGNOSIS — D649 Anemia, unspecified: Secondary | ICD-10-CM | POA: Insufficient documentation

## 2024-04-06 NOTE — Telephone Encounter (Signed)
 Patient referred to infusion pharmacy team for ambulatory infusion of IV iron.  Insurance - Medicare Site of care - Site of care: [facility] Dx code - D64.9 IV Iron Therapy - Feraheme 510 mg x2   Infusion appointments - Scheduling team will schedule patient as soon as possible.    Lorane Dross, PharmD Infusion Center Clinical Pharmacist

## 2024-04-06 NOTE — Telephone Encounter (Signed)
 Auth Submission: NO AUTH NEEDED Site of care: Site of care: [facility] Payer: medicare a/b, bcbs supp Medication & CPT/J Code(s) submitted: Feraheme (ferumoxytol ) [id number] Diagnosis Code:  Route of submission (phone, fax, portal): Portal Phone # Fax # Auth type: Buy/Bill HB Units/visits requested: 510mg  x 2 doses Reference number:  Approval from: 04/06/24 to 03/09/25

## 2024-04-11 ENCOUNTER — Encounter (INDEPENDENT_AMBULATORY_CARE_PROVIDER_SITE_OTHER): Payer: Self-pay

## 2024-04-13 ENCOUNTER — Encounter: Admitting: Emergency Medicine

## 2024-04-13 ENCOUNTER — Ambulatory Visit: Payer: Medicare Other | Admitting: Physician Assistant

## 2024-04-13 ENCOUNTER — Ambulatory Visit
Admission: EM | Admit: 2024-04-13 | Discharge: 2024-04-13 | Discharge status: Against Medical Advice | Source: Home / Self Care

## 2024-04-13 VITALS — BP 168/85 | HR 75 | Temp 98.3°F | Resp 16

## 2024-04-13 DIAGNOSIS — D649 Anemia, unspecified: Secondary | ICD-10-CM

## 2024-04-13 DIAGNOSIS — D631 Anemia in chronic kidney disease: Secondary | ICD-10-CM

## 2024-04-13 MED ORDER — SODIUM CHLORIDE 0.9 % IV SOLN
510.0000 mg | Freq: Once | INTRAVENOUS | Status: AC
  Administered 2024-04-13: 510 mg via INTRAVENOUS
  Filled 2024-04-13: qty 17

## 2024-04-13 MED ORDER — ACETAMINOPHEN 325 MG PO TABS
650.0000 mg | ORAL_TABLET | Freq: Once | ORAL | Status: AC
  Administered 2024-04-13: 650 mg via ORAL

## 2024-04-13 MED ORDER — DIPHENHYDRAMINE HCL 25 MG PO CAPS
25.0000 mg | ORAL_CAPSULE | Freq: Once | ORAL | Status: AC
  Administered 2024-04-13: 25 mg via ORAL

## 2024-04-13 NOTE — ED Notes (Addendum)
 Brought patient back for triage, discussed wait time and reasons for delay. Pt reported had an appointment this afternoon and does not wish to continue waiting at this time. Pt reported would make appointment for tomorrow. NAD noted. Pt left prior to triage.

## 2024-04-13 NOTE — Progress Notes (Signed)
 Diagnosis: Iron Deficiency Anemia  Provider:  Alm Apo   Procedure: IV Infusion  IV Type: Peripheral, IV Location: Left Wrist  Feraheme (Ferumoxytol ), Dose: 510 mg  Infusion Start Time: 1438  Infusion Stop Time: 1454  Post Infusion IV Care: Observation period completed and Peripheral IV Discontinued  Discharge: Condition: Good, Destination: Home . AVS Declined  Performed by:  Delon ONEIDA Officer, RN

## 2024-04-14 ENCOUNTER — Ambulatory Visit

## 2024-04-14 ENCOUNTER — Ambulatory Visit: Admitting: Internal Medicine

## 2024-04-20 ENCOUNTER — Ambulatory Visit
